# Patient Record
Sex: Female | Born: 1945 | Race: White | Hispanic: No | Smoking: Current every day smoker
Health system: Southern US, Community
[De-identification: ages and names within clinical notes are randomized; demographics above are authoritative.]

## PROBLEM LIST (undated history)

## (undated) DIAGNOSIS — G4700 Insomnia, unspecified: Secondary | ICD-10-CM

## (undated) DIAGNOSIS — K219 Gastro-esophageal reflux disease without esophagitis: Secondary | ICD-10-CM

## (undated) DIAGNOSIS — E079 Disorder of thyroid, unspecified: Secondary | ICD-10-CM

## (undated) DIAGNOSIS — E785 Hyperlipidemia, unspecified: Secondary | ICD-10-CM

## (undated) DIAGNOSIS — M810 Age-related osteoporosis without current pathological fracture: Secondary | ICD-10-CM

## (undated) DIAGNOSIS — R32 Unspecified urinary incontinence: Secondary | ICD-10-CM

## (undated) DIAGNOSIS — I10 Essential (primary) hypertension: Secondary | ICD-10-CM

## (undated) DIAGNOSIS — R55 Syncope and collapse: Secondary | ICD-10-CM

## (undated) DIAGNOSIS — I2510 Atherosclerotic heart disease of native coronary artery without angina pectoris: Secondary | ICD-10-CM

## (undated) HISTORY — DX: Atherosclerotic heart disease of native coronary artery without angina pectoris: I25.10

## (undated) HISTORY — DX: Essential (primary) hypertension: I10

## (undated) HISTORY — DX: Unspecified urinary incontinence: R32

## (undated) HISTORY — PX: OTHER SURGICAL HISTORY: SHX169

## (undated) HISTORY — DX: Insomnia, unspecified: G47.00

## (undated) HISTORY — DX: Syncope and collapse: R55

## (undated) HISTORY — DX: Hyperlipidemia, unspecified: E78.5

## (undated) HISTORY — DX: Age-related osteoporosis without current pathological fracture: M81.0

## (undated) HISTORY — PX: ABDOMINAL HYSTERECTOMY: SHX81

## (undated) HISTORY — DX: Disorder of thyroid, unspecified: E07.9

## (undated) HISTORY — PX: VESICOVAGINAL FISTULA CLOSURE W/ TAH: SUR271

## (undated) HISTORY — DX: Gastro-esophageal reflux disease without esophagitis: K21.9

## (undated) HISTORY — PX: THYROID SURGERY: SHX805

---

## 1999-05-09 ENCOUNTER — Other Ambulatory Visit: Admission: RE | Admit: 1999-05-09 | Discharge: 1999-05-09 | Payer: Self-pay | Admitting: Family Medicine

## 2000-11-19 ENCOUNTER — Other Ambulatory Visit: Admission: RE | Admit: 2000-11-19 | Discharge: 2000-11-19 | Payer: Self-pay | Admitting: Family Medicine

## 2001-03-28 ENCOUNTER — Ambulatory Visit (HOSPITAL_COMMUNITY): Admission: RE | Admit: 2001-03-28 | Discharge: 2001-03-28 | Payer: Self-pay | Admitting: Family Medicine

## 2001-03-28 ENCOUNTER — Encounter: Payer: Self-pay | Admitting: Family Medicine

## 2002-05-29 ENCOUNTER — Other Ambulatory Visit: Admission: RE | Admit: 2002-05-29 | Discharge: 2002-05-29 | Payer: Self-pay | Admitting: Family Medicine

## 2002-10-01 ENCOUNTER — Encounter: Payer: Self-pay | Admitting: Orthopedic Surgery

## 2002-10-01 ENCOUNTER — Encounter: Admission: RE | Admit: 2002-10-01 | Discharge: 2002-10-01 | Payer: Self-pay | Admitting: Orthopedic Surgery

## 2005-05-29 ENCOUNTER — Ambulatory Visit: Payer: Self-pay | Admitting: Cardiology

## 2005-05-31 ENCOUNTER — Ambulatory Visit: Payer: Self-pay | Admitting: Cardiology

## 2005-06-11 ENCOUNTER — Ambulatory Visit: Payer: Self-pay | Admitting: Internal Medicine

## 2005-06-19 ENCOUNTER — Ambulatory Visit: Payer: Self-pay | Admitting: Internal Medicine

## 2005-06-19 ENCOUNTER — Ambulatory Visit (HOSPITAL_COMMUNITY): Admission: RE | Admit: 2005-06-19 | Discharge: 2005-06-19 | Payer: Self-pay | Admitting: Internal Medicine

## 2005-07-11 ENCOUNTER — Ambulatory Visit: Payer: Self-pay | Admitting: Cardiology

## 2006-11-13 ENCOUNTER — Encounter: Admission: RE | Admit: 2006-11-13 | Discharge: 2006-11-13 | Payer: Self-pay | Admitting: Family Medicine

## 2006-11-19 ENCOUNTER — Other Ambulatory Visit: Admission: RE | Admit: 2006-11-19 | Discharge: 2006-11-19 | Payer: Self-pay | Admitting: Family Medicine

## 2010-11-17 NOTE — Cardiovascular Report (Signed)
Kristen, Munoz                  ACCOUNT NO.:  [id number]   MEDICAL RECORD NO.:  [id number]          PATIENT TYPE:  OIB   LOCATION:  2899                         FACILITY:  [facility]   PHYSICIAN:  Arvilla Meres, M.D. LHCDATE OF BIRTH:  Aug 09, 1945   DATE OF PROCEDURE:  06/19/2005  DATE OF DISCHARGE:  06/19/2005                              CARDIAC CATHETERIZATION   PRIMARY CARE PHYSICIAN:  Dr. Rudi Heap.   CARDIOLOGISTOccupational hygienist in [place].   PATIENT IDENTIFICATION:  Kristen Munoz is a 65 year old woman with a history of  hyperlipidemia and ongoing tobacco use as well as strong family history of  coronary artery disease who was seen in the [facility] office for episodes  of chest pain related to emotional stress. She does get some shortness of  breath with exertion but does not get chest pain with exertion. Typically  chest pain episodes can last up to 24 hours at a time. She underwent  Cardiolite which showed a question of an ischemic defect in the inferior  wall and she is thus referred for cardiac catheterization.   PROCEDURES PERFORMED:  1.  Selective coronary angiography.  2.  Left heart catheterization.  3.  Left ventriculogram.   DESCRIPTION OF PROCEDURE:  The risks and benefits of the catheterization  explained to Ms. Greer; consent was signed and placed on the chart. A 6-  [place] arterial sheath was placed in the right femoral artery using a  modified Seldinger technique. Standard catheters including preformed Judkins  JL-4, JR-4 and angled pigtail were used for the procedure. All catheter  exchanges made over wire. There were no apparent complications.   Central aortic pressure was 122/67 with a mean of 91. LV pressure is 121/6  with an LVEDP of 15. There was no aortic stenosis.   Left main was normal.   LAD was a long vessel that wrapped the apex. It gave off a very large  branching diagonal on the proximal portion. In the LAD proper, there was a  30%  stenosis in the proximal portion before the take off the diagonal. In  the mid-section, there was a 50-60% focal stenosis which was not flow-  limiting.   Left circumflex was a small system made up primarily of an OM-1. There was  40% focal stenosis in the OM-1.   Right coronary artery was a very large dominant vessel. It gave off a right  ventricular branch, moderate-to-large PDA and 5 or 6 small to moderate PL's.  There was a 30% lesion in the mid-section of the RCA; otherwise no  significant angiographic CAD.   Left ventriculogram done in the RAO approach showed an EF of 55-60% with no  focal wall motion abnormalities. There was no mitral regurgitation.   ASSESSMENT:  1.  Nonobstructive coronary disease.  2.  Normal left ventricular function.   PLAN/DISCUSSION:  She will need aggressive risk factor management including  smoking cessation. We will start her on simvastatin 40 milligrams a day. She  will follow back up in the [place] office. She will likely be able to be  discharged home today after her bedrest.      Arvilla Meres, M.D. [facility]  Electronically Signed     DB/MEDQ  D:  06/19/2005  T:  06/20/2005  Job:  [id number]   cc:   Ernestina Penna, M.D.  Fax: [phone]   [facility]  [place], [place]

## 2012-09-14 ENCOUNTER — Other Ambulatory Visit: Payer: Self-pay | Admitting: *Deleted

## 2012-09-14 DIAGNOSIS — M810 Age-related osteoporosis without current pathological fracture: Secondary | ICD-10-CM

## 2012-09-29 ENCOUNTER — Other Ambulatory Visit: Payer: Self-pay | Admitting: Nurse Practitioner

## 2012-09-29 DIAGNOSIS — R928 Other abnormal and inconclusive findings on diagnostic imaging of breast: Secondary | ICD-10-CM

## 2012-10-29 ENCOUNTER — Ambulatory Visit (INDEPENDENT_AMBULATORY_CARE_PROVIDER_SITE_OTHER): Payer: Medicare Other

## 2012-10-29 ENCOUNTER — Ambulatory Visit (INDEPENDENT_AMBULATORY_CARE_PROVIDER_SITE_OTHER): Payer: Medicare Other | Admitting: Pharmacist

## 2012-10-29 VITALS — Ht 63.0 in | Wt 152.0 lb

## 2012-10-29 DIAGNOSIS — F32A Depression, unspecified: Secondary | ICD-10-CM | POA: Insufficient documentation

## 2012-10-29 DIAGNOSIS — E039 Hypothyroidism, unspecified: Secondary | ICD-10-CM

## 2012-10-29 DIAGNOSIS — M810 Age-related osteoporosis without current pathological fracture: Secondary | ICD-10-CM

## 2012-10-29 DIAGNOSIS — I10 Essential (primary) hypertension: Secondary | ICD-10-CM | POA: Insufficient documentation

## 2012-10-29 DIAGNOSIS — E785 Hyperlipidemia, unspecified: Secondary | ICD-10-CM | POA: Insufficient documentation

## 2012-10-29 DIAGNOSIS — K219 Gastro-esophageal reflux disease without esophagitis: Secondary | ICD-10-CM | POA: Insufficient documentation

## 2012-10-29 DIAGNOSIS — I2510 Atherosclerotic heart disease of native coronary artery without angina pectoris: Secondary | ICD-10-CM | POA: Insufficient documentation

## 2012-10-29 DIAGNOSIS — N393 Stress incontinence (female) (male): Secondary | ICD-10-CM

## 2012-10-29 DIAGNOSIS — F329 Major depressive disorder, single episode, unspecified: Secondary | ICD-10-CM

## 2012-10-29 NOTE — Progress Notes (Signed)
Patient ID: Kristen Munoz, female   DOB: 25-Mar-1946, 67 y.o.   MRN: [id number]  [facility] Current Height: Height: 5\' 3"  (160 cm)      Max Lifetime Height:  5' 3.5" Current Weight: Weight: 152 lb (68.947 kg)       Body mass index is 26.93 kg/(m^2).    Ethnicity:Caucasian    HPI: Does pt already have a diagnosis of:  Osteopenia?  No Osteoporosis?  Yes  Back Pain?  Yes       Kyphosis?  No Prior fracture?  No Med(s) for Osteoporosis/Osteopenia:  none Med(s) previously tried for Osteoporosis/Osteopenia:  Calcium - severe constipation,                                         Fosamax - constipation                                        Boniva 150mg  q month - worsened GERD                                                              PMH: Age at menopause:  Surgical in 1976 Hysterectomy?  Yes Oophorectomy?  No HRT? Yes - Former.  Type/duration: estrogen Steroid Use?  No Thyroid med?  Yes History of cancer?  No  History of digestive disorders (ie Crohn's)?  Yes - GERD, currently taking PPI Current or previous eating disorders?  No Last Vitamin D Result:  32.4 (06/210) Last GFR Result:  78 (01/15/214)   FH/SH: Family history of osteoporosis?  Yes - mother Parent with history of hip fracture?  No Family history of breast cancer?  No Exercise?  Yes -walking 1 -2 days per week Smoking?  Yes  - has tried gum and patches to quit - successful for a few months but then restarted.  Alcohol?  No    Calcium Assessment Calcium Intake  # of servings/day  Calcium mg  Milk (8 oz) 0  x  300  = 0  Yogurt (4 oz) 0.5 x  200 = 100mg   Cheese (1 oz) 1 x  200 = 200mg   Other Calcium sources   250mg   Ca supplement none = 0   Estimated calcium intake per day 550mg     DEXA Results Date of Test T-Score for AP Spine L1-L4 T-Score for Total Left Hip T-Score for Total Right Hip  10/29/2012 -1.8 -1.9 -2.0  12/15/2008 -2.5 -1.7 -1.8  10/14/2006 -2.6 -1.9 -2.1  02/24/1998 -2.0 -1.5 --       Assessment: Osteoporosis with improved BMD of spine but decrease in both hips.  Non compliant with medication due to side effects.  High fracture risk and low calcium intake  Recommendations: 1.  Start  Prolia 60mg  SQ q 6 months - checking into insurance coverage - pending 2.  recommend calcium 1200mg  daily either through supplementation   or diet.   3.  recommend weight bearing exercise - 30 minutes at least 4 days   per week.   4.  Counseled and educated about fall risk and prevention. 5.  Discussed  smoking cessation - patient is not currently prepared to quit but we did discuss come option for helping quit.  Recheck DEXA:  2 years  Time spent counseling patient:  30 minutes

## 2012-10-29 NOTE — Patient Instructions (Addendum)

## 2012-11-26 ENCOUNTER — Telehealth: Payer: Self-pay

## 2012-11-26 NOTE — Telephone Encounter (Signed)
Insurance verification was sent to [employer] - they said prolia not covered by patient's plan.  I have discussed with Rhonda Case in our insurance dept and she is checking on coverage.  Patient notified to give Kristen Munoz 1 more week to see if Prolia is covered.

## 2012-11-26 NOTE — Telephone Encounter (Signed)
Patient said she was expecting a call back whether she is able to get shots for her bones regarding her Dexascan

## 2012-11-27 ENCOUNTER — Other Ambulatory Visit: Payer: Self-pay | Admitting: Family Medicine

## 2012-11-27 DIAGNOSIS — R921 Mammographic calcification found on diagnostic imaging of breast: Secondary | ICD-10-CM

## 2012-12-22 ENCOUNTER — Ambulatory Visit
Admission: RE | Admit: 2012-12-22 | Discharge: 2012-12-22 | Disposition: A | Discharge status: Home / Self Care | Payer: Medicare Other | Source: Ambulatory Visit | Attending: Family Medicine | Admitting: Family Medicine

## 2012-12-22 DIAGNOSIS — R921 Mammographic calcification found on diagnostic imaging of breast: Secondary | ICD-10-CM

## 2012-12-24 ENCOUNTER — Telehealth: Payer: Self-pay | Admitting: Pharmacist

## 2012-12-24 NOTE — Telephone Encounter (Signed)
Per [facility] (ref [phone]) cost of prolia will be 20% of cost plus office visit copay.  Patient notified and wants to proceed with Prolia.

## 2012-12-25 NOTE — Telephone Encounter (Signed)
Prolia ordered I will let you know when it comes in. 

## 2012-12-29 ENCOUNTER — Other Ambulatory Visit (INDEPENDENT_AMBULATORY_CARE_PROVIDER_SITE_OTHER): Payer: Medicare Other | Admitting: Pharmacist

## 2012-12-29 ENCOUNTER — Encounter: Payer: Self-pay | Admitting: Nurse Practitioner

## 2012-12-29 ENCOUNTER — Ambulatory Visit (INDEPENDENT_AMBULATORY_CARE_PROVIDER_SITE_OTHER): Payer: Medicare Other | Admitting: Nurse Practitioner

## 2012-12-29 VITALS — BP 113/72 | HR 68 | Temp 97.1°F | Ht 63.0 in | Wt 150.0 lb

## 2012-12-29 DIAGNOSIS — M810 Age-related osteoporosis without current pathological fracture: Secondary | ICD-10-CM

## 2012-12-29 DIAGNOSIS — I10 Essential (primary) hypertension: Secondary | ICD-10-CM

## 2012-12-29 DIAGNOSIS — F329 Major depressive disorder, single episode, unspecified: Secondary | ICD-10-CM

## 2012-12-29 DIAGNOSIS — K219 Gastro-esophageal reflux disease without esophagitis: Secondary | ICD-10-CM

## 2012-12-29 DIAGNOSIS — I2510 Atherosclerotic heart disease of native coronary artery without angina pectoris: Secondary | ICD-10-CM

## 2012-12-29 DIAGNOSIS — E039 Hypothyroidism, unspecified: Secondary | ICD-10-CM

## 2012-12-29 DIAGNOSIS — E785 Hyperlipidemia, unspecified: Secondary | ICD-10-CM

## 2012-12-29 MED ORDER — DENOSUMAB 60 MG/ML ~~LOC~~ SOLN
60.0000 mg | Freq: Once | SUBCUTANEOUS | Status: AC
  Administered 2012-12-29: 60 mg via SUBCUTANEOUS

## 2012-12-29 NOTE — Progress Notes (Signed)
Subjective:    Patient ID: Kristen Munoz, female    DOB: July 26, 1945, 67 y.o.   MRN: [id number]  Hyperlipidemia This is a chronic problem. The current episode started more than 1 year ago. The problem is controlled. Recent lipid tests were reviewed and are normal. There are no known factors aggravating her hyperlipidemia. Pertinent negatives include no chest pain, focal weakness, leg pain, myalgias or shortness of breath. Current antihyperlipidemic treatment includes statins. The current treatment provides moderate improvement of lipids. Risk factors for coronary artery disease include hypertension and post-menopausal.  Hypertension This is a chronic problem. The current episode started more than 1 year ago. The problem has been resolved since onset. The problem is controlled. Pertinent negatives include no chest pain, malaise/fatigue, neck pain, orthopnea, palpitations, peripheral edema, shortness of breath or sweats. There are no associated agents to hypertension. Risk factors for coronary artery disease include post-menopausal state. Past treatments include ACE inhibitors and diuretics. The current treatment provides significant improvement. There are no compliance problems.  Hypertensive end-organ damage includes a thyroid problem.  Thyroid Problem Presents for follow-up (hypothyroidism) visit. Patient reports no anxiety, depressed mood, diaphoresis, diarrhea, dry skin, menstrual problem, palpitations or weight gain. The symptoms have been stable. Her past medical history is significant for hyperlipidemia.  Depression Zoloft working well without side effects- Keeps her calm and from worrying so much GERD Zegrid- works well - keeps symptoms under control Osteoporosis Suppose to get a prolia injection today   Review of Systems  Constitutional: Negative for weight gain, malaise/fatigue and diaphoresis.  HENT: Negative for neck pain.   Respiratory: Negative for shortness of breath.    Cardiovascular: Negative for chest pain, palpitations and orthopnea.  Gastrointestinal: Negative for diarrhea.  Genitourinary: Negative for menstrual problem.  Musculoskeletal: Negative for myalgias.  Neurological: Negative for focal weakness.  All other systems reviewed and are negative.       Objective:   Physical Exam  Constitutional: She is oriented to person, place, and time. She appears well-developed and well-nourished.  HENT:  Nose: Nose normal.  Mouth/Throat: Oropharynx is clear and moist.  Eyes: EOM are normal.  Neck: Trachea normal, normal range of motion and full passive range of motion without pain. Neck supple. No JVD present. Carotid bruit is not present. No thyromegaly present.  Cardiovascular: Normal rate, regular rhythm, normal heart sounds and intact distal pulses.  Exam reveals no gallop and no friction rub.   No murmur heard. Pulmonary/Chest: Effort normal and breath sounds normal.  Abdominal: Soft. Bowel sounds are normal. She exhibits no distension and no mass. There is no tenderness.  Musculoskeletal: Normal range of motion.  Lymphadenopathy:    She has no cervical adenopathy.  Neurological: She is alert and oriented to person, place, and time. She has normal reflexes.  Skin: Skin is warm and dry.  Psychiatric: She has a normal mood and affect. Her behavior is normal. Judgment and thought content normal.    BP 113/72  Pulse 68  Temp(Src) 97.1 F (36.2 C) (Oral)  Ht 5\' 3"  (1.6 m)  Wt 150 lb (68.04 kg)  BMI 26.58 kg/m2       Assessment & Plan:   1. HTN (hypertension)   2. Other and unspecified hyperlipidemia   3. Unspecified hypothyroidism   4. Osteoporosis   5. Depression   6. CAD (coronary artery disease)   7. GERD (gastroesophageal reflux disease)    Orders Placed This Encounter  Procedures  . Thyroid Panel With TSH  . NMR  Lipoprofile with Lipids  . COMPLETE METABOLIC PANEL WITH GFR   Outpatient Encounter Prescriptions as of  12/29/2012  Medication Sig Dispense Refill  . aspirin EC 81 MG tablet Take 81 mg by mouth daily.      . cholecalciferol (VITAMIN D) 1000 UNITS tablet Take 1,000 Units by mouth daily.      Marland Kitchen levothyroxine (SYNTHROID, LEVOTHROID) 25 MCG tablet Take 25 mcg by mouth daily before breakfast.      . lisinopril-hydrochlorothiazide (PRINZIDE,ZESTORETIC) 10-12.5 MG per tablet Take 1 tablet by mouth daily.      Marland Kitchen omeprazole-sodium bicarbonate (ZEGERID) 40-1100 MG per capsule Take 1 capsule by mouth daily.      . sertraline (ZOLOFT) 100 MG tablet Take 100 mg by mouth daily.      . simvastatin (ZOCOR) 40 MG tablet Take 40 mg by mouth every evening.      . vitamin B-12 (CYANOCOBALAMIN) 1000 MCG tablet Take 1,000 mcg by mouth daily.      . [DISCONTINUED] esomeprazole (NEXIUM) 40 MG capsule Take 40 mg by mouth daily before breakfast.       No facility-administered encounter medications on file as of 12/29/2012.   Continue all meds Labs pending Diet and exercise discussed Follow up in 3 months  Mary-Margaret Daphine Deutscher, FNP

## 2012-12-29 NOTE — Telephone Encounter (Signed)
Prolia in the lab refrigerator

## 2012-12-29 NOTE — Patient Instructions (Signed)

## 2012-12-30 LAB — COMPLETE METABOLIC PANEL WITH GFR
ALT: 9 U/L (ref 0–35)
Alkaline Phosphatase: 83 U/L (ref 39–117)
CO2: 28 mEq/L (ref 19–32)
Creat: 0.9 mg/dL (ref 0.50–1.10)
GFR, Est African American: 77 mL/min
Sodium: 140 mEq/L (ref 135–145)
Total Bilirubin: 0.4 mg/dL (ref 0.3–1.2)
Total Protein: 7.2 g/dL (ref 6.0–8.3)

## 2012-12-30 LAB — THYROID PANEL WITH TSH
Free Thyroxine Index: 2.9 (ref 1.0–3.9)
T3 Uptake: 29.5 % (ref 22.5–37.0)
TSH: 1.187 u[IU]/mL (ref 0.350–4.500)

## 2012-12-31 LAB — NMR LIPOPROFILE WITH LIPIDS
HDL Particle Number: 32.2 umol/L (ref >=30.5)
HDL Size: 9.1 nm — ABNORMAL LOW (ref >=9.2)
HDL-C: 53 mg/dL (ref >=40)
LDL (calc): 91 mg/dL (ref <100)
LDL Particle Number: 1460 nmol/L — ABNORMAL HIGH (ref <1000)
LDL Size: 20.9 nm (ref >20.5)
LP-IR Score: 50 — ABNORMAL HIGH (ref <=45)
Small LDL Particle Number: 673 nmol/L — ABNORMAL HIGH (ref <=527)
VLDL Size: 45.3 nm (ref <=46.6)

## 2013-01-13 ENCOUNTER — Other Ambulatory Visit: Payer: Self-pay | Admitting: Nurse Practitioner

## 2013-01-21 ENCOUNTER — Ambulatory Visit (INDEPENDENT_AMBULATORY_CARE_PROVIDER_SITE_OTHER): Payer: Medicare Other

## 2013-01-21 ENCOUNTER — Encounter: Payer: Self-pay | Admitting: Family Medicine

## 2013-01-21 ENCOUNTER — Ambulatory Visit (INDEPENDENT_AMBULATORY_CARE_PROVIDER_SITE_OTHER): Payer: Medicare Other | Admitting: Family Medicine

## 2013-01-21 VITALS — BP 132/66 | HR 74 | Temp 98.0°F | Ht 63.0 in | Wt 152.0 lb

## 2013-01-21 DIAGNOSIS — M25511 Pain in right shoulder: Secondary | ICD-10-CM

## 2013-01-21 DIAGNOSIS — M25519 Pain in unspecified shoulder: Secondary | ICD-10-CM

## 2013-01-21 MED ORDER — MELOXICAM 7.5 MG PO TABS: 7.5000 mg | ORAL_TABLET | Freq: Every day | ORAL | Status: DC

## 2013-01-21 NOTE — Assessment & Plan Note (Addendum)
-   Plain film reviewed and no evidence of arthritis or acute process.   Rotator cuff tendonopathy w/ likely bursitis.  -significant relief after injection of 40mg  kenalog and 1% marcaine - home w/ exercises - Meloxicam x 2 wks. Renal fxn nml. Pt aware that may worsen GERD - exercises given and demonstrated

## 2013-01-21 NOTE — Progress Notes (Signed)
  Subjective:    Patient ID: Kristen Munoz, female    DOB: 1945/07/29, 67 y.o.   MRN: [id number]  Shoulder Pain  Associated symptoms include numbness. Pertinent negatives include no fever.    Shoulder pain: started several months ago. Can't sleep at night. Unable to lift a cup to her mouth. Popping and cracking. No swelling. advil 400 w/o relief. salonspas pads w/o benefit. Denies any recdent trauma. Occasional numbness and tingling down R arm. Shoulder pain worse w/ movement. Fairly constant.   Review of Systems  Constitutional: Negative for fever.  Respiratory: Negative for shortness of breath.   Cardiovascular: Negative for chest pain and leg swelling.  Gastrointestinal: Negative for abdominal pain and abdominal distention.  Musculoskeletal: Negative for joint swelling.       Shoulder pain  Neurological: Positive for numbness. Negative for light-headedness and headaches.       Objective:   Physical Exam  Constitutional: She is oriented to person, place, and time. She appears well-developed and well-nourished. No distress.  HENT:  Head: Normocephalic and atraumatic.  Eyes: EOM are normal. Pupils are equal, round, and reactive to light.  Neck: Normal range of motion. Neck supple.  Cardiovascular: Normal rate, normal heart sounds and intact distal pulses.  Exam reveals no gallop and no friction rub.   No murmur heard. Pulmonary/Chest: Effort normal and breath sounds normal. No respiratory distress.  Abdominal: Soft. She exhibits no distension.  Musculoskeletal:  ROM of R arm limited secondary to pain. R arm abduction to 150 degrees L to 180 degrees. Tender to palpation beneath the acromioclavicular joint. Hawkins positive. Empty can, lift off negative.   Neurological: She is alert and oriented to person, place, and time.  Skin: Skin is warm and dry. She is not diaphoretic.  Psychiatric: She has a normal mood and affect. Her behavior is normal.          Assessment & Plan:   67yo F w/ shoulder pain w/ likely osteoarthritic changes and deconditioning leading to rotator cuff injury and tendonopathy vs bursitis.  - R shoulder plain film - after lengthy discussion w pt and various treatment modalities, pt opting for steroid injection.  A steroid injection was performed at the R posterior shoulder joint using 1% plainMarcaine and 40 mg of Kenalog. Verbal consent was obtained prior to the procedure. The area was cleaned w/ betadine and alcohol prep pads. This was well tolerated.  - Plain film reviewed and no evidence of arthritis or acute process.   Rotator cuff tendonopathy w/ likely bursitis.  -significant relief after injection - home w/ exercises - Meloxicam x 2 wks - exercises given and demonstrated - precautions given - all questions answered  Shelly Flatten, MD Family Medicine PGY-3 01/21/2013, 4:37 PM

## 2013-01-21 NOTE — Patient Instructions (Addendum)
You have a likely rotator cuff injury and possible bursitis The injection should really work to reduce your pain Please start taking the meloxicam. This may make your reflux worse.  If you cannot tolerate the meloxicam, take ibuprofen instead Please start the exercises below.  Please come back in 4 weeks if it has not improved.   Impingement Syndrome, Rotator Cuff, Bursitis with Rehab Impingement syndrome is a condition that involves inflammation of the tendons of the rotator cuff and the subacromial bursa, that causes pain in the shoulder. The rotator cuff consists of four tendons and muscles that control much of the shoulder and upper arm function. The subacromial bursa is a fluid filled sac that helps reduce friction between the rotator cuff and one of the bones of the shoulder (acromion). Impingement syndrome is usually an overuse injury that causes swelling of the bursa (bursitis), swelling of the tendon (tendonitis), and/or a tear of the tendon (strain). Strains are classified into three categories. Grade 1 strains cause pain, but the tendon is not lengthened. Grade 2 strains include a lengthened ligament, due to the ligament being stretched or partially ruptured. With grade 2 strains there is still function, although the function may be decreased. Grade 3 strains include a complete tear of the tendon or muscle, and function is usually impaired. SYMPTOMS   Pain around the shoulder, often at the outer portion of the upper arm.  Pain that gets worse with shoulder function, especially when reaching overhead or lifting.  Sometimes, aching when not using the arm.  Pain that wakes you up at night.  Sometimes, tenderness, swelling, warmth, or redness over the affected area.  Loss of strength.  Limited motion of the shoulder, especially reaching behind the back (to the back pocket or to unhook bra) or across your body.  Crackling sound (crepitation) when moving the arm.  Biceps tendon pain  and inflammation (in the front of the shoulder). Worse when bending the elbow or lifting. CAUSES  Impingement syndrome is often an overuse injury, in which chronic (repetitive) motions cause the tendons or bursa to become inflamed. A strain occurs when a force is paced on the tendon or muscle that is greater than it can withstand. Common mechanisms of injury include: Stress from sudden increase in duration, frequency, or intensity of training.  Direct hit (trauma) to the shoulder.  Aging, erosion of the tendon with normal use.  Bony bump on shoulder (acromial spur). RISK INCREASES WITH:  Contact sports (football, wrestling, boxing).  Throwing sports (baseball, tennis, volleyball).  Weightlifting and bodybuilding.  Heavy labor.  Previous injury to the rotator cuff, including impingement.  Poor shoulder strength and flexibility.  Failure to warm up properly before activity.  Inadequate protective equipment.  Old age.  Bony bump on shoulder (acromial spur). PREVENTION   Warm up and stretch properly before activity.  Allow for adequate recovery between workouts.  Maintain physical fitness:  Strength, flexibility, and endurance.  Cardiovascular fitness.  Learn and use proper exercise technique. PROGNOSIS  If treated properly, impingement syndrome usually goes away within 6 weeks. Sometimes surgery is required.  RELATED COMPLICATIONS   Longer healing time if not properly treated, or if not given enough time to heal.  Recurring symptoms, that result in a chronic condition.  Shoulder stiffness, frozen shoulder, or loss of motion.  Rotator cuff tendon tear.  Recurring symptoms, especially if activity is resumed too soon, with overuse, with a direct blow, or when using poor technique. TREATMENT  Treatment first involves the  use of ice and medicine, to reduce pain and inflammation. The use of strengthening and stretching exercises may help reduce pain with activity.  These exercises may be performed at home or with a therapist. If non-surgical treatment is unsuccessful after more than 6 months, surgery may be advised. After surgery and rehabilitation, activity is usually possible in 3 months.  MEDICATION  If pain medicine is needed, nonsteroidal anti-inflammatory medicines (aspirin and ibuprofen), or other minor pain relievers (acetaminophen), are often advised.  Do not take pain medicine for 7 days before surgery.  Prescription pain relievers may be given, if your caregiver thinks they are needed. Use only as directed and only as much as you need.  Corticosteroid injections may be given by your caregiver. These injections should be reserved for the most serious cases, because they may only be given a certain number of times. HEAT AND COLD  Cold treatment (icing) should be applied for 10 to 15 minutes every 2 to 3 hours for inflammation and pain, and immediately after activity that aggravates your symptoms. Use ice packs or an ice massage.  Heat treatment may be used before performing stretching and strengthening activities prescribed by your caregiver, physical therapist, or athletic trainer. Use a heat pack or a warm water soak. SEEK MEDICAL CARE IF:   Symptoms get worse or do not improve in 4 to 6 weeks, despite treatment.  New, unexplained symptoms develop. (Drugs used in treatment may produce side effects.) EXERCISES  RANGE OF MOTION (ROM) AND STRETCHING EXERCISES - Impingement Syndrome (Rotator Cuff  Tendinitis, Bursitis) These exercises may help you when beginning to rehabilitate your injury. Your symptoms may go away with or without further involvement from your physician, physical therapist or athletic trainer. While completing these exercises, remember:   Restoring tissue flexibility helps normal motion to return to the joints. This allows healthier, less painful movement and activity.  An effective stretch should be held for at least 30  seconds.  A stretch should never be painful. You should only feel a gentle lengthening or release in the stretched tissue. STRETCH  Flexion, Standing  Stand with good posture. With an underhand grip on your right / left hand, and an overhand grip on the opposite hand, grasp a broomstick or cane so that your hands are a little more than shoulder width apart.  Keeping your right / left elbow straight and shoulder muscles relaxed, push the stick with your opposite hand, to raise your right / left arm in front of your body and then overhead. Raise your arm until you feel a stretch in your right / left shoulder, but before you have increased shoulder pain.  Try to avoid shrugging your right / left shoulder as your arm rises, by keeping your shoulder blade tucked down and toward your mid-back spine. Hold for __________ seconds.  Slowly return to the starting position. Repeat __________ times. Complete this exercise __________ times per day. STRETCH  Abduction, Supine  Lie on your back. With an underhand grip on your right / left hand and an overhand grip on the opposite hand, grasp a broomstick or cane so that your hands are a little more than shoulder width apart.  Keeping your right / left elbow straight and your shoulder muscles relaxed, push the stick with your opposite hand, to raise your right / left arm out to the side of your body and then overhead. Raise your arm until you feel a stretch in your right / left shoulder, but before you  have increased shoulder pain.  Try to avoid shrugging your right / left shoulder as your arm rises, by keeping your shoulder blade tucked down and toward your mid-back spine. Hold for __________ seconds.  Slowly return to the starting position. Repeat __________ times. Complete this exercise __________ times per day. ROM  Flexion, Active-Assisted  Lie on your back. You may bend your knees for comfort.  Grasp a broomstick or cane so your hands are about  shoulder width apart. Your right / left hand should grip the end of the stick, so that your hand is positioned "thumbs-up," as if you were about to shake hands.  Using your healthy arm to lead, raise your right / left arm overhead, until you feel a gentle stretch in your shoulder. Hold for __________ seconds.  Use the stick to assist in returning your right / left arm to its starting position. Repeat __________ times. Complete this exercise __________ times per day.  ROM - Internal Rotation, Supine   Lie on your back on a firm surface. Place your right / left elbow about 60 degrees away from your side. Elevate your elbow with a folded towel, so that the elbow and shoulder are the same height.  Using a broomstick or cane and your strong arm, pull your right / left hand toward your body until you feel a gentle stretch, but no increase in your shoulder pain. Keep your shoulder and elbow in place throughout the exercise.  Hold for __________ seconds. Slowly return to the starting position. Repeat __________ times. Complete this exercise __________ times per day. STRETCH - Internal Rotation  Place your right / left hand behind your back, palm up.  Throw a towel or belt over your opposite shoulder. Grasp the towel with your right / left hand.  While keeping an upright posture, gently pull up on the towel, until you feel a stretch in the front of your right / left shoulder.  Avoid shrugging your right / left shoulder as your arm rises, by keeping your shoulder blade tucked down and toward your mid-back spine.  Hold for __________ seconds. Release the stretch, by lowering your healthy hand. Repeat __________ times. Complete this exercise __________ times per day. ROM - Internal Rotation   Using an underhand grip, grasp a stick behind your back with both hands.  While standing upright with good posture, slide the stick up your back until you feel a mild stretch in the front of your  shoulder.  Hold for __________ seconds. Slowly return to your starting position. Repeat __________ times. Complete this exercise __________ times per day.  STRETCH  Posterior Shoulder Capsule   Stand or sit with good posture. Grasp your right / left elbow and draw it across your chest, keeping it at the same height as your shoulder.  Pull your elbow, so your upper arm comes in closer to your chest. Pull until you feel a gentle stretch in the back of your shoulder.  Hold for __________ seconds. Repeat __________ times. Complete this exercise __________ times per day. STRENGTHENING EXERCISES - Impingement Syndrome (Rotator Cuff Tendinitis, Bursitis) These exercises may help you when beginning to rehabilitate your injury. They may resolve your symptoms with or without further involvement from your physician, physical therapist or athletic trainer. While completing these exercises, remember:  Muscles can gain both the endurance and the strength needed for everyday activities through controlled exercises.  Complete these exercises as instructed by your physician, physical therapist or athletic trainer. Increase the resistance  and repetitions only as guided.  You may experience muscle soreness or fatigue, but the pain or discomfort you are trying to eliminate should never worsen during these exercises. If this pain does get worse, stop and make sure you are following the directions exactly. If the pain is still present after adjustments, discontinue the exercise until you can discuss the trouble with your clinician.  During your recovery, avoid activity or exercises which involve actions that place your injured hand or elbow above your head or behind your back or head. These positions stress the tissues which you are trying to heal. STRENGTH - Scapular Depression and Adduction   With good posture, sit on a firm chair. Support your arms in front of you, with pillows, arm rests, or on a table top.  Have your elbows in line with the sides of your body.  Gently draw your shoulder blades down and toward your mid-back spine. Gradually increase the tension, without tensing the muscles along the top of your shoulders and the back of your neck.  Hold for __________ seconds. Slowly release the tension and relax your muscles completely before starting the next repetition.  After you have practiced this exercise, remove the arm support and complete the exercise in standing as well as sitting position. Repeat __________ times. Complete this exercise __________ times per day.  STRENGTH - Shoulder Abductors, Isometric  With good posture, stand or sit about 4-6 inches from a wall, with your right / left side facing the wall.  Bend your right / left elbow. Gently press your right / left elbow into the wall. Increase the pressure gradually, until you are pressing as hard as you can, without shrugging your shoulder or increasing any shoulder discomfort.  Hold for __________ seconds.  Release the tension slowly. Relax your shoulder muscles completely before you begin the next repetition. Repeat __________ times. Complete this exercise __________ times per day.  STRENGTH - External Rotators, Isometric  Keep your right / left elbow at your side and bend it 90 degrees.  Step into a door frame so that the outside of your right / left wrist can press against the door frame without your upper arm leaving your side.  Gently press your right / left wrist into the door frame, as if you were trying to swing the back of your hand away from your stomach. Gradually increase the tension, until you are pressing as hard as you can, without shrugging your shoulder or increasing any shoulder discomfort.  Hold for __________ seconds.  Release the tension slowly. Relax your shoulder muscles completely before you begin the next repetition. Repeat __________ times. Complete this exercise __________ times per day.   STRENGTH - Supraspinatus   Stand or sit with good posture. Grasp a __________ weight, or an exercise band or tubing, so that your hand is "thumbs-up," like you are shaking hands.  Slowly lift your right / left arm in a "V" away from your thigh, diagonally into the space between your side and straight ahead. Lift your hand to shoulder height or as far as you can, without increasing any shoulder pain. At first, many people do not lift their hands above shoulder height.  Avoid shrugging your right / left shoulder as your arm rises, by keeping your shoulder blade tucked down and toward your mid-back spine.  Hold for __________ seconds. Control the descent of your hand, as you slowly return to your starting position. Repeat __________ times. Complete this exercise __________ times per day.  STRENGTH - External Rotators  Secure a rubber exercise band or tubing to a fixed object (table, pole) so that it is at the same height as your right / left elbow when you are standing or sitting on a firm surface.  Stand or sit so that the secured exercise band is at your uninjured side.  Bend your right / left elbow 90 degrees. Place a folded towel or small pillow under your right / left arm, so that your elbow is a few inches away from your side.  Keeping the tension on the exercise band, pull it away from your body, as if pivoting on your elbow. Be sure to keep your body steady, so that the movement is coming only from your rotating shoulder.  Hold for __________ seconds. Release the tension in a controlled manner, as you return to the starting position. Repeat __________ times. Complete this exercise __________ times per day.  STRENGTH - Internal Rotators   Secure a rubber exercise band or tubing to a fixed object (table, pole) so that it is at the same height as your right / left elbow when you are standing or sitting on a firm surface.  Stand or sit so that the secured exercise band is at your right /  left side.  Bend your elbow 90 degrees. Place a folded towel or small pillow under your right / left arm so that your elbow is a few inches away from your side.  Keeping the tension on the exercise band, pull it across your body, toward your stomach. Be sure to keep your body steady, so that the movement is coming only from your rotating shoulder.  Hold for __________ seconds. Release the tension in a controlled manner, as you return to the starting position. Repeat __________ times. Complete this exercise __________ times per day.  STRENGTH - Scapular Protractors, Standing   Stand arms length away from a wall. Place your hands on the wall, keeping your elbows straight.  Begin by dropping your shoulder blades down and toward your mid-back spine.  To strengthen your protractors, keep your shoulder blades down, but slide them forward on your rib cage. It will feel as if you are lifting the back of your rib cage away from the wall. This is a subtle motion and can be challenging to complete. Ask your caregiver for further instruction, if you are not sure you are doing the exercise correctly.  Hold for __________ seconds. Slowly return to the starting position, resting the muscles completely before starting the next repetition. Repeat __________ times. Complete this exercise __________ times per day. STRENGTH - Scapular Protractors, Supine  Lie on your back on a firm surface. Extend your right / left arm straight into the air while holding a __________ weight in your hand.  Keeping your head and back in place, lift your shoulder off the floor.  Hold for __________ seconds. Slowly return to the starting position, and allow your muscles to relax completely before starting the next repetition. Repeat __________ times. Complete this exercise __________ times per day. STRENGTH - Scapular Protractors, Quadruped  Get onto your hands and knees, with your shoulders directly over your hands (or as close  as you can be, comfortably).  Keeping your elbows locked, lift the back of your rib cage up into your shoulder blades, so your mid-back rounds out. Keep your neck muscles relaxed.  Hold this position for __________ seconds. Slowly return to the starting position and allow your muscles to relax completely  before starting the next repetition. Repeat __________ times. Complete this exercise __________ times per day.  STRENGTH - Scapular Retractors  Secure a rubber exercise band or tubing to a fixed object (table, pole), so that it is at the height of your shoulders when you are either standing, or sitting on a firm armless chair.  With a palm down grip, grasp an end of the band in each hand. Straighten your elbows and lift your hands straight in front of you, at shoulder height. Step back, away from the secured end of the band, until it becomes tense.  Squeezing your shoulder blades together, draw your elbows back toward your sides, as you bend them. Keep your upper arms lifted away from your body throughout the exercise.  Hold for __________ seconds. Slowly ease the tension on the band, as you reverse the directions and return to the starting position. Repeat __________ times. Complete this exercise __________ times per day. STRENGTH - Shoulder Extensors   Secure a rubber exercise band or tubing to a fixed object (table, pole) so that it is at the height of your shoulders when you are either standing, or sitting on a firm armless chair.  With a thumbs-up grip, grasp an end of the band in each hand. Straighten your elbows and lift your hands straight in front of you, at shoulder height. Step back, away from the secured end of the band, until it becomes tense.  Squeezing your shoulder blades together, pull your hands down to the sides of your thighs. Do not allow your hands to go behind you.  Hold for __________ seconds. Slowly ease the tension on the band, as you reverse the directions and  return to the starting position. Repeat __________ times. Complete this exercise __________ times per day.  STRENGTH - Scapular Retractors and External Rotators   Secure a rubber exercise band or tubing to a fixed object (table, pole) so that it is at the height as your shoulders, when you are either standing, or sitting on a firm armless chair.  With a palm down grip, grasp an end of the band in each hand. Bend your elbows 90 degrees and lift your elbows to shoulder height, at your sides. Step back, away from the secured end of the band, until it becomes tense.  Squeezing your shoulder blades together, rotate your shoulders so that your upper arms and elbows remain stationary, but your fists travel upward to head height.  Hold for __________ seconds. Slowly ease the tension on the band, as you reverse the directions and return to the starting position. Repeat __________ times. Complete this exercise __________ times per day.  STRENGTH - Scapular Retractors and External Rotators, Rowing   Secure a rubber exercise band or tubing to a fixed object (table, pole) so that it is at the height of your shoulders, when you are either standing, or sitting on a firm armless chair.  With a palm down grip, grasp an end of the band in each hand. Straighten your elbows and lift your hands straight in front of you, at shoulder height. Step back, away from the secured end of the band, until it becomes tense.  Step 1: Squeeze your shoulder blades together. Bending your elbows, draw your hands to your chest, as if you are rowing a boat. At the end of this motion, your hands and elbow should be at shoulder height and your elbows should be out to your sides.  Step 2: Rotate your shoulders, to raise your  hands above your head. Your forearms should be vertical and your upper arms should be horizontal.  Hold for __________ seconds. Slowly ease the tension on the band, as you reverse the directions and return to the  starting position. Repeat __________ times. Complete this exercise __________ times per day.  STRENGTH  Scapular Depressors  Find a sturdy chair without wheels, such as a dining room chair.  Keeping your feet on the floor, and your hands on the chair arms, lift your bottom up from the seat, and lock your elbows.  Keeping your elbows straight, allow gravity to pull your body weight down. Your shoulders will rise toward your ears.  Raise your body against gravity by drawing your shoulder blades down your back, shortening the distance between your shoulders and ears. Although your feet should always maintain contact with the floor, your feet should progressively support less body weight, as you get stronger.  Hold for __________ seconds. In a controlled and slow manner, lower your body weight to begin the next repetition. Repeat __________ times. Complete this exercise __________ times per day.  Document Released: 06/18/2005 Document Revised: 09/10/2011 Document Reviewed: 09/30/2008 [facility] Patient Information 2014 [place], [place].

## 2013-04-22 ENCOUNTER — Ambulatory Visit (INDEPENDENT_AMBULATORY_CARE_PROVIDER_SITE_OTHER): Payer: Medicare Other | Admitting: General Practice

## 2013-04-22 ENCOUNTER — Encounter: Payer: Self-pay | Admitting: General Practice

## 2013-04-22 VITALS — BP 145/80 | HR 76 | Temp 97.6°F | Ht 63.0 in | Wt 152.0 lb

## 2013-04-22 DIAGNOSIS — M62838 Other muscle spasm: Secondary | ICD-10-CM

## 2013-04-22 DIAGNOSIS — Z23 Encounter for immunization: Secondary | ICD-10-CM

## 2013-04-22 DIAGNOSIS — M25511 Pain in right shoulder: Secondary | ICD-10-CM

## 2013-04-22 DIAGNOSIS — M25519 Pain in unspecified shoulder: Secondary | ICD-10-CM

## 2013-04-22 MED ORDER — NAPROXEN 500 MG PO TABS: 500.0000 mg | ORAL_TABLET | Freq: Two times a day (BID) | ORAL | Status: DC

## 2013-04-22 MED ORDER — CYCLOBENZAPRINE HCL 10 MG PO TABS: 10.0000 mg | ORAL_TABLET | Freq: Two times a day (BID) | ORAL | Status: DC | PRN

## 2013-04-22 NOTE — Progress Notes (Signed)
  Subjective:    Patient ID: Kristen Munoz, female    DOB: 14-Sep-1945, 67 y.o.   MRN: [id number]  HPI Patient presents today with recurrent right shoulder pain. Reports taking mobic in the past without relief. Reports pain has gradually worsened. Denies warm compresses.     Review of Systems  Constitutional: Negative for fever and chills.  Respiratory: Negative for chest tightness and shortness of breath.   Cardiovascular: Negative for chest pain and palpitations.  Musculoskeletal:       Right shoulder and neck pain  Neurological: Negative for dizziness, weakness and headaches.       Objective:   Physical Exam  Constitutional: She is oriented to person, place, and time. She appears well-developed and well-nourished.  Cardiovascular: Normal rate and normal heart sounds.   Pulmonary/Chest: Effort normal and breath sounds normal. No respiratory distress. She exhibits no tenderness.  Musculoskeletal: She exhibits tenderness.  Right trapezius tightness and tenderness. Limit range of motion without pain (45 degree max, with minimal pain)  Neurological: She is alert and oriented to person, place, and time.  Skin: Skin is warm and dry.  Psychiatric: She has a normal mood and affect.          Assessment & Plan:  1. Muscle spasm of right shoulder  - cyclobenzaprine (FLEXERIL) 10 MG tablet; Take 1 tablet (10 mg total) by mouth 2 (two) times daily as needed for muscle spasms.  Dispense: 20 tablet; Refill: 0  2. Shoulder joint pain, right  - naproxen (NAPROSYN) 500 MG tablet; Take 1 tablet (500 mg total) by mouth 2 (two) times daily with a meal.  Dispense: 20 tablet; Refill: 0 -information sheets provided and discussed about joint pain -sedation precautions discussed -will refer to ortho if no improvement or worsening -Patient verbalized understanding Coralie Keens, FNP-C

## 2013-04-22 NOTE — Patient Instructions (Signed)
Shoulder Pain  The shoulder is the joint that connects your arms to your body. The bones that form the shoulder joint include the upper arm bone (humerus), the shoulder blade (scapula), and the collarbone (clavicle). The top of the humerus is shaped like a ball and fits into a rather flat socket on the scapula (glenoid cavity). A combination of muscles and strong, fibrous tissues that connect muscles to bones (tendons) support your shoulder joint and hold the ball in the socket. Small, fluid-filled sacs (bursae) are located in different areas of the joint. They act as cushions between the bones and the overlying soft tissues and help reduce friction between the gliding tendons and the bone as you move your arm. Your shoulder joint allows a wide range of motion in your arm. This range of motion allows you to do things like scratch your back or throw a ball. However, this range of motion also makes your shoulder more prone to pain from overuse and injury.  Causes of shoulder pain can originate from both injury and overuse and usually can be grouped in the following four categories:   Redness, swelling, and pain (inflammation) of the tendon (tendinitis) or the bursae (bursitis).   Instability, such as a dislocation of the joint.   Inflammation of the joint (arthritis).   Broken bone (fracture).  HOME CARE INSTRUCTIONS    Apply ice to the sore area.   Put ice in a plastic bag.   Place a towel between your skin and the bag.   Leave the ice on for 15-20 minutes, 3-4 times per day for the first 2 days.   Stop using cold packs if they do not help with the pain.   If you have a shoulder sling or immobilizer, wear it as long as your caregiver instructs. Only remove it to shower or bathe. Move your arm as little as possible, but keep your hand moving to prevent swelling.   Squeeze a soft ball or foam pad as much as possible to help prevent swelling.   Only take over-the-counter or prescription medicines for pain,  discomfort, or fever as directed by your caregiver.  SEEK MEDICAL CARE IF:    Your shoulder pain increases, or new pain develops in your arm, hand, or fingers.   Your hand or fingers become cold and numb.   Your pain is not relieved with medicines.  SEEK IMMEDIATE MEDICAL CARE IF:    Your arm, hand, or fingers are numb or tingling.   Your arm, hand, or fingers are significantly swollen or turn white or blue.  MAKE SURE YOU:    Understand these instructions.   Will watch your condition.   Will get help right away if you are not doing well or get worse.  Document Released: 03/28/2005 Document Revised: 03/12/2012 Document Reviewed: 06/02/2011  Exit[employer] Patient Information 2014 [employer].

## 2013-06-05 ENCOUNTER — Other Ambulatory Visit: Payer: Self-pay | Admitting: *Deleted

## 2013-06-05 MED ORDER — LEVOTHYROXINE SODIUM 25 MCG PO TABS: 25.0000 ug | ORAL_TABLET | Freq: Every day | ORAL | Status: DC

## 2013-06-18 ENCOUNTER — Other Ambulatory Visit: Payer: Self-pay | Admitting: General Practice

## 2013-06-18 ENCOUNTER — Other Ambulatory Visit: Payer: Self-pay | Admitting: Nurse Practitioner

## 2013-06-22 ENCOUNTER — Telehealth: Payer: Self-pay | Admitting: Pharmacist

## 2013-06-22 NOTE — Telephone Encounter (Signed)
Patient needs prolia injection - appt made for 07/03/13. Her daughter also asked about refills for sertraline nad synthroid.  Called [facility] and confirmed they are ready.  Patient's daughter notified.

## 2013-06-30 NOTE — Telephone Encounter (Signed)
Prolia ordered 06/30/2013

## 2013-07-03 ENCOUNTER — Ambulatory Visit (INDEPENDENT_AMBULATORY_CARE_PROVIDER_SITE_OTHER): Payer: Medicare HMO | Admitting: Pharmacist

## 2013-07-03 DIAGNOSIS — M810 Age-related osteoporosis without current pathological fracture: Secondary | ICD-10-CM

## 2013-07-03 NOTE — Progress Notes (Signed)
Prolia not given today. Patient's insurance has changed effective 07/02/13 to [employer].  According to representative that I spoke with Prolia is not covered by diagnosis code 733.00 because it is considered "experimental for this diagnosis"  I am not sure that I am getting correct information.  Tonya in our referral office if checking into coverage and pre authorization.   Henrene Pastorammy Maysin Carstens, PharmD, CPP

## 2013-07-06 ENCOUNTER — Ambulatory Visit: Payer: Self-pay

## 2013-07-23 ENCOUNTER — Telehealth: Payer: Self-pay | Admitting: Pharmacist

## 2013-07-23 NOTE — Telephone Encounter (Signed)
PRolia has been approved per Carren Rangonna Lawson 07/31/2013 thru 07/31/2014. Approval # [id number]  appt made for patient 08/04/13 - pt aware

## 2013-08-03 ENCOUNTER — Encounter (INDEPENDENT_AMBULATORY_CARE_PROVIDER_SITE_OTHER): Payer: Self-pay

## 2013-08-03 ENCOUNTER — Ambulatory Visit (INDEPENDENT_AMBULATORY_CARE_PROVIDER_SITE_OTHER): Payer: Medicare HMO | Admitting: Pharmacist

## 2013-08-03 DIAGNOSIS — M810 Age-related osteoporosis without current pathological fracture: Secondary | ICD-10-CM

## 2013-08-03 MED ORDER — DENOSUMAB 60 MG/ML ~~LOC~~ SOLN
60.0000 mg | Freq: Once | SUBCUTANEOUS | Status: AC
  Administered 2013-08-03: 60 mg via SUBCUTANEOUS

## 2013-08-03 NOTE — Progress Notes (Signed)
Patient ID: Kristen KotykDiana S Munoz, female   DOB: 1946/04/28, 68 y.o.   MRN: [id number] Patient ID: Kristen KotykDiana S Hilgers, female   DOB: 1946/04/28, 68 y.o.   MRN: [id number]  [facility]   HPI: Patient with osteoporosis has been getting Prolia 60mg  SQ q 6 months since 11/2012. Was due to get en d of 06/2013 but had delay due to insurance changes and prior authorization.  Back Pain?  Yes       Kyphosis?  No Prior fracture?  No Med(s) for Osteoporosis/Osteopenia:  Prolia 60mg  sq q 6 months Med(s) previously tried for Osteoporosis/Osteopenia:  Calcium - severe constipation,                                         Fosamax - constipation                                        Boniva 150mg  q month - worsened GERD                                                              PMH: Age at menopause:  Surgical in 1976 Hysterectomy?  Yes Oophorectomy?  No HRT? Yes - Former.  Type/duration: estrogen Steroid Use?  No Thyroid med?  Yes History of cancer?  No  History of digestive disorders (ie Crohn's)?  Yes - GERD, currently taking PPI Current or previous eating disorders?  No Last Vitamin D Result:  32.4 (06/210) Last GFR Result:  66 (12/29/2012)   FH/SH: Family history of osteoporosis?  Yes - mother Parent with history of hip fracture?  No Family history of breast cancer?  No Exercise?  Yes -walking 1 -2 days per week Smoking?  Yes  - has tried gum and patches to quit - successful for a few months but then restarted.  Alcohol?  No    Calcium Assessment Calcium Intake  # of servings/day  Calcium mg  Milk (8 oz) 0  x  300  = 0  Yogurt (4 oz) 0.5 x  200 = 100mg   Cheese (1 oz) 1 x  200 = 200mg   Other Calcium sources   250mg   Ca supplement Calcium 500mg  qd = 500mg    Estimated calcium intake per day 1050mg     DEXA Results Date of Test T-Score for AP Spine L1-L4 T-Score for Total Left Hip T-Score for Total Right Hip  10/29/2012 -1.8 -1.9 -2.0  12/15/2008 -2.5 -1.7 -1.8  10/14/2006 -2.6 -1.9 -2.1   02/24/1998 -2.0 -1.5 --      Assessment: Osteoporosis with improved BMD of spine but decrease in both hips.   High fracture risk and low calcium intake  Recommendations: 1.  Start  Prolia 60mg  SQ q 6 months - given today.  Next due 01/31/2014. 2.  recommend calcium 1200mg  daily either through supplementation   or diet.   3.  recommend weight bearing exercise - 30 minutes at least 4 days per week.   4.  Counseled and educated about fall risk and prevention.  Recheck DEXA:  2 years  Time spent counseling patient:  30 minutes  Cherre Robins, PharmD, CPP

## 2013-08-24 ENCOUNTER — Other Ambulatory Visit: Payer: Self-pay | Admitting: General Practice

## 2013-08-24 ENCOUNTER — Telehealth: Payer: Self-pay | Admitting: General Practice

## 2013-08-24 DIAGNOSIS — F411 Generalized anxiety disorder: Secondary | ICD-10-CM

## 2013-08-24 NOTE — Telephone Encounter (Signed)
Mae this is your patient

## 2013-08-25 ENCOUNTER — Other Ambulatory Visit: Payer: Self-pay | Admitting: General Practice

## 2013-08-25 DIAGNOSIS — F411 Generalized anxiety disorder: Secondary | ICD-10-CM

## 2013-08-25 MED ORDER — SERTRALINE HCL 100 MG PO TABS: 100.0000 mg | ORAL_TABLET | Freq: Every day | ORAL | Status: DC

## 2013-08-25 NOTE — Telephone Encounter (Signed)
PAtient aware 

## 2013-08-25 NOTE — Telephone Encounter (Signed)
Please inform zoloft script sent to pharmacy

## 2013-11-17 ENCOUNTER — Other Ambulatory Visit: Payer: Self-pay | Admitting: Nurse Practitioner

## 2013-12-21 ENCOUNTER — Other Ambulatory Visit: Payer: Self-pay | Admitting: Nurse Practitioner

## 2013-12-31 ENCOUNTER — Other Ambulatory Visit: Payer: Self-pay | Admitting: Nurse Practitioner

## 2014-02-01 ENCOUNTER — Telehealth: Payer: Self-pay | Admitting: Pharmacist

## 2014-02-01 MED ORDER — LISINOPRIL-HYDROCHLOROTHIAZIDE 10-12.5 MG PO TABS: 1.0000 | ORAL_TABLET | Freq: Every day | ORAL | Status: DC

## 2014-02-01 NOTE — Telephone Encounter (Signed)
Time for Prolia injection - called patiet and she also mentioned that she needs follow up for BP and medication refill.  She has been out of BP med since June.  Appt made for 02/05/14 for BP recheck if MMM.  Patient to also make appt for physical when she comes in 02/05/14.  Plan to administer Prolia 02/05/14 - I will not be here but Billee CashingWendy Wray will administer.

## 2014-02-05 ENCOUNTER — Ambulatory Visit (INDEPENDENT_AMBULATORY_CARE_PROVIDER_SITE_OTHER): Payer: Medicare HMO | Admitting: Nurse Practitioner

## 2014-02-05 ENCOUNTER — Encounter: Payer: Self-pay | Admitting: Nurse Practitioner

## 2014-02-05 VITALS — BP 148/80 | HR 88 | Temp 97.0°F | Ht 63.0 in | Wt 155.4 lb

## 2014-02-05 DIAGNOSIS — I10 Essential (primary) hypertension: Secondary | ICD-10-CM

## 2014-02-05 DIAGNOSIS — F3289 Other specified depressive episodes: Secondary | ICD-10-CM

## 2014-02-05 DIAGNOSIS — M810 Age-related osteoporosis without current pathological fracture: Secondary | ICD-10-CM

## 2014-02-05 DIAGNOSIS — N393 Stress incontinence (female) (male): Secondary | ICD-10-CM

## 2014-02-05 DIAGNOSIS — E785 Hyperlipidemia, unspecified: Secondary | ICD-10-CM

## 2014-02-05 DIAGNOSIS — K219 Gastro-esophageal reflux disease without esophagitis: Secondary | ICD-10-CM

## 2014-02-05 DIAGNOSIS — E039 Hypothyroidism, unspecified: Secondary | ICD-10-CM

## 2014-02-05 DIAGNOSIS — F32A Depression, unspecified: Secondary | ICD-10-CM

## 2014-02-05 DIAGNOSIS — F329 Major depressive disorder, single episode, unspecified: Secondary | ICD-10-CM

## 2014-02-05 DIAGNOSIS — I2510 Atherosclerotic heart disease of native coronary artery without angina pectoris: Secondary | ICD-10-CM

## 2014-02-05 MED ORDER — SIMVASTATIN 40 MG PO TABS: ORAL_TABLET | ORAL | Status: DC

## 2014-02-05 MED ORDER — DENOSUMAB 60 MG/ML ~~LOC~~ SOLN
60.0000 mg | Freq: Once | SUBCUTANEOUS | Status: AC
  Administered 2014-02-05: 60 mg via SUBCUTANEOUS

## 2014-02-05 MED ORDER — LEVOTHYROXINE SODIUM 25 MCG PO TABS: ORAL_TABLET | ORAL | Status: DC

## 2014-02-05 MED ORDER — ESCITALOPRAM OXALATE 20 MG PO TABS: 20.0000 mg | ORAL_TABLET | Freq: Every day | ORAL | Status: DC

## 2014-02-05 MED ORDER — LISINOPRIL-HYDROCHLOROTHIAZIDE 10-12.5 MG PO TABS: 1.0000 | ORAL_TABLET | Freq: Every day | ORAL | Status: DC

## 2014-02-05 NOTE — Progress Notes (Signed)
Subjective:    Patient ID: Kristen Munoz, female    DOB: 30-Mar-1946, 68 y.o.   MRN: [id number]  Patient here today for follow up of chronic medical problems.  Hyperlipidemia This is a chronic problem. The current episode started more than 1 year ago. The problem is controlled. Recent lipid tests were reviewed and are normal. There are no known factors aggravating her hyperlipidemia. Pertinent negatives include no chest pain, focal weakness, leg pain, myalgias or shortness of breath. Current antihyperlipidemic treatment includes statins. The current treatment provides moderate improvement of lipids. Risk factors for coronary artery disease include hypertension and post-menopausal.  Hypertension This is a chronic problem. The current episode started more than 1 year ago. The problem has been resolved since onset. The problem is controlled. Pertinent negatives include no chest pain, malaise/fatigue, neck pain, orthopnea, palpitations, peripheral edema, shortness of breath or sweats. There are no associated agents to hypertension. Risk factors for coronary artery disease include post-menopausal state. Past treatments include ACE inhibitors and diuretics. The current treatment provides significant improvement. There are no compliance problems.  Hypertensive end-organ damage includes a thyroid problem.  Thyroid Problem Presents for follow-up (hypothyroidism) visit. Patient reports no anxiety, depressed mood, diaphoresis, diarrhea, dry skin, menstrual problem, palpitations or weight gain. The symptoms have been stable. Her past medical history is significant for hyperlipidemia.  Depression Zoloft not working well- without side effects- Her husband had a stroke and she is having to take care of him and she seems more stressed then before GERD Zegrid- works well - keeps symptoms under control Osteoporosis Suppose to get a prolia injection today   Review of Systems  Constitutional: Negative for weight gain,  malaise/fatigue and diaphoresis.  Respiratory: Negative for shortness of breath.   Cardiovascular: Negative for chest pain, palpitations and orthopnea.  Gastrointestinal: Negative for diarrhea.  Genitourinary: Negative for menstrual problem.  Musculoskeletal: Negative for myalgias and neck pain.  Neurological: Negative for focal weakness.  All other systems reviewed and are negative.      Objective:   Physical Exam  Constitutional: She is oriented to person, place, and time. She appears well-developed and well-nourished.  HENT:  Nose: Nose normal.  Mouth/Throat: Oropharynx is clear and moist.  Eyes: EOM are normal.  Neck: Trachea normal, normal range of motion and full passive range of motion without pain. Neck supple. No JVD present. Carotid bruit is not present. No thyromegaly present.  Cardiovascular: Normal rate, regular rhythm, normal heart sounds and intact distal pulses.  Exam reveals no gallop and no friction rub.   No murmur heard. Pulmonary/Chest: Effort normal and breath sounds normal.  Abdominal: Soft. Bowel sounds are normal. She exhibits no distension and no mass. There is no tenderness.  Musculoskeletal: Normal range of motion.  Lymphadenopathy:    She has no cervical adenopathy.  Neurological: She is alert and oriented to person, place, and time. She has normal reflexes.  Skin: Skin is warm and dry.  Psychiatric: She has a normal mood and affect. Her behavior is normal. Judgment and thought content normal.    BP 148/80  Pulse 88  Temp(Src) 97 F (36.1 C) (Oral)  Ht _0  (1.6 m)  Wt 155 lb 6.4 oz (70.489 kg)  BMI 27.53 kg/m2       Assessment & Plan:   1. Osteoporosis   2. Stress incontinence, female   3. Hypothyroidism, unspecified hypothyroidism type   4. Hyperlipidemia   5. Gastroesophageal reflux disease, esophagitis presence not specified   6. Essential  hypertension, benign   7. Depression   8. Coronary artery disease involving native coronary  artery of native heart without angina pectoris    Orders Placed This Encounter  Procedures  . CMP14+EGFR  . NMR, lipoprofile  . Thyroid Panel With TSH   Meds ordered this encounter  Medications  . denosumab (PROLIA) injection 60 mg    Sig:   . simvastatin (ZOCOR) 40 MG tablet    Sig: TAKE ONE TABLET BY MOUTH ONE TIME DAILY    Dispense:  90 tablet    Refill:  1    Order Specific Question:  Supervising Provider    Answer:  Chipper Herb [[id number]]  . lisinopril-hydrochlorothiazide (PRINZIDE,ZESTORETIC) 10-12.5 MG per tablet    Sig: Take 1 tablet by mouth daily.    Dispense:  90 tablet    Refill:  1    Order Specific Question:  Supervising Provider    Answer:  Chipper Herb [[id number]]  . levothyroxine (SYNTHROID, LEVOTHROID) 25 MCG tablet    Sig: TAKE 1 TABLET (25 MCG TOTAL)  BY MOUTH DAILY BEFORE BREAKFAST.    Dispense:  90 tablet    Refill:  1    Order Specific Question:  Supervising Provider    Answer:  Chipper Herb [[id number]]  . escitalopram (LEXAPRO) 20 MG tablet    Sig: Take 1 tablet (20 mg total) by mouth daily.    Dispense:  90 tablet    Refill:  1    Order Specific Question:  Supervising Provider    Answer:  Joycelyn Man   Stopped zoloft and changed to [place] pending Health maintenance reviewed Diet and exercise encouraged Continue all meds Follow up  In 3 months   [place], FNP

## 2014-02-05 NOTE — Patient Instructions (Signed)

## 2014-02-06 LAB — CMP14+EGFR
A/G RATIO: 1.7 (ref 1.1–2.5)
ALBUMIN: 4.6 g/dL (ref 3.6–4.8)
ALT: 8 IU/L (ref 0–32)
AST: 10 IU/L (ref 0–40)
Alkaline Phosphatase: 77 IU/L (ref 39–117)
BILIRUBIN TOTAL: 0.2 mg/dL (ref 0.0–1.2)
BUN/Creatinine Ratio: 20 (ref 11–26)
BUN: 19 mg/dL (ref 8–27)
CO2: 22 mmol/L (ref 18–29)
Calcium: 9.7 mg/dL (ref 8.7–10.3)
Chloride: 100 mmol/L (ref 97–108)
Creatinine, Ser: 0.95 mg/dL (ref 0.57–1.00)
GFR, EST AFRICAN AMERICAN: 71 mL/min/{1.73_m2} (ref >59)
GFR, EST NON AFRICAN AMERICAN: 62 mL/min/{1.73_m2} (ref >59)
Globulin, Total: 2.7 g/dL (ref 1.5–4.5)
Glucose: 87 mg/dL (ref 65–99)
Potassium: 4.3 mmol/L (ref 3.5–5.2)
Sodium: 135 mmol/L (ref 134–144)
TOTAL PROTEIN: 7.3 g/dL (ref 6.0–8.5)

## 2014-02-06 LAB — NMR, LIPOPROFILE
CHOLESTEROL: 243 mg/dL — AB (ref 100–199)
HDL Cholesterol by NMR: 54 mg/dL (ref >39)
HDL PARTICLE NUMBER: 30.2 umol/L — AB (ref >=30.5)
LDL Particle Number: 1858 nmol/L — ABNORMAL HIGH (ref <1000)
LDL SIZE: 21.1 nm (ref >20.5)
LDLC SERPL CALC-MCNC: 155 mg/dL — ABNORMAL HIGH (ref 0–99)
LP-IR SCORE: 51 — AB (ref <=45)
SMALL LDL PARTICLE NUMBER: 887 nmol/L — AB (ref <=527)
TRIGLYCERIDES BY NMR: 171 mg/dL — AB (ref 0–149)

## 2014-02-06 LAB — THYROID PANEL WITH TSH
Free Thyroxine Index: 2.1 (ref 1.2–4.9)
T3 Uptake Ratio: 25 % (ref 24–39)
T4, Total: 8.4 ug/dL (ref 4.5–12.0)
TSH: 0.768 u[IU]/mL (ref 0.450–4.500)

## 2014-06-05 ENCOUNTER — Ambulatory Visit: Payer: Medicare HMO

## 2014-06-05 ENCOUNTER — Ambulatory Visit (INDEPENDENT_AMBULATORY_CARE_PROVIDER_SITE_OTHER): Payer: Medicare HMO | Admitting: *Deleted

## 2014-06-05 DIAGNOSIS — Z23 Encounter for immunization: Secondary | ICD-10-CM

## 2014-06-08 ENCOUNTER — Telehealth: Payer: Self-pay | Admitting: Family Medicine

## 2014-06-11 NOTE — Telephone Encounter (Signed)
Appointment 12/21 with Paulene FloorMary Martin, FNP.

## 2014-06-21 ENCOUNTER — Encounter: Payer: Self-pay | Admitting: Nurse Practitioner

## 2014-06-21 ENCOUNTER — Ambulatory Visit (INDEPENDENT_AMBULATORY_CARE_PROVIDER_SITE_OTHER): Payer: Medicare HMO | Admitting: Nurse Practitioner

## 2014-06-21 VITALS — BP 118/64 | HR 77 | Temp 97.7°F | Ht 63.0 in | Wt 150.0 lb

## 2014-06-21 DIAGNOSIS — K219 Gastro-esophageal reflux disease without esophagitis: Secondary | ICD-10-CM

## 2014-06-21 DIAGNOSIS — M25572 Pain in left ankle and joints of left foot: Secondary | ICD-10-CM

## 2014-06-21 MED ORDER — PANTOPRAZOLE SODIUM 40 MG PO TBEC
40.0000 mg | DELAYED_RELEASE_TABLET | Freq: Every day | ORAL | Status: DC
Start: 2014-06-21 — End: 2014-09-17

## 2014-06-21 MED ORDER — DICLOFENAC SODIUM 75 MG PO TBEC: 75.0000 mg | DELAYED_RELEASE_TABLET | Freq: Two times a day (BID) | ORAL | Status: DC

## 2014-06-21 NOTE — Progress Notes (Signed)
   Subjective:    Patient ID: Kristen Munoz, female    DOB: [birth year], 68 y.o.   MRN: [id number]  HPI Patient in with 2 complaints: - GERD- Patient says that her reflux has been acting up- keeping her up at night. OTC meds not working - Left ankle pain- pain starts in ankle and goes up leg- denies any injury- Has been going on for several months  But os getting worse.    Review of Systems  Constitutional: Negative.   HENT: Negative.   Respiratory: Negative.   Cardiovascular: Negative.   Gastrointestinal: Negative for nausea, vomiting and abdominal pain.  Genitourinary: Negative.   Musculoskeletal: Positive for arthralgias.  Neurological: Negative.   Psychiatric/Behavioral: Negative.   All other systems reviewed and are negative.      Objective:   Physical Exam  Constitutional: She is oriented to person, place, and time. She appears well-developed and well-nourished.  Cardiovascular: Normal rate and normal heart sounds.   Pulmonary/Chest: Effort normal and breath sounds normal.  Abdominal: Soft. Bowel sounds are normal. She exhibits no distension and no mass. There is no tenderness. There is no rebound and no guarding.  Musculoskeletal:  FROM of left ankle with pain on flexion No edema No point tenderness  Neurological: She is alert and oriented to person, place, and time.  Skin: Skin is warm and dry.  Psychiatric: She has a normal mood and affect. Her behavior is normal. Judgment and thought content normal.    BP 118/64 mmHg  Pulse 77  Temp(Src) 97.7 F (36.5 C) (Oral)  Ht $R'5\' 3"'kM$  (1.6 m)  Wt 150 lb (68.04 kg)  BMI 26.58 kg/m2       Assessment & Plan:  1. Pain in joint, ankle and foot, left Moist heat of helps - Arthritis Panel - CMP14+EGFR - diclofenac (VOLTAREN) 75 MG EC tablet; Take 1 tablet (75 mg total) by mouth 2 (two) times daily.  Dispense: 30 tablet; Refill: 0  2. Gastroesophageal reflux disease without esophagitis Avoid spicy and fatty foods -  pantoprazole (PROTONIX) 40 MG tablet; Take 1 tablet (40 mg total) by mouth daily.  Dispense: 30 tablet; Refill: 3  RTO prn  Mary-Margaret Hassell Done, FNP

## 2014-06-21 NOTE — Patient Instructions (Signed)
Gastroesophageal Reflux Disease, Adult Gastroesophageal reflux disease (GERD) happens when acid from your stomach flows up into the esophagus. When acid comes in contact with the esophagus, the acid causes soreness (inflammation) in the esophagus. Over time, GERD may create small holes (ulcers) in the lining of the esophagus. CAUSES   Increased body weight. This puts pressure on the stomach, making acid rise from the stomach into the esophagus.  Smoking. This increases acid production in the stomach.  Drinking alcohol. This causes decreased pressure in the lower esophageal sphincter (valve or ring of muscle between the esophagus and stomach), allowing acid from the stomach into the esophagus.  Late evening meals and a full stomach. This increases pressure and acid production in the stomach.  A malformed lower esophageal sphincter. Sometimes, no cause is found. SYMPTOMS   Burning pain in the lower part of the mid-chest behind the breastbone and in the mid-stomach area. This may occur twice a week or more often.  Trouble swallowing.  Sore throat.  Dry cough.  Asthma-like symptoms including chest tightness, shortness of breath, or wheezing. DIAGNOSIS  Your caregiver may be able to diagnose GERD based on your symptoms. In some cases, X-rays and other tests may be done to check for complications or to check the condition of your stomach and esophagus. TREATMENT  Your caregiver may recommend over-the-counter or prescription medicines to help decrease acid production. Ask your caregiver before starting or adding any new medicines.  HOME CARE INSTRUCTIONS   Change the factors that you can control. Ask your caregiver for guidance concerning weight loss, quitting smoking, and alcohol consumption.  Avoid foods and drinks that make your symptoms worse, such as:  Caffeine or alcoholic drinks.  Chocolate.  Peppermint or mint flavorings.  Garlic and onions.  Spicy foods.  Citrus fruits,  such as oranges, lemons, or limes.  Tomato-based foods such as sauce, chili, salsa, and pizza.  Fried and fatty foods.  Avoid lying down for the 3 hours prior to your bedtime or prior to taking a nap.  Eat small, frequent meals instead of large meals.  Wear loose-fitting clothing. Do not wear anything tight around your waist that causes pressure on your stomach.  Raise the head of your bed 6 to 8 inches with wood blocks to help you sleep. Extra pillows will not help.  Only take over-the-counter or prescription medicines for pain, discomfort, or fever as directed by your caregiver.  Do not take aspirin, ibuprofen, or other nonsteroidal anti-inflammatory drugs (NSAIDs). SEEK IMMEDIATE MEDICAL CARE IF:   You have pain in your arms, neck, jaw, teeth, or back.  Your pain increases or changes in intensity or duration.  You develop nausea, vomiting, or sweating (diaphoresis).  You develop shortness of breath, or you faint.  Your vomit is green, yellow, black, or looks like coffee grounds or blood.  Your stool is red, bloody, or black. These symptoms could be signs of other problems, such as heart disease, gastric bleeding, or esophageal bleeding. MAKE SURE YOU:   Understand these instructions.  Will watch your condition.  Will get help right away if you are not doing well or get worse. Document Released: 03/28/2005 Document Revised: 09/10/2011 Document Reviewed: 01/05/2011 Exit[employer] Patient Information 2015 [employer]. This information is not intended to replace advice given to you by your health care provider. Make sure you discuss any questions you have with your health care provider.  

## 2014-06-23 LAB — ARTHRITIS PANEL
BASOS ABS: 0 10*3/uL (ref 0.0–0.2)
Basos: 0 %
EOS: 1 %
Eosinophils Absolute: 0.1 10*3/uL (ref 0.0–0.4)
HEMATOCRIT: 33.3 % — AB (ref 34.0–46.6)
Hemoglobin: 11.2 g/dL (ref 11.1–15.9)
IMMATURE GRANULOCYTES: 0 %
Immature Grans (Abs): 0 10*3/uL (ref 0.0–0.1)
Lymphocytes Absolute: 2.4 10*3/uL (ref 0.7–3.1)
Lymphs: 28 %
MCH: 31.4 pg (ref 26.6–33.0)
MCHC: 33.6 g/dL (ref 31.5–35.7)
MCV: 93 fL (ref 79–97)
MONOCYTES: 9 %
Monocytes Absolute: 0.7 10*3/uL (ref 0.1–0.9)
NEUTROS ABS: 5.1 10*3/uL (ref 1.4–7.0)
Neutrophils Relative %: 62 %
Platelets: 221 10*3/uL (ref 150–379)
RBC: 3.57 x10E6/uL — AB (ref 3.77–5.28)
RDW: 13.8 % (ref 12.3–15.4)
RHEUMATOID FACTOR: 11.4 [IU]/mL (ref 0.0–13.9)
Uric Acid: 6.7 mg/dL (ref 2.5–7.1)
WBC: 8.3 10*3/uL (ref 3.4–10.8)

## 2014-06-23 LAB — CMP14+EGFR
ALT: 6 IU/L (ref 0–32)
AST: 5 IU/L (ref 0–40)
Albumin/Globulin Ratio: 1.8 (ref 1.1–2.5)
Albumin: 4.2 g/dL (ref 3.6–4.8)
Alkaline Phosphatase: 70 IU/L (ref 39–117)
BUN/Creatinine Ratio: 15 (ref 11–26)
BUN: 16 mg/dL (ref 8–27)
CHLORIDE: 101 mmol/L (ref 97–108)
CO2: 22 mmol/L (ref 18–29)
Calcium: 8.8 mg/dL (ref 8.7–10.3)
Creatinine, Ser: 1.06 mg/dL — ABNORMAL HIGH (ref 0.57–1.00)
GFR calc Af Amer: 62 mL/min/{1.73_m2} (ref >59)
GFR calc non Af Amer: 54 mL/min/{1.73_m2} — ABNORMAL LOW (ref >59)
GLUCOSE: 106 mg/dL — AB (ref 65–99)
Globulin, Total: 2.4 g/dL (ref 1.5–4.5)
POTASSIUM: 4.5 mmol/L (ref 3.5–5.2)
Sodium: 138 mmol/L (ref 134–144)
Total Bilirubin: 0.3 mg/dL (ref 0.0–1.2)
Total Protein: 6.6 g/dL (ref 6.0–8.5)

## 2014-09-13 ENCOUNTER — Other Ambulatory Visit: Payer: Self-pay | Admitting: Nurse Practitioner

## 2014-09-14 NOTE — Telephone Encounter (Signed)
Patient NTBS for follow up and lab work before  next refill

## 2014-09-14 NOTE — Telephone Encounter (Signed)
Last seen 06/21/14 MMM  Last lipid 02/05/14  Requesting 90 day supply

## 2014-09-14 NOTE — Telephone Encounter (Signed)
Pt called & appt made for 4/11 w/ MMM

## 2014-09-17 ENCOUNTER — Ambulatory Visit (INDEPENDENT_AMBULATORY_CARE_PROVIDER_SITE_OTHER): Payer: Medicare HMO | Admitting: Pharmacist

## 2014-09-17 ENCOUNTER — Encounter: Payer: Self-pay | Admitting: Pharmacist

## 2014-09-17 VITALS — BP 118/64 | HR 74 | Ht 63.5 in | Wt 152.0 lb

## 2014-09-17 DIAGNOSIS — Z0000 Encounter for general adult medical examination without abnormal findings: Secondary | ICD-10-CM | POA: Diagnosis not present

## 2014-09-17 DIAGNOSIS — Z1239 Encounter for other screening for malignant neoplasm of breast: Secondary | ICD-10-CM

## 2014-09-17 DIAGNOSIS — K219 Gastro-esophageal reflux disease without esophagitis: Secondary | ICD-10-CM

## 2014-09-17 DIAGNOSIS — Z23 Encounter for immunization: Secondary | ICD-10-CM | POA: Diagnosis not present

## 2014-09-17 DIAGNOSIS — M810 Age-related osteoporosis without current pathological fracture: Secondary | ICD-10-CM

## 2014-09-17 DIAGNOSIS — Z716 Tobacco abuse counseling: Secondary | ICD-10-CM

## 2014-09-17 DIAGNOSIS — G4700 Insomnia, unspecified: Secondary | ICD-10-CM | POA: Insufficient documentation

## 2014-09-17 LAB — HM MAMMOGRAPHY

## 2014-09-17 MED ORDER — DENOSUMAB 60 MG/ML ~~LOC~~ SOLN
60.0000 mg | Freq: Once | SUBCUTANEOUS | Status: AC
  Administered 2014-09-17: 60 mg via SUBCUTANEOUS

## 2014-09-17 MED ORDER — SUVOREXANT 10 MG PO TABS: 1.0000 | ORAL_TABLET | Freq: Every evening | ORAL | Status: DC | PRN

## 2014-09-17 MED ORDER — ESCITALOPRAM OXALATE 20 MG PO TABS: 20.0000 mg | ORAL_TABLET | Freq: Every day | ORAL | Status: DC

## 2014-09-17 MED ORDER — SIMVASTATIN 40 MG PO TABS: 40.0000 mg | ORAL_TABLET | Freq: Every day | ORAL | Status: DC

## 2014-09-17 MED ORDER — PANTOPRAZOLE SODIUM 40 MG PO TBEC: 40.0000 mg | DELAYED_RELEASE_TABLET | Freq: Every day | ORAL | Status: DC

## 2014-09-17 NOTE — Progress Notes (Signed)
Patient ID: Kristen Munoz, female   DOB: 1945/12/06, 69 y.o.   MRN: [id number]    Subjective:   Kristen Munoz is a 69 y.o. female who presents for an Initial Medicare Annual Wellness Visit, to follow up osteoporosis and to have Prolia administered today.   Current Medications (verified) Outpatient Encounter Prescriptions as of 09/17/2014  Medication Sig  . aspirin EC 81 MG tablet Take 81 mg by mouth daily.  Marland Kitchen escitalopram (LEXAPRO) 20 MG tablet TAKE ONE TABLET BY MOUTH ONCE DAILY  . levothyroxine (SYNTHROID, LEVOTHROID) 25 MCG tablet TAKE 1 TABLET (25 MCG TOTAL)  BY MOUTH DAILY BEFORE BREAKFAST.  Marland Kitchen lisinopril-hydrochlorothiazide (PRINZIDE,ZESTORETIC) 10-12.5 MG per tablet TAKE ONE TABLET BY MOUTH ONCE DAILY  . pantoprazole (PROTONIX) 40 MG tablet Take 1 tablet (40 mg total) by mouth daily.  . simvastatin (ZOCOR) 40 MG tablet TAKE ONE TABLET BY MOUTH ONCE DAILY  . [DISCONTINUED] diclofenac (VOLTAREN) 75 MG EC tablet Take 1 tablet (75 mg total) by mouth 2 (two) times daily. (Patient not taking: Reported on 09/17/2014)  . [EXPIRED] denosumab (PROLIA) injection 60 mg     Allergies (verified) Sulfa antibiotics   History: Past Medical History  Diagnosis Date  . Hyperlipidemia   . Hypertension   . Thyroid disease   . GERD (gastroesophageal reflux disease)   . Osteoporosis   . CAD (coronary artery disease)   . Urinary bladder incontinence    Past Surgical History  Procedure Laterality Date  . Abdominal hysterectomy    . Thyroid surgery    . Vesicovaginal fistula closure w/ tah     Family History  Problem Relation Age of Onset  . Cancer Mother     colon  . Hypertension Mother   . Diabetes Mother   . Cancer Father     lung  . Diabetes Father   . Hypertension Father   . Cancer Sister     bone  . Heart failure Sister   . Cancer Brother     kidney  . Drug abuse Paternal Uncle   . Early death Brother     gun shot   Social History   Occupational History  . Not on file.    Social History Main Topics  . Smoking status: Current Every Day Smoker -- 1.00 packs/day for 38 years    Types: Cigarettes    Start date: 07/02/1974  . Smokeless tobacco: Never Used  . Alcohol Use: No  . Drug Use: No  . Sexual Activity: No    Do you feel safe at home?  Yes  Dietary issues and exercise activities: Current Exercise Habits:: The patient does not participate in regular exercise at present  Current Dietary habits:  Patient eats balanced diet with low fat intake  Objective:    Today's Vitals   09/17/14 1647  BP: 118/64  Pulse: 74  Height: 5' 3.5" (1.613 m)  Weight: 152 lb (68.947 kg)  PainSc: 2   PainLoc: Leg   Body mass index is 26.5 kg/(m^2).  Activities of Daily Living In your present state of health, do you have any difficulty performing the following activities: 09/17/2014  Is the patient deaf or have difficulty hearing? N  Hearing N  Vision N  Difficulty concentrating or making decisions N  Walking or climbing stairs? N  Doing errands, shopping? N  Preparing Food and eating ? N  Using the Toilet? N  In the past six months, have you accidently leaked urine? Y  Do you have  problems with loss of bowel control? N  Managing your Medications? N  Managing your Finances? N  Housekeeping or managing your Housekeeping? N    Are there smokers in your home (other than you)? No   Cardiac Risk Factors include: advanced age (>75men, >58 women);dyslipidemia;sedentary lifestyle  Depression Screen PHQ 2/9 Scores 09/17/2014 06/21/2014 02/05/2014 02/05/2014  PHQ - 2 Score 2 0 2 4  PHQ- 9 Score 4 - 6 17    Fall Risk Fall Risk  09/17/2014 06/21/2014 02/05/2014 02/05/2014  Falls in the past year? Yes No No No  Number falls in past yr: 1 - - -  Injury with Fall? No - - -    Cognitive Function: MMSE - Mini Mental State Exam 09/17/2014  Orientation to time 5  Orientation to Place 5  Registration 3  Attention/ Calculation 5  Recall 3  Language- name 2 objects 2   Language- repeat 1  Language- follow 3 step command 3  Language- read & follow direction 1  Write a sentence 1  Copy design 1  Total score 30    Immunizations and Health Maintenance Immunization History  Administered Date(s) Administered  . Influenza,inj,Quad PF,36+ Mos 04/22/2013, 06/05/2014   Health Maintenance Due  Topic Date Due  . ZOSTAVAX  12/28/2005  . PNA vac Low Risk Adult (1 of 2 - PCV13) 12/29/2010    Patient Care Team: [name], FNP as PCP - General (Nurse Practitioner)  Indicate any recent Medical Services you may have received from other than [facility] providers in the past year (date may be approximate).    Assessment:    Annual Wellness Visit  Osteoporosis Insomnia / difficulty sleeping  Screening Tests Health Maintenance  Topic Date Due  . ZOSTAVAX  12/28/2005  . PNA vac Low Risk Adult (1 of 2 - PCV13) 12/29/2010  . DEXA SCAN  10/30/2014  . MAMMOGRAM  12/23/2014  . INFLUENZA VACCINE  01/31/2015  . TETANUS/TDAP  04/11/2019  . COLONOSCOPY  09/04/2022        Plan:   During the course of the visit Shaquan was educated and counseled about the following appropriate screening and preventive services:   Vaccines to include Pneumoccal, Influenza, Hepatitis B, Td, Zostavax - patient is due Zostavax and Prevnar 13.  Patient received Zostavax but was unable to give Prevnar 13 because office is out of stock and could only administer Zostavax OR Prenvar 13 today.  Prolia administered in office today -  SQ.  Colorectal cancer screening - Colonoscopy UTD but needs FOBT checked  Cardiovascular disease screening  - due to have labs 10/11/14 with PCP  Diabetes screening - Due to have labs with PCP 10/11/14  Bone Denisty / Osteoporosis Screening - UTD  Mammogram - Due.  Patient has had past mammogram which required referral to [facility].  She requests to go to [facility] in [place] since it is difficult to leave her husband at home  alone.  Glaucoma screening / Eye Exam - UTD, will request notes from Dr Conley Rolls  Smoking cessation counseling - Discussed benefits of smoking cessation with patient.  She is not currently ready to quit and declines nicotine patches or other pharmacotherapy assistance.   Advanced Directives - patient is currently working with lawyer on AD.  Start belsomnra  1 tablet at bedtime - rx for #30 no refills. (signed by PCP [name], NP)  Aldo sent in Rx's for #30 tablets  For simvastatin, escitalopram and pantoprazole to last patient  until appt with PCP 10/11/2014  Encouraged increased physical activity.  Discussed a few exercises she could do inside at home since she is unable to leave husband for long periods of time.    Patient Instructions (the written plan) were given to the patient.   Henrene Pastorckard, Shiro Ellerman, [facility] Hospital[facility]ARMD   09/17/2014

## 2014-09-17 NOTE — Patient Instructions (Signed)
Fall Prevention and Home Safety Falls cause injuries and can affect all age groups. It is possible to use preventive measures to significantly decrease the likelihood of falls. There are many simple measures which can make your home safer and prevent falls. OUTDOORS  Repair cracks and edges of walkways and driveways.  Remove high doorway thresholds.  Trim shrubbery on the main path into your home.  Have good outside lighting.  Clear walkways of tools, rocks, debris, and clutter.  Check that handrails are not broken and are securely fastened. Both sides of steps should have handrails.  Have leaves, snow, and ice cleared regularly.  Use sand or salt on walkways during winter months.  In the garage, clean up grease or oil spills. BATHROOM  Install night lights.  Install grab bars by the toilet and in the tub and shower.  Use non-skid mats or decals in the tub or shower.  Place a plastic non-slip stool in the shower to sit on, if needed.  Keep floors dry and clean up all water on the floor immediately.  Remove soap buildup in the tub or shower on a regular basis.  Secure bath mats with non-slip, double-sided rug tape.  Remove throw rugs and tripping hazards from the floors. BEDROOMS  Install night lights.  Make sure a bedside light is easy to reach.  Do not use oversized bedding.  Keep a telephone by your bedside.  Have a firm chair with side arms to use for getting dressed.  Remove throw rugs and tripping hazards from the floor. KITCHEN  Keep handles on pots and pans turned toward the center of the stove. Use back burners when possible.  Clean up spills quickly and allow time for drying.  Avoid walking on wet floors.  Avoid hot utensils and knives.  Position shelves so they are not too high or low.  Place commonly used objects within easy reach.  If necessary, use a sturdy step stool with a grab bar when reaching.  Keep electrical cables out of the  way.  Do not use floor polish or wax that makes floors slippery. If you must use wax, use non-skid floor wax.  Remove throw rugs and tripping hazards from the floor. STAIRWAYS  Never leave objects on stairs.  Place handrails on both sides of stairways and use them. Fix any loose handrails. Make sure handrails on both sides of the stairways are as long as the stairs.  Check carpeting to make sure it is firmly attached along stairs. Make repairs to worn or loose carpet promptly.  Avoid placing throw rugs at the top or bottom of stairways, or properly secure the rug with carpet tape to prevent slippage. Get rid of throw rugs, if possible.  Have an electrician put in a light switch at the top and bottom of the stairs. OTHER FALL PREVENTION TIPS  Wear low-heel or rubber-soled shoes that are supportive and fit well. Wear closed toe shoes.  When using a stepladder, make sure it is fully opened and both spreaders are firmly locked. Do not climb a closed stepladder.  Add color or contrast paint or tape to grab bars and handrails in your home. Place contrasting color strips on first and last steps.  Learn and use mobility aids as needed. Install an electrical emergency response system.  Turn on lights to avoid dark areas. Replace light bulbs that burn out immediately. Get light switches that glow.  Arrange furniture to create clear pathways. Keep furniture in the same place.    Firmly attach carpet with non-skid or double-sided tape.  Eliminate uneven floor surfaces.  Select a carpet pattern that does not visually hide the edge of steps.  Be aware of all pets. OTHER HOME SAFETY TIPS  Set the water temperature for 120 F (48.8 C).  Keep emergency numbers on or near the telephone.  Keep smoke detectors on every level of the home and near sleeping areas. Document Released: 06/08/2002 Document Revised: 12/18/2011 Document Reviewed: 09/07/2011 [facility] Patient Information 2015  [place], [place]. This information is not intended to replace advice given to you by your health care provider. Make sure you discuss any questions you have with your health care provider.   Preventive Care for Adults A healthy lifestyle and preventive care can promote health and wellness. Preventive health guidelines for women include the following key practices.  A routine yearly physical is a good way to check with your health care provider about your health and preventive screening. It is a chance to share any concerns and updates on your health and to receive a thorough exam.  Visit your dentist for a routine exam and preventive care every 6 months. Brush your teeth twice a day and floss once a day. Good oral hygiene prevents tooth decay and gum disease.  The frequency of eye exams is based on your age, health, family medical history, use of contact lenses, and other factors. Follow your health care provider's recommendations for frequency of eye exams.  Eat a healthy diet. Foods like vegetables, fruits, whole grains, low-fat dairy products, and lean protein foods contain the nutrients you need without too many calories. Decrease your intake of foods high in solid fats, added sugars, and salt. Eat the right amount of calories for you.Get information about a proper diet from your health care provider, if necessary.  Regular physical exercise is one of the most important things you can do for your health. Most adults should get at least 150 minutes of moderate-intensity exercise (any activity that increases your heart rate and causes you to sweat) each week. In addition, most adults need muscle-strengthening exercises on 2 or more days a week.  Maintain a healthy weight. The body mass index (BMI) is a screening tool to identify possible weight problems. It provides an estimate of body fat based on height and weight. Your health care provider can find your BMI and can help you achieve or maintain a  healthy weight.For adults 20 years and older:  A BMI below 18.5 is considered underweight.  A BMI of 18.5 to 24.9 is normal.  A BMI of 25 to 29.9 is considered overweight.  A BMI of 30 and above is considered obese.  Maintain normal blood lipids and cholesterol levels by exercising and minimizing your intake of saturated fat. Eat a balanced diet with plenty of fruit and vegetables. Blood tests for lipids and cholesterol should begin at age 64 and be repeated every 5 years. If your lipid or cholesterol levels are high, you are over 50, or you are at high risk for heart disease, you may need your cholesterol levels checked more frequently.Ongoing high lipid and cholesterol levels should be treated with medicines if diet and exercise are not working.  If you smoke, find out from your health care provider how to quit. If you do not use tobacco, do not start.  Lung cancer screening is recommended for adults aged 74-80 years who are at high risk for developing lung cancer because of a history of smoking. A yearly  low-dose CT scan of the lungs is recommended for people who have at least a 30-pack-year history of smoking and are a current smoker or have quit within the past 15 years. A pack year of smoking is smoking an average of 1 pack of cigarettes a day for 1 year (for example: 1 pack a day for 30 years or 2 packs a day for 15 years). Yearly screening should continue until the smoker has stopped smoking for at least 15 years. Yearly screening should be stopped for people who develop a health problem that would prevent them from having lung cancer treatment.  If you are pregnant, do not drink alcohol. If you are breastfeeding, be very cautious about drinking alcohol. If you are not pregnant and choose to drink alcohol, do not have more than 1 drink per day. One drink is considered to be 12 ounces (355 mL) of beer, 5 ounces (148 mL) of wine, or 1.5 ounces (44 mL) of liquor.  Avoid use of street drugs.  Do not share needles with anyone. Ask for help if you need support or instructions about stopping the use of drugs.  High blood pressure causes heart disease and increases the risk of stroke. Your blood pressure should be checked at least every 1 to 2 years. Ongoing high blood pressure should be treated with medicines if weight loss and exercise do not work.  If you are 46-91 years old, ask your health care provider if you should take aspirin to prevent strokes.  Diabetes screening involves taking a blood sample to check your fasting blood sugar level. This should be done once every 3 years, after age 26, if you are within normal weight and without risk factors for diabetes. Testing should be considered at a younger age or be carried out more frequently if you are overweight and have at least 1 risk factor for diabetes.  Breast cancer screening is essential preventive care for women. You should practice "breast self-awareness." This means understanding the normal appearance and feel of your breasts and may include breast self-examination. Any changes detected, no matter how small, should be reported to a health care provider. Women in their 66s and 30s should have a clinical breast exam (CBE) by a health care provider as part of a regular health exam every 1 to 3 years. After age 54, women should have a CBE every year. Starting at age 25, women should consider having a mammogram (breast X-ray test) every year. Women who have a family history of breast cancer should talk to their health care provider about genetic screening. Women at a high risk of breast cancer should talk to their health care providers about having an MRI and a mammogram every year.  Breast cancer gene (BRCA)-related cancer risk assessment is recommended for women who have family members with BRCA-related cancers. BRCA-related cancers include breast, ovarian, tubal, and peritoneal cancers. Having family members with these cancers may be  associated with an increased risk for harmful changes (mutations) in the breast cancer genes BRCA1 and BRCA2. Results of the assessment will determine the need for genetic counseling and BRCA1 and BRCA2 testing.  Routine pelvic exams to screen for cancer are no longer recommended for nonpregnant women who are considered low risk for cancer of the pelvic organs (ovaries, uterus, and vagina) and who do not have symptoms. Ask your health care provider if a screening pelvic exam is right for you.  If you have had past treatment for cervical cancer or a condition  that could lead to cancer, you need Pap tests and screening for cancer for at least 20 years after your treatment. If Pap tests have been discontinued, your risk factors (such as having a new sexual partner) need to be reassessed to determine if screening should be resumed. Some women have medical problems that increase the chance of getting cervical cancer. In these cases, your health care provider may recommend more frequent screening and Pap tests.  The HPV test is an additional test that may be used for cervical cancer screening. The HPV test looks for the virus that can cause the cell changes on the cervix. The cells collected during the Pap test can be tested for HPV. The HPV test could be used to screen women aged 49 years and older, and should be used in women of any age who have unclear Pap test results. After the age of 37, women should have HPV testing at the same frequency as a Pap test.  Colorectal cancer can be detected and often prevented. Most routine colorectal cancer screening begins at the age of 41 years and continues through age 79 years. However, your health care provider may recommend screening at an earlier age if you have risk factors for colon cancer. On a yearly basis, your health care provider may provide home test kits to check for hidden blood in the stool. Use of a small camera at the end of a tube, to directly examine the  colon (sigmoidoscopy or colonoscopy), can detect the earliest forms of colorectal cancer. Talk to your health care provider about this at age 42, when routine screening begins. Direct exam of the colon should be repeated every 5-10 years through age 92 years, unless early forms of pre-cancerous polyps or small growths are found.  People who are at an increased risk for hepatitis B should be screened for this virus. You are considered at high risk for hepatitis B if:  You were born in a country where hepatitis B occurs often. Talk with your health care provider about which countries are considered high risk.  Your parents were born in a high-risk country and you have not received a shot to protect against hepatitis B (hepatitis B vaccine).  You have HIV or AIDS.  You use needles to inject street drugs.  You live with, or have sex with, someone who has hepatitis B.  You get hemodialysis treatment.  You take certain medicines for conditions like cancer, organ transplantation, and autoimmune conditions.  Hepatitis C blood testing is recommended for all people born from [birth year] through 1965 and any individual with known risks for hepatitis C.  Practice safe sex. Use condoms and avoid high-risk sexual practices to reduce the spread of sexually transmitted infections (STIs). STIs include gonorrhea, chlamydia, syphilis, trichomonas, herpes, HPV, and human immunodeficiency virus (HIV). Herpes, HIV, and HPV are viral illnesses that have no cure. They can result in disability, cancer, and death.  You should be screened for sexually transmitted illnesses (STIs) including gonorrhea and chlamydia if:  You are sexually active and are younger than 24 years.  You are older than 24 years and your health care provider tells you that you are at risk for this type of infection.  Your sexual activity has changed since you were last screened and you are at an increased risk for chlamydia or gonorrhea. Ask your  health care provider if you are at risk.  If you are at risk of being infected with HIV, it is recommended that  you take a prescription medicine daily to prevent HIV infection. This is called preexposure prophylaxis (PrEP). You are considered at risk if:  You are a heterosexual woman, are sexually active, and are at increased risk for HIV infection.  You take drugs by injection.  You are sexually active with a partner who has HIV.  Talk with your health care provider about whether you are at high risk of being infected with HIV. If you choose to begin PrEP, you should first be tested for HIV. You should then be tested every 3 months for as long as you are taking PrEP.  Osteoporosis is a disease in which the bones lose minerals and strength with aging. This can result in serious bone fractures or breaks. The risk of osteoporosis can be identified using a bone density scan. Women ages 44 years and over and women at risk for fractures or osteoporosis should discuss screening with their health care providers. Ask your health care provider whether you should take a calcium supplement or vitamin D to reduce the rate of osteoporosis.  Menopause can be associated with physical symptoms and risks. Hormone replacement therapy is available to decrease symptoms and risks. You should talk to your health care provider about whether hormone replacement therapy is right for you.  Use sunscreen. Apply sunscreen liberally and repeatedly throughout the day. You should seek shade when your shadow is shorter than you. Protect yourself by wearing long sleeves, pants, a wide-brimmed hat, and sunglasses year round, whenever you are outdoors.  Once a month, do a whole body skin exam, using a mirror to look at the skin on your back. Tell your health care provider of new moles, moles that have irregular borders, moles that are larger than a pencil eraser, or moles that have changed in shape or color.  Stay current with  required vaccines (immunizations).  Influenza vaccine. All adults should be immunized every year.  Tetanus, diphtheria, and acellular pertussis (Td, Tdap) vaccine. Pregnant women should receive 1 dose of Tdap vaccine during each pregnancy. The dose should be obtained regardless of the length of time since the last dose. Immunization is preferred during the 27th-36th week of gestation. An adult who has not previously received Tdap or who does not know her vaccine status should receive 1 dose of Tdap. This initial dose should be followed by tetanus and diphtheria toxoids (Td) booster doses every 10 years. Adults with an unknown or incomplete history of completing a 3-dose immunization series with Td-containing vaccines should begin or complete a primary immunization series including a Tdap dose. Adults should receive a Td booster every 10 years.  Varicella vaccine. An adult without evidence of immunity to varicella should receive 2 doses or a second dose if she has previously received 1 dose. Pregnant females who do not have evidence of immunity should receive the first dose after pregnancy. This first dose should be obtained before leaving the health care facility. The second dose should be obtained 4-8 weeks after the first dose.  Human papillomavirus (HPV) vaccine. Females aged 13-26 years who have not received the vaccine previously should obtain the 3-dose series. The vaccine is not recommended for use in pregnant females. However, pregnancy testing is not needed before receiving a dose. If a female is found to be pregnant after receiving a dose, no treatment is needed. In that case, the remaining doses should be delayed until after the pregnancy. Immunization is recommended for any person with an immunocompromised condition through the age of  26 years if she did not get any or all doses earlier. During the 3-dose series, the second dose should be obtained 4-8 weeks after the first dose. The third dose  should be obtained 24 weeks after the first dose and 16 weeks after the second dose.  Zoster vaccine. One dose is recommended for adults aged 70 years or older unless certain conditions are present.  Measles, mumps, and rubella (MMR) vaccine. Adults born before 75 generally are considered immune to measles and mumps. Adults born in [birth year] or later should have 1 or more doses of MMR vaccine unless there is a contraindication to the vaccine or there is laboratory evidence of immunity to each of the three diseases. A routine second dose of MMR vaccine should be obtained at least 28 days after the first dose for students attending postsecondary schools, health care workers, or international travelers. People who received inactivated measles vaccine or an unknown type of measles vaccine during 1963-1967 should receive 2 doses of MMR vaccine. People who received inactivated mumps vaccine or an unknown type of mumps vaccine before 1979 and are at high risk for mumps infection should consider immunization with 2 doses of MMR vaccine. For females of childbearing age, rubella immunity should be determined. If there is no evidence of immunity, females who are not pregnant should be vaccinated. If there is no evidence of immunity, females who are pregnant should delay immunization until after pregnancy. Unvaccinated health care workers born before [birth year] who lack laboratory evidence of measles, mumps, or rubella immunity or laboratory confirmation of disease should consider measles and mumps immunization with 2 doses of MMR vaccine or rubella immunization with 1 dose of MMR vaccine.  Pneumococcal 13-valent conjugate (PCV13) vaccine. When indicated, a person who is uncertain of her immunization history and has no record of immunization should receive the PCV13 vaccine. An adult aged 79 years or older who has certain medical conditions and has not been previously immunized should receive 1 dose of PCV13 vaccine. This PCV13  should be followed with a dose of pneumococcal polysaccharide (PPSV23) vaccine. The PPSV23 vaccine dose should be obtained at least 8 weeks after the dose of PCV13 vaccine. An adult aged 20 years or older who has certain medical conditions and previously received 1 or more doses of PPSV23 vaccine should receive 1 dose of PCV13. The PCV13 vaccine dose should be obtained 1 or more years after the last PPSV23 vaccine dose.  Pneumococcal polysaccharide (PPSV23) vaccine. When PCV13 is also indicated, PCV13 should be obtained first. All adults aged 40 years and older should be immunized. An adult younger than age 62 years who has certain medical conditions should be immunized. Any person who resides in a nursing home or long-term care facility should be immunized. An adult smoker should be immunized. People with an immunocompromised condition and certain other conditions should receive both PCV13 and PPSV23 vaccines. People with human immunodeficiency virus (HIV) infection should be immunized as soon as possible after diagnosis. Immunization during chemotherapy or radiation therapy should be avoided. Routine use of PPSV23 vaccine is not recommended for American Indians, [place] Natives, or people younger than 65 years unless there are medical conditions that require PPSV23 vaccine. When indicated, people who have unknown immunization and have no record of immunization should receive PPSV23 vaccine. One-time revaccination 5 years after the first dose of PPSV23 is recommended for people aged 19-64 years who have chronic kidney failure, nephrotic syndrome, asplenia, or immunocompromised conditions. People who received 1-2 doses of  of PPSV23 before age 65 years should receive another dose of PPSV23 vaccine at age 65 years or later if at least 5 years have passed since the previous dose. Doses of PPSV23 are not needed for people immunized with PPSV23 at or after age 65 years.  Meningococcal vaccine. Adults with asplenia or  persistent complement component deficiencies should receive 2 doses of quadrivalent meningococcal conjugate (MenACWY-D) vaccine. The doses should be obtained at least 2 months apart. Microbiologists working with certain meningococcal bacteria, military recruits, people at risk during an outbreak, and people who travel to or live in countries with a high rate of meningitis should be immunized. A first-year college student up through age 21 years who is living in a residence hall should receive a dose if she did not receive a dose on or after her 16th birthday. Adults who have certain high-risk conditions should receive one or more doses of vaccine.  Hepatitis A vaccine. Adults who wish to be protected from this disease, have certain high-risk conditions, work with hepatitis A-infected animals, work in hepatitis A research labs, or travel to or work in countries with a high rate of hepatitis A should be immunized. Adults who were previously unvaccinated and who anticipate close contact with an international adoptee during the first 60 days after arrival in the [place] from a country with a high rate of hepatitis A should be immunized.  Hepatitis B vaccine. Adults who wish to be protected from this disease, have certain high-risk conditions, may be exposed to blood or other infectious body fluids, are household contacts or sex partners of hepatitis B positive people, are clients or workers in certain care facilities, or travel to or work in countries with a high rate of hepatitis B should be immunized.  Haemophilus influenzae type b (Hib) vaccine. A previously unvaccinated person with asplenia or sickle cell disease or having a scheduled splenectomy should receive 1 dose of Hib vaccine. Regardless of previous immunization, a recipient of a hematopoietic stem cell transplant should receive a 3-dose series 6-12 months after her successful transplant. Hib vaccine is not recommended for adults with HIV  infection. Preventive Services / Frequency Ages 19 to 39 years  Blood pressure check.** / Every 1 to 2 years.  Lipid and cholesterol check.** / Every 5 years beginning at age 20.  Clinical breast exam.** / Every 3 years for women in their 20s and 30s.  BRCA-related cancer risk assessment.** / For women who have family members with a BRCA-related cancer (breast, ovarian, tubal, or peritoneal cancers).  Pap test.** / Every 2 years from ages 21 through 29. Every 3 years starting at age 30 through age 65 or 70 with a history of 3 consecutive normal Pap tests.  HPV screening.** / Every 3 years from ages 30 through ages 65 to 70 with a history of 3 consecutive normal Pap tests.  Hepatitis C blood test.** / For any individual with known risks for hepatitis C.  Skin self-exam. / Monthly.  Influenza vaccine. / Every year.  Tetanus, diphtheria, and acellular pertussis (Tdap, Td) vaccine.** / Consult your health care provider. Pregnant women should receive 1 dose of Tdap vaccine during each pregnancy. 1 dose of Td every 10 years.  Varicella vaccine.** / Consult your health care provider. Pregnant females who do not have evidence of immunity should receive the first dose after pregnancy.  HPV vaccine. / 3 doses over 6 months, if 26 and younger. The vaccine is not recommended for use in pregnant   females. However, pregnancy testing is not needed before receiving a dose.  Measles, mumps, rubella (MMR) vaccine.** / You need at least 1 dose of MMR if you were born in 1957 or later. You may also need a 2nd dose. For females of childbearing age, rubella immunity should be determined. If there is no evidence of immunity, females who are not pregnant should be vaccinated. If there is no evidence of immunity, females who are pregnant should delay immunization until after pregnancy.  Pneumococcal 13-valent conjugate (PCV13) vaccine.** / Consult your health care provider.  Pneumococcal polysaccharide  (PPSV23) vaccine.** / 1 to 2 doses if you smoke cigarettes or if you have certain conditions.  Meningococcal vaccine.** / 1 dose if you are age 19 to 21 years and a first-year college student living in a residence hall, or have one of several medical conditions, you need to get vaccinated against meningococcal disease. You may also need additional booster doses.  Hepatitis A vaccine.** / Consult your health care provider.  Hepatitis B vaccine.** / Consult your health care provider.  Haemophilus influenzae type b (Hib) vaccine.** / Consult your health care provider. Ages 40 to 64 years  Blood pressure check.** / Every 1 to 2 years.  Lipid and cholesterol check.** / Every 5 years beginning at age 20 years.  Lung cancer screening. / Every year if you are aged 55-80 years and have a 30-pack-year history of smoking and currently smoke or have quit within the past 15 years. Yearly screening is stopped once you have quit smoking for at least 15 years or develop a health problem that would prevent you from having lung cancer treatment.  Clinical breast exam.** / Every year after age 40 years.  BRCA-related cancer risk assessment.** / For women who have family members with a BRCA-related cancer (breast, ovarian, tubal, or peritoneal cancers).  Mammogram.** / Every year beginning at age 40 years and continuing for as long as you are in good health. Consult with your health care provider.  Pap test.** / Every 3 years starting at age 30 years through age 65 or 70 years with a history of 3 consecutive normal Pap tests.  HPV screening.** / Every 3 years from ages 30 years through ages 65 to 70 years with a history of 3 consecutive normal Pap tests.  Fecal occult blood test (FOBT) of stool. / Every year beginning at age 50 years and continuing until age 75 years. You may not need to do this test if you get a colonoscopy every 10 years.  Flexible sigmoidoscopy or colonoscopy.** / Every 5 years for a  flexible sigmoidoscopy or every 10 years for a colonoscopy beginning at age 50 years and continuing until age 75 years.  Hepatitis C blood test.** / For all people born from 1945 through 1965 and any individual with known risks for hepatitis C.  Skin self-exam. / Monthly.  Influenza vaccine. / Every year.  Tetanus, diphtheria, and acellular pertussis (Tdap/Td) vaccine.** / Consult your health care provider. Pregnant women should receive 1 dose of Tdap vaccine during each pregnancy. 1 dose of Td every 10 years.  Varicella vaccine.** / Consult your health care provider. Pregnant females who do not have evidence of immunity should receive the first dose after pregnancy.  Zoster vaccine.** / 1 dose for adults aged 60 years or older.  Measles, mumps, rubella (MMR) vaccine.** / You need at least 1 dose of MMR if you were born in 1957 or later. You may also need a 2nd   dose. For females of childbearing age, rubella immunity should be determined. If there is no evidence of immunity, females who are not pregnant should be vaccinated. If there is no evidence of immunity, females who are pregnant should delay immunization until after pregnancy.  Pneumococcal 13-valent conjugate (PCV13) vaccine.** / Consult your health care provider.  Pneumococcal polysaccharide (PPSV23) vaccine.** / 1 to 2 doses if you smoke cigarettes or if you have certain conditions.  Meningococcal vaccine.** / Consult your health care provider.  Hepatitis A vaccine.** / Consult your health care provider.  Hepatitis B vaccine.** / Consult your health care provider.  Haemophilus influenzae type b (Hib) vaccine.** / Consult your health care provider. Ages 65 years and over  Blood pressure check.** / Every 1 to 2 years.  Lipid and cholesterol check.** / Every 5 years beginning at age 20 years.  Lung cancer screening. / Every year if you are aged 55-80 years and have a 30-pack-year history of smoking and currently smoke or have  quit within the past 15 years. Yearly screening is stopped once you have quit smoking for at least 15 years or develop a health problem that would prevent you from having lung cancer treatment.  Clinical breast exam.** / Every year after age 40 years.  BRCA-related cancer risk assessment.** / For women who have family members with a BRCA-related cancer (breast, ovarian, tubal, or peritoneal cancers).  Mammogram.** / Every year beginning at age 40 years and continuing for as long as you are in good health. Consult with your health care provider.  Pap test.** / Every 3 years starting at age 30 years through age 65 or 70 years with 3 consecutive normal Pap tests. Testing can be stopped between 65 and 70 years with 3 consecutive normal Pap tests and no abnormal Pap or HPV tests in the past 10 years.  HPV screening.** / Every 3 years from ages 30 years through ages 65 or 70 years with a history of 3 consecutive normal Pap tests. Testing can be stopped between 65 and 70 years with 3 consecutive normal Pap tests and no abnormal Pap or HPV tests in the past 10 years.  Fecal occult blood test (FOBT) of stool. / Every year beginning at age 50 years and continuing until age 75 years. You may not need to do this test if you get a colonoscopy every 10 years.  Flexible sigmoidoscopy or colonoscopy.** / Every 5 years for a flexible sigmoidoscopy or every 10 years for a colonoscopy beginning at age 50 years and continuing until age 75 years.  Hepatitis C blood test.** / For all people born from 1945 through 1965 and any individual with known risks for hepatitis C.  Osteoporosis screening.** / A one-time screening for women ages 65 years and over and women at risk for fractures or osteoporosis.  Skin self-exam. / Monthly.  Influenza vaccine. / Every year.  Tetanus, diphtheria, and acellular pertussis (Tdap/Td) vaccine.** / 1 dose of Td every 10 years.  Varicella vaccine.** / Consult your health care  provider.  Zoster vaccine.** / 1 dose for adults aged 60 years or older.  Pneumococcal 13-valent conjugate (PCV13) vaccine.** / Consult your health care provider.  Pneumococcal polysaccharide (PPSV23) vaccine.** / 1 dose for all adults aged 65 years and older.  Meningococcal vaccine.** / Consult your health care provider.  Hepatitis A vaccine.** / Consult your health care provider.  Hepatitis B vaccine.** / Consult your health care provider.  Haemophilus influenzae type b (Hib) vaccine.** / Consult   your health care provider. ** Family history and personal history of risk and conditions may change your health care provider's recommendations. Document Released: 08/14/2001 Document Revised: 11/02/2013 Document Reviewed: 11/13/2010 Exit[employer] Patient Information 2015 [employer]. This information is not intended to replace advice given to you by your health care provider. Make sure you discuss any questions you have with your health care provider.   

## 2014-10-11 ENCOUNTER — Encounter: Payer: Self-pay | Admitting: Nurse Practitioner

## 2014-10-11 ENCOUNTER — Ambulatory Visit (INDEPENDENT_AMBULATORY_CARE_PROVIDER_SITE_OTHER): Payer: Medicare HMO | Admitting: Nurse Practitioner

## 2014-10-11 VITALS — BP 138/72 | HR 81 | Temp 97.6°F | Ht 63.0 in | Wt 152.0 lb

## 2014-10-11 DIAGNOSIS — I10 Essential (primary) hypertension: Secondary | ICD-10-CM

## 2014-10-11 DIAGNOSIS — G4700 Insomnia, unspecified: Secondary | ICD-10-CM | POA: Diagnosis not present

## 2014-10-11 DIAGNOSIS — I2510 Atherosclerotic heart disease of native coronary artery without angina pectoris: Secondary | ICD-10-CM

## 2014-10-11 DIAGNOSIS — N393 Stress incontinence (female) (male): Secondary | ICD-10-CM | POA: Diagnosis not present

## 2014-10-11 DIAGNOSIS — F329 Major depressive disorder, single episode, unspecified: Secondary | ICD-10-CM

## 2014-10-11 DIAGNOSIS — E785 Hyperlipidemia, unspecified: Secondary | ICD-10-CM | POA: Diagnosis not present

## 2014-10-11 DIAGNOSIS — F32A Depression, unspecified: Secondary | ICD-10-CM

## 2014-10-11 DIAGNOSIS — E039 Hypothyroidism, unspecified: Secondary | ICD-10-CM | POA: Diagnosis not present

## 2014-10-11 DIAGNOSIS — Z23 Encounter for immunization: Secondary | ICD-10-CM

## 2014-10-11 DIAGNOSIS — F411 Generalized anxiety disorder: Secondary | ICD-10-CM | POA: Diagnosis not present

## 2014-10-11 DIAGNOSIS — M810 Age-related osteoporosis without current pathological fracture: Secondary | ICD-10-CM | POA: Diagnosis not present

## 2014-10-11 DIAGNOSIS — K219 Gastro-esophageal reflux disease without esophagitis: Secondary | ICD-10-CM | POA: Diagnosis not present

## 2014-10-11 MED ORDER — SIMVASTATIN 40 MG PO TABS: 40.0000 mg | ORAL_TABLET | Freq: Every day | ORAL | Status: DC

## 2014-10-11 MED ORDER — LISINOPRIL-HYDROCHLOROTHIAZIDE 10-12.5 MG PO TABS: 1.0000 | ORAL_TABLET | Freq: Every day | ORAL | Status: DC

## 2014-10-11 MED ORDER — PANTOPRAZOLE SODIUM 40 MG PO TBEC: 40.0000 mg | DELAYED_RELEASE_TABLET | Freq: Every day | ORAL | Status: DC

## 2014-10-11 MED ORDER — LEVOTHYROXINE SODIUM 25 MCG PO TABS: ORAL_TABLET | ORAL | Status: DC

## 2014-10-11 MED ORDER — CLONAZEPAM 0.5 MG PO TABS: 0.5000 mg | ORAL_TABLET | Freq: Two times a day (BID) | ORAL | Status: DC | PRN

## 2014-10-11 MED ORDER — ESCITALOPRAM OXALATE 20 MG PO TABS: 20.0000 mg | ORAL_TABLET | Freq: Every day | ORAL | Status: DC

## 2014-10-11 MED ORDER — ZOLPIDEM TARTRATE 5 MG PO TABS: 5.0000 mg | ORAL_TABLET | Freq: Every evening | ORAL | Status: DC | PRN

## 2014-10-11 NOTE — Progress Notes (Signed)
Subjective:    Patient ID: Kristen Munoz, female    DOB: 1946/04/13, 69 y.o.   MRN: [id number]  Patient here today for follow up of chronic medical problems. No acute complaint.   Hyperlipidemia This is a chronic problem. The current episode started more than 1 year ago. The problem is controlled. Pertinent negatives include no chest pain, myalgias or shortness of breath. Current antihyperlipidemic treatment includes statins. The current treatment provides no improvement of lipids. There are no compliance problems.  Risk factors for coronary artery disease include dyslipidemia, hypertension and post-menopausal.  Hypertension This is a chronic problem. The current episode started more than 1 year ago. The problem is controlled. Pertinent negatives include no chest pain, neck pain, palpitations or shortness of breath. There are no associated agents to hypertension. Risk factors for coronary artery disease include dyslipidemia. Past treatments include diuretics and ACE inhibitors. There are no compliance problems.  Hypertensive end-organ damage includes a thyroid problem.  Thyroid Problem Patient reports no diaphoresis, diarrhea, menstrual problem or palpitations. Her past medical history is significant for hyperlipidemia.  Depression Zoloft not working well- without side effects- Her husband had a stroke and she is having to take care of him and she seems more stressed then before. She reports the lexapro $RemoveBef'20mg'ZbGXMBjqeP$  is not working.  GERD Zegrid- works well - keeps symptoms under control Osteoporosis Suppose to get a prolia injection today   Review of Systems  Constitutional: Negative for diaphoresis.  Respiratory: Negative for shortness of breath.   Cardiovascular: Negative for chest pain and palpitations.  Gastrointestinal: Negative for diarrhea.  Genitourinary: Negative for menstrual problem.  Musculoskeletal: Negative for myalgias and neck pain.  All other systems reviewed and are negative.      Objective:   Physical Exam  Constitutional: She is oriented to person, place, and time. She appears well-developed and well-nourished.  HENT:  Nose: Nose normal.  Mouth/Throat: Oropharynx is clear and moist.  Eyes: EOM are normal.  Neck: Trachea normal, normal range of motion and full passive range of motion without pain. Neck supple. No JVD present. Carotid bruit is not present. No thyromegaly present.  Cardiovascular: Normal rate, regular rhythm, normal heart sounds and intact distal pulses.  Exam reveals no gallop and no friction rub.   No murmur heard. Pulmonary/Chest: Effort normal and breath sounds normal.  Abdominal: Soft. Bowel sounds are normal. She exhibits no distension and no mass. There is no tenderness.  Musculoskeletal: Normal range of motion.  Lymphadenopathy:    She has no cervical adenopathy.  Neurological: She is alert and oriented to person, place, and time. She has normal reflexes.  Skin: Skin is warm and dry.  Psychiatric: She has a normal mood and affect. Her behavior is normal. Judgment and thought content normal.    BP 138/72 mmHg  Pulse 81  Temp(Src) 97.6 F (36.4 C) (Oral)  Ht $R'5\' 3"'kw$  (1.6 m)  Wt 152 lb (68.947 kg)  BMI 26.93 kg/m2        Assessment & Plan:   1. Essential hypertension, benign Do not add salt to diet - CMP14+EGFR - lisinopril-hydrochlorothiazide (PRINZIDE,ZESTORETIC) 10-12.5 MG per tablet; Take 1 tablet by mouth daily.  Dispense: 90 tablet; Refill: 1  2. Coronary artery disease involving native coronary artery of native heart without angina pectoris  3. Gastroesophageal reflux disease, esophagitis presence not specified Avoid spicy foods - pantoprazole (PROTONIX) 40 MG tablet; Take 1 tablet (40 mg total) by mouth daily.  Dispense: 90 tablet; Refill: 1  4.  Hypothyroidism, unspecified hypothyroidism type - levothyroxine (SYNTHROID, LEVOTHROID) 25 MCG tablet; TAKE 1 TABLET (25 MCG TOTAL)  BY MOUTH DAILY BEFORE BREAKFAST.   Dispense: 90 tablet; Refill: 1  5. Osteoporosis Weight bearing exercises  6. Hyperlipidemia Low fat diet - NMR, lipoprofile - simvastatin (ZOCOR) 40 MG tablet; Take 1 tablet (40 mg total) by mouth daily.  Dispense: 90 tablet; Refill: 1  7. Insomnia Stop belsomra- causes grogginess all day Start ambien as rx - zolpidem (AMBIEN) 5 MG tablet; Take 1 tablet (5 mg total) by mouth at bedtime as needed for sleep.  Dispense: 15 tablet; Refill: 1  8. Depression - escitalopram (LEXAPRO) 20 MG tablet; Take 1 tablet (20 mg total) by mouth daily.  Dispense: 30 tablet; Refill: 0  9. Stress incontinence, female  66. GAD (generalized anxiety disorder) Stress management - clonazePAM (KLONOPIN) 0.5 MG tablet; Take 1 tablet (0.5 mg total) by mouth 2 (two) times daily as needed for anxiety.  Dispense: 60 tablet; Refill: 1    Labs pending Health maintenance reviewed Diet and exercise encouraged Continue all meds Follow up  In 3 mmonths   Mary-Margaret Hassell Done, FNP

## 2014-10-11 NOTE — Patient Instructions (Signed)

## 2014-10-11 NOTE — Addendum Note (Signed)
Addended by: Cleda DaubUCKER, Praise Dolecki G on: 10/11/2014 04:17 PM   Modules accepted: Orders

## 2014-10-12 LAB — CMP14+EGFR
ALT: 6 IU/L (ref 0–32)
AST: 9 IU/L (ref 0–40)
Albumin/Globulin Ratio: 1.6 (ref 1.1–2.5)
Albumin: 4.6 g/dL (ref 3.6–4.8)
Alkaline Phosphatase: 88 IU/L (ref 39–117)
BUN/Creatinine Ratio: 17 (ref 11–26)
BUN: 18 mg/dL (ref 8–27)
Bilirubin Total: 0.2 mg/dL (ref 0.0–1.2)
CHLORIDE: 96 mmol/L — AB (ref 97–108)
CO2: 23 mmol/L (ref 18–29)
Calcium: 10 mg/dL (ref 8.7–10.3)
Creatinine, Ser: 1.03 mg/dL — ABNORMAL HIGH (ref 0.57–1.00)
GFR, EST AFRICAN AMERICAN: 65 mL/min/{1.73_m2} (ref >59)
GFR, EST NON AFRICAN AMERICAN: 56 mL/min/{1.73_m2} — AB (ref >59)
Globulin, Total: 2.8 g/dL (ref 1.5–4.5)
Glucose: 85 mg/dL (ref 65–99)
Potassium: 4.4 mmol/L (ref 3.5–5.2)
Sodium: 135 mmol/L (ref 134–144)
Total Protein: 7.4 g/dL (ref 6.0–8.5)

## 2014-10-12 LAB — NMR, LIPOPROFILE
Cholesterol: 177 mg/dL (ref 100–199)
HDL Cholesterol by NMR: 54 mg/dL (ref >39)
HDL Particle Number: 34.3 umol/L (ref >=30.5)
LDL Particle Number: 856 nmol/L (ref <1000)
LDL SIZE: 20.9 nm (ref >20.5)
LDL-C: 78 mg/dL (ref 0–99)
LP-IR SCORE: 52 — AB (ref <=45)
SMALL LDL PARTICLE NUMBER: 444 nmol/L (ref <=527)
Triglycerides by NMR: 226 mg/dL — ABNORMAL HIGH (ref 0–149)

## 2014-10-14 ENCOUNTER — Telehealth: Payer: Self-pay

## 2014-10-14 NOTE — Telephone Encounter (Signed)
Insurance approved Zolpidem Tartrate 07/02/14 to 07/02/15

## 2015-01-28 ENCOUNTER — Ambulatory Visit (INDEPENDENT_AMBULATORY_CARE_PROVIDER_SITE_OTHER): Payer: Medicare HMO | Admitting: Family

## 2015-01-28 ENCOUNTER — Ambulatory Visit (INDEPENDENT_AMBULATORY_CARE_PROVIDER_SITE_OTHER): Payer: Medicare HMO

## 2015-01-28 ENCOUNTER — Encounter: Payer: Self-pay | Admitting: Family

## 2015-01-28 VITALS — BP 112/64 | HR 81 | Temp 98.3°F | Ht 63.0 in | Wt 152.2 lb

## 2015-01-28 DIAGNOSIS — M25572 Pain in left ankle and joints of left foot: Secondary | ICD-10-CM | POA: Diagnosis not present

## 2015-01-28 DIAGNOSIS — S93401A Sprain of unspecified ligament of right ankle, initial encounter: Secondary | ICD-10-CM

## 2015-01-28 MED ORDER — TRAMADOL HCL 50 MG PO TABS: 50.0000 mg | ORAL_TABLET | Freq: Three times a day (TID) | ORAL | Status: DC | PRN

## 2015-01-28 NOTE — Patient Instructions (Signed)

## 2015-01-28 NOTE — Progress Notes (Signed)
   Subjective:    Patient ID: Kristen Munoz, female    DOB: 1945/07/08, 69 y.o.   MRN: [id number]  Foot Injury  The incident occurred 12 to 24 hours ago. The incident occurred at home. The injury mechanism was a fall. The pain is present in the right ankle. The quality of the pain is described as aching. The pain is at a severity of 10/10. The pain is mild. The pain has been constant since onset. Associated symptoms include numbness and tingling. She reports no foreign bodies present. The symptoms are aggravated by weight bearing. She has tried acetaminophen for the symptoms. The treatment provided mild relief.      Review of Systems  Constitutional: Negative.   HENT: Negative.   Eyes: Negative.   Respiratory: Negative.  Negative for shortness of breath.   Cardiovascular: Negative.  Negative for palpitations.  Gastrointestinal: Negative.   Endocrine: Negative.   Genitourinary: Negative.   Musculoskeletal: Negative.   Neurological: Positive for tingling and numbness. Negative for headaches.  Hematological: Negative.   Psychiatric/Behavioral: Negative.   All other systems reviewed and are negative.      Objective:   Physical Exam  Constitutional: She is oriented to person, place, and time. She appears well-developed and well-nourished. No distress.  HENT:  Head: Normocephalic and atraumatic.  Right Ear: External ear normal.  Mouth/Throat: Oropharynx is clear and moist.  Eyes: Pupils are equal, round, and reactive to light.  Neck: Normal range of motion. Neck supple. No thyromegaly present.  Cardiovascular: Normal rate, regular rhythm, normal heart sounds and intact distal pulses.   No murmur heard. Pulmonary/Chest: Effort normal and breath sounds normal. No respiratory distress. She has no wheezes.  Abdominal: Soft. Bowel sounds are normal. She exhibits no distension. There is no tenderness.  Musculoskeletal: Normal range of motion. She exhibits edema (2+ swellingin right foot)  and tenderness.  ecchymosis   Neurological: She is alert and oriented to person, place, and time. She has normal reflexes. No cranial nerve deficit.  Skin: Skin is warm and dry.  Psychiatric: She has a normal mood and affect. Her behavior is normal. Judgment and thought content normal.  Vitals reviewed.  X-ray right foot- Swelling on right foot no fracture noted-Preliminary reading by Jannifer Rodney, FNP [facility]    BP 112/64 mmHg  Pulse 81  Temp(Src) 98.3 F (36.8 C) (Oral)  Ht  (1.6 m)  Wt 152 lb 3.2 oz (69.037 kg)  BMI 26.97 kg/m2     Assessment & Plan:  1. Pain in joint, ankle and foot, left - DG Ankle Complete Left; Future  2. Right ankle sprain, initial encounter -Rest -Ice -Keep elevated -Compression -Wear brace if helps -Motrin or Tylenol prn for pain and Ultram only for moderate pain -RTO prn - traMADol (ULTRAM) 50 MG tablet; Take 1 tablet (50 mg total) by mouth every 8 (eight) hours as needed.  Dispense: 60 tablet; Refill: 0  Jannifer Rodney, FNP

## 2015-01-31 ENCOUNTER — Encounter: Payer: Self-pay | Admitting: *Deleted

## 2015-02-11 ENCOUNTER — Ambulatory Visit (INDEPENDENT_AMBULATORY_CARE_PROVIDER_SITE_OTHER): Payer: Medicare HMO

## 2015-02-11 DIAGNOSIS — M810 Age-related osteoporosis without current pathological fracture: Secondary | ICD-10-CM | POA: Diagnosis not present

## 2015-02-26 ENCOUNTER — Other Ambulatory Visit: Payer: Self-pay | Admitting: Nurse Practitioner

## 2015-02-28 NOTE — Telephone Encounter (Signed)
Please call in klonopin with 0 refills 

## 2015-02-28 NOTE — Telephone Encounter (Signed)
Called to [facility] 

## 2015-02-28 NOTE — Telephone Encounter (Signed)
Last filled 01/05/15, last seen 10/11/14. Pt uses [facility]

## 2015-03-10 ENCOUNTER — Telehealth: Payer: Self-pay | Admitting: Pharmacist

## 2015-03-10 NOTE — Telephone Encounter (Signed)
lft information for next appt for Prolia injection with patient's daughter - appt 03/23/15 at 2:45pm

## 2015-03-23 ENCOUNTER — Encounter (INDEPENDENT_AMBULATORY_CARE_PROVIDER_SITE_OTHER): Payer: Self-pay

## 2015-03-23 ENCOUNTER — Ambulatory Visit (INDEPENDENT_AMBULATORY_CARE_PROVIDER_SITE_OTHER): Payer: Medicare HMO | Admitting: Pharmacist

## 2015-03-23 DIAGNOSIS — M810 Age-related osteoporosis without current pathological fracture: Secondary | ICD-10-CM

## 2015-03-23 MED ORDER — DENOSUMAB 60 MG/ML ~~LOC~~ SOLN
60.0000 mg | Freq: Once | SUBCUTANEOUS | Status: AC
  Administered 2015-03-23: 60 mg via SUBCUTANEOUS

## 2015-03-23 NOTE — Progress Notes (Signed)
Patient ID: Kristen Munoz, female   DOB: 1946/04/16, 69 y.o.   MRN: [id number]  [facility]   HPI: Patient with osteoporosis has been getting Prolia  SQ q 6 months since 11/2012. She is due to get today.  Back Pain?  Yes       Kyphosis?  No Prior fracture?  No Med(s) for Osteoporosis/Osteopenia:  Prolia  sq q 6 months Med(s) previously tried for Osteoporosis/Osteopenia:  Calcium - severe constipation,                                         Fosamax - constipation                                        Boniva  q month - worsened GERD                                                              PMH: Age at menopause:  Surgical in 1976 Hysterectomy?  Yes Oophorectomy?  No HRT? Yes - Former.  Type/duration: estrogen Steroid Use?  No Thyroid med?  Yes History of cancer?  No  History of digestive disorders (ie Crohn's)?  Yes - GERD, currently taking PPI Current or previous eating disorders?  No Last Vitamin D Result:  32.4 (11/2008) Last GFR Result:  56 (10/11/2014)   FH/SH: Family history of osteoporosis?  Yes - mother Parent with history of hip fracture?  No Family history of breast cancer?  No Exercise?  No Smoking?  Yes  - has tried gum and patches to quit - successful for a few months but then restarted.  Alcohol?  No    Calcium Assessment Calcium Intake  # of servings/day  Calcium mg  Milk (8 oz) 0  x  300  = 0  Yogurt (4 oz) 2 x  200 =   Cheese (1 oz) 1 x  200 =   Other Calcium sources     Ca supplement  =     Estimated calcium intake per day     DEXA Results Date of Test T-Score for AP Spine L1-L4 T-Score for Total Left Hip T-Score for Total Right Hip  02/11/2015 -1.7 -1.6 -1.6  10/29/2012 -1.8 -1.9 -2.0  12/15/2008 -2.5 -1.7 -1.8  10/14/2006 -2.6 -1.9 -2.1  02/24/1998 -2.0 -1.5 --      Assessment: Osteoporosis with improved BMD of spine and both hips since starting Prolia High fracture risk and low calcium  intake  Recommendations: 1.  Continue Prolia  SQ q 6 months - given today.  Next due 08/22/2014 2.  recommend calcium  daily either through supplementation or diet.   3.  recommend weight bearing exercise - 30 minutes at least 4 days per week.   4.  Counseled and educated about fall risk and prevention. 5.  Appt made for influenza vaccine 03/31/2015  Recheck DEXA:  2 years  Time spent counseling patient:  20 minutes  Henrene Pastor, PharmD, CPP

## 2015-03-23 NOTE — Patient Instructions (Signed)

## 2015-03-29 ENCOUNTER — Other Ambulatory Visit: Payer: Self-pay | Admitting: Nurse Practitioner

## 2015-03-31 ENCOUNTER — Ambulatory Visit (INDEPENDENT_AMBULATORY_CARE_PROVIDER_SITE_OTHER): Payer: Medicare HMO

## 2015-03-31 DIAGNOSIS — Z23 Encounter for immunization: Secondary | ICD-10-CM

## 2015-05-02 ENCOUNTER — Other Ambulatory Visit: Payer: Self-pay | Admitting: Family

## 2015-06-08 ENCOUNTER — Other Ambulatory Visit: Payer: Self-pay | Admitting: Nurse Practitioner

## 2015-06-08 ENCOUNTER — Other Ambulatory Visit: Payer: Self-pay | Admitting: Family

## 2015-06-09 NOTE — Telephone Encounter (Signed)
Last seen 01/28/15  Kristen Munoz 

## 2015-06-09 NOTE — Telephone Encounter (Signed)
Last seen 01/28/15  Neysa BonitoChristy  Last thyroid  02/15/14

## 2015-06-13 NOTE — Telephone Encounter (Signed)
Klonopin called to [facility] pt notified

## 2015-06-13 NOTE — Telephone Encounter (Signed)
Meds refilled. Patient NTBS for follow up and lab work

## 2015-08-08 ENCOUNTER — Other Ambulatory Visit: Payer: Self-pay | Admitting: *Deleted

## 2015-08-08 DIAGNOSIS — E785 Hyperlipidemia, unspecified: Secondary | ICD-10-CM

## 2015-08-08 NOTE — Telephone Encounter (Signed)
Detailed message left for patient that she needs to be seen.  

## 2015-08-08 NOTE — Telephone Encounter (Signed)
zocor denied- Patient NTBS for follow up and lab work  

## 2015-08-08 NOTE — Telephone Encounter (Signed)
Last labs 10/2014 

## 2015-08-25 ENCOUNTER — Encounter: Payer: Self-pay | Admitting: Nurse Practitioner

## 2015-08-25 ENCOUNTER — Ambulatory Visit (INDEPENDENT_AMBULATORY_CARE_PROVIDER_SITE_OTHER): Payer: PPO | Admitting: Nurse Practitioner

## 2015-08-25 VITALS — BP 123/81 | HR 86 | Temp 97.0°F | Ht 63.0 in | Wt 152.0 lb

## 2015-08-25 DIAGNOSIS — F329 Major depressive disorder, single episode, unspecified: Secondary | ICD-10-CM | POA: Diagnosis not present

## 2015-08-25 DIAGNOSIS — F411 Generalized anxiety disorder: Secondary | ICD-10-CM | POA: Diagnosis not present

## 2015-08-25 DIAGNOSIS — K219 Gastro-esophageal reflux disease without esophagitis: Secondary | ICD-10-CM

## 2015-08-25 DIAGNOSIS — I10 Essential (primary) hypertension: Secondary | ICD-10-CM

## 2015-08-25 DIAGNOSIS — E785 Hyperlipidemia, unspecified: Secondary | ICD-10-CM

## 2015-08-25 DIAGNOSIS — G4700 Insomnia, unspecified: Secondary | ICD-10-CM

## 2015-08-25 DIAGNOSIS — M810 Age-related osteoporosis without current pathological fracture: Secondary | ICD-10-CM | POA: Diagnosis not present

## 2015-08-25 DIAGNOSIS — Z1159 Encounter for screening for other viral diseases: Secondary | ICD-10-CM | POA: Diagnosis not present

## 2015-08-25 DIAGNOSIS — Z1212 Encounter for screening for malignant neoplasm of rectum: Secondary | ICD-10-CM

## 2015-08-25 DIAGNOSIS — E039 Hypothyroidism, unspecified: Secondary | ICD-10-CM

## 2015-08-25 DIAGNOSIS — I2510 Atherosclerotic heart disease of native coronary artery without angina pectoris: Secondary | ICD-10-CM | POA: Diagnosis not present

## 2015-08-25 DIAGNOSIS — N393 Stress incontinence (female) (male): Secondary | ICD-10-CM

## 2015-08-25 DIAGNOSIS — F32A Depression, unspecified: Secondary | ICD-10-CM

## 2015-08-25 MED ORDER — PANTOPRAZOLE SODIUM 40 MG PO TBEC: 40.0000 mg | DELAYED_RELEASE_TABLET | Freq: Every day | ORAL | Status: DC

## 2015-08-25 MED ORDER — SIMVASTATIN 40 MG PO TABS: 40.0000 mg | ORAL_TABLET | Freq: Every day | ORAL | Status: DC

## 2015-08-25 MED ORDER — LEVOTHYROXINE SODIUM 25 MCG PO TABS: 25.0000 ug | ORAL_TABLET | Freq: Every day | ORAL | Status: DC

## 2015-08-25 MED ORDER — CLONAZEPAM 0.5 MG PO TABS: 0.5000 mg | ORAL_TABLET | Freq: Two times a day (BID) | ORAL | Status: DC | PRN

## 2015-08-25 MED ORDER — LISINOPRIL-HYDROCHLOROTHIAZIDE 10-12.5 MG PO TABS: 1.0000 | ORAL_TABLET | Freq: Every day | ORAL | Status: DC

## 2015-08-25 NOTE — Progress Notes (Signed)
Subjective:    Patient ID: Kristen Munoz, female    DOB: 01/11/1946, 70 y.o.   MRN: [id number]  Patient here today for follow up of chronic medical problems.  Outpatient Encounter Prescriptions as of 08/25/2015  Medication Sig  . aspirin EC 81 MG tablet Take 81 mg by mouth daily.  . clonazePAM (KLONOPIN) 0.5 MG tablet TAKE ONE TABLET BY MOUTH TWICE DAILY AS NEEDED FOR ANXIETY  . escitalopram (LEXAPRO) 20 MG tablet Take 1 tablet (20 mg total) by mouth daily.  Marland Kitchen escitalopram (LEXAPRO) 20 MG tablet TAKE ONE TABLET BY MOUTH ONCE DAILY  . levothyroxine (SYNTHROID, LEVOTHROID) 25 MCG tablet TAKE ONE TABLET BY MOUTH ONCE DAILY BEFORE BREAKFAST  . lisinopril-hydrochlorothiazide (PRINZIDE,ZESTORETIC) 10-12.5 MG per tablet Take 1 tablet by mouth daily.  . pantoprazole (PROTONIX) 40 MG tablet Take 1 tablet (40 mg total) by mouth daily.  . simvastatin (ZOCOR) 40 MG tablet Take 1 tablet (40 mg total) by mouth daily.   No facility-administered encounter medications on file as of 08/25/2015.    Hyperlipidemia This is a chronic problem. The current episode started more than 1 year ago. The problem is controlled. Pertinent negatives include no chest pain, myalgias or shortness of breath. Current antihyperlipidemic treatment includes statins. The current treatment provides no improvement of lipids. There are no compliance problems.  Risk factors for coronary artery disease include dyslipidemia, hypertension and post-menopausal.  Hypertension This is a chronic problem. The current episode started more than 1 year ago. The problem is controlled. Pertinent negatives include no chest pain, neck pain, palpitations or shortness of breath. There are no associated agents to hypertension. Risk factors for coronary artery disease include dyslipidemia. Past treatments include diuretics and ACE inhibitors. There are no compliance problems.  Hypertensive end-organ damage includes a thyroid problem.  Thyroid Problem Patient  reports no diaphoresis, diarrhea, menstrual problem or palpitations. Her past medical history is significant for hyperlipidemia.  Depression Zoloft not working well- without side effects- Her husband had a stroke and she is having to take care of him and she seems more stressed then before. She reports the lexapro 18m is not working.  GERD Zegrid- works well - keeps symptoms under control Osteoporosis Suppose to get a prolia injection today   Review of Systems  Constitutional: Negative for diaphoresis.  Respiratory: Negative for shortness of breath.   Cardiovascular: Negative for chest pain and palpitations.  Gastrointestinal: Negative for diarrhea.  Genitourinary: Negative for menstrual problem.  Musculoskeletal: Negative for myalgias and neck pain.  All other systems reviewed and are negative.      Objective:   Physical Exam  Constitutional: She is oriented to person, place, and time. She appears well-developed and well-nourished.  HENT:  Nose: Nose normal.  Mouth/Throat: Oropharynx is clear and moist.  Eyes: EOM are normal.  Neck: Trachea normal, normal range of motion and full passive range of motion without pain. Neck supple. No JVD present. Carotid bruit is not present. No thyromegaly present.  Cardiovascular: Normal rate, regular rhythm, normal heart sounds and intact distal pulses.  Exam reveals no gallop and no friction rub.   No murmur heard. Pulmonary/Chest: Effort normal and breath sounds normal.  Abdominal: Soft. Bowel sounds are normal. She exhibits no distension and no mass. There is no tenderness.  Musculoskeletal: Normal range of motion.  Lymphadenopathy:    She has no cervical adenopathy.  Neurological: She is alert and oriented to person, place, and time. She has normal reflexes.  Skin: Skin is warm and  dry.  Psychiatric: She has a normal mood and affect. Her behavior is normal. Judgment and thought content normal.    BP 123/81 mmHg  Pulse 86  Temp(Src) 97  F (36.1 C) (Oral)  Ht _0  (1.6 m)  Wt 152 lb (68.947 kg)  BMI 26.93 kg/m2      Assessment & Plan:  1. Coronary artery disease involving native coronary artery of native heart without angina pectoris  2. Essential hypertension, benign Do not add salt to diet - lisinopril-hydrochlorothiazide (PRINZIDE,ZESTORETIC) 10-12.5 MG tablet; Take 1 tablet by mouth daily.  Dispense: 90 tablet; Refill: 1 - CMP14+EGFR  3. Gastroesophageal reflux disease, esophagitis presence not specified Avoid spicy foods Do not eat 2 hours prior to bedtime - pantoprazole (PROTONIX) 40 MG tablet; Take 1 tablet (40 mg total) by mouth daily.  Dispense: 90 tablet; Refill: 1  4. Hypothyroidism, unspecified hypothyroidism type - levothyroxine (SYNTHROID, LEVOTHROID) 25 MCG tablet; Take 1 tablet (25 mcg total) by mouth daily before breakfast.  Dispense: 90 tablet; Refill: 1  5. Osteoporosis Weight bearing exerises - Lipid panel  6. Hyperlipidemia Low fat diet - simvastatin (ZOCOR) 40 MG tablet; Take 1 tablet (40 mg total) by mouth daily.  Dispense: 90 tablet; Refill: 1  7. Depression Stress management  8. Insomnia Bedtime ritual  9. GAD (generalized anxiety disorder) Stress management - clonazePAM (KLONOPIN) 0.5 MG tablet; Take 1 tablet (0.5 mg total) by mouth 2 (two) times daily as needed. for anxiety  Dispense: 60 tablet; Refill: 1  10. Stress incontinence, female  62. Screening for malignant neoplasm of the rectum - Fecal occult blood, imunochemical; Future  12. Need for hepatitis C screening test - Hepatitis C antibody    Labs pending Health maintenance reviewed Diet and exercise encouraged Continue all meds Follow up  In 3 month   [place], FNP

## 2015-08-25 NOTE — Patient Instructions (Signed)
Fall Prevention in the Home  Falls can cause injuries and can affect people from all age groups. There are many simple things that you can do to make your home safe and to help prevent falls. WHAT CAN I DO ON THE OUTSIDE OF MY HOME?  Regularly repair the edges of walkways and driveways and fix any cracks.  Remove high doorway thresholds.  Trim any shrubbery on the main path into your home.  Use bright outdoor lighting.  Clear walkways of debris and clutter, including tools and rocks.  Regularly check that handrails are securely fastened and in good repair. Both sides of any steps should have handrails.  Install guardrails along the edges of any raised decks or porches.  Have leaves, snow, and ice cleared regularly.  Use sand or salt on walkways during winter months.  In the garage, clean up any spills right away, including grease or oil spills. WHAT CAN I DO IN THE BATHROOM?  Use night lights.  Install grab bars by the toilet and in the tub and shower. Do not use towel bars as grab bars.  Use non-skid mats or decals on the floor of the tub or shower.  If you need to sit down while you are in the shower, use a plastic, non-slip stool..  Keep the floor dry. Immediately clean up any water that spills on the floor.  Remove soap buildup in the tub or shower on a regular basis.  Attach bath mats securely with double-sided non-slip rug tape.  Remove throw rugs and other tripping hazards from the floor. WHAT CAN I DO IN THE BEDROOM?  Use night lights.  Make sure that a bedside light is easy to reach.  Do not use oversized bedding that drapes onto the floor.  Have a firm chair that has side arms to use for getting dressed.  Remove throw rugs and other tripping hazards from the floor. WHAT CAN I DO IN THE KITCHEN?   Clean up any spills right away.  Avoid walking on wet floors.  Place frequently used items in easy-to-reach places.  If you need to reach for something  above you, use a sturdy step stool that has a grab bar.  Keep electrical cables out of the way.  Do not use floor polish or wax that makes floors slippery. If you have to use wax, make sure that it is non-skid floor wax.  Remove throw rugs and other tripping hazards from the floor. WHAT CAN I DO IN THE STAIRWAYS?  Do not leave any items on the stairs.  Make sure that there are handrails on both sides of the stairs. Fix handrails that are broken or loose. Make sure that handrails are as long as the stairways.  Check any carpeting to make sure that it is firmly attached to the stairs. Fix any carpet that is loose or worn.  Avoid having throw rugs at the top or bottom of stairways, or secure the rugs with carpet tape to prevent them from moving.  Make sure that you have a light switch at the top of the stairs and the bottom of the stairs. If you do not have them, have them installed. WHAT ARE SOME OTHER FALL PREVENTION TIPS?  Wear closed-toe shoes that fit well and support your feet. Wear shoes that have rubber soles or low heels.  When you use a stepladder, make sure that it is completely opened and that the sides are firmly locked. Have someone hold the ladder while you   are using it. Do not climb a closed stepladder.  Add color or contrast paint or tape to grab bars and handrails in your home. Place contrasting color strips on the first and last steps.  Use mobility aids as needed, such as canes, walkers, scooters, and crutches.  Turn on lights if it is dark. Replace any light bulbs that burn out.  Set up furniture so that there are clear paths. Keep the furniture in the same spot.  Fix any uneven floor surfaces.  Choose a carpet design that does not hide the edge of steps of a stairway.  Be aware of any and all pets.  Review your medicines with your healthcare provider. Some medicines can cause dizziness or changes in blood pressure, which increase your risk of falling. Talk  with your health care provider about other ways that you can decrease your risk of falls. This may include working with a physical therapist or trainer to improve your strength, balance, and endurance.   This information is not intended to replace advice given to you by your health care provider. Make sure you discuss any questions you have with your health care provider.   Document Released: 06/08/2002 Document Revised: 11/02/2014 Document Reviewed: 07/23/2014 Elsevier Interactive Patient Education 2016 [employer]  

## 2015-08-26 LAB — CMP14+EGFR
A/G RATIO: 1.8 (ref 1.1–2.5)
ALT: 8 IU/L (ref 0–32)
AST: 11 IU/L (ref 0–40)
Albumin: 4.6 g/dL (ref 3.6–4.8)
Alkaline Phosphatase: 74 IU/L (ref 39–117)
BILIRUBIN TOTAL: 0.3 mg/dL (ref 0.0–1.2)
BUN/Creatinine Ratio: 13 (ref 11–26)
BUN: 15 mg/dL (ref 8–27)
CHLORIDE: 96 mmol/L (ref 96–106)
CO2: 24 mmol/L (ref 18–29)
Calcium: 9.4 mg/dL (ref 8.7–10.3)
Creatinine, Ser: 1.16 mg/dL — ABNORMAL HIGH (ref 0.57–1.00)
GFR calc Af Amer: 56 mL/min/{1.73_m2} — ABNORMAL LOW (ref >59)
GFR calc non Af Amer: 48 mL/min/{1.73_m2} — ABNORMAL LOW (ref >59)
GLOBULIN, TOTAL: 2.6 g/dL (ref 1.5–4.5)
Glucose: 85 mg/dL (ref 65–99)
POTASSIUM: 4.1 mmol/L (ref 3.5–5.2)
SODIUM: 137 mmol/L (ref 134–144)
TOTAL PROTEIN: 7.2 g/dL (ref 6.0–8.5)

## 2015-08-26 LAB — LIPID PANEL
CHOLESTEROL TOTAL: 221 mg/dL — AB (ref 100–199)
Chol/HDL Ratio: 4.8 ratio units — ABNORMAL HIGH (ref 0.0–4.4)
HDL: 46 mg/dL (ref >39)
LDL Calculated: 126 mg/dL — ABNORMAL HIGH (ref 0–99)
TRIGLYCERIDES: 243 mg/dL — AB (ref 0–149)
VLDL Cholesterol Cal: 49 mg/dL — ABNORMAL HIGH (ref 5–40)

## 2015-08-26 LAB — HEPATITIS C ANTIBODY: Hep C Virus Ab: <0.1 s/co ratio (ref 0.0–0.9)

## 2015-09-27 ENCOUNTER — Telehealth: Payer: Self-pay

## 2015-09-27 NOTE — Telephone Encounter (Signed)
I did prior auth for Prolia on Cover my meds and it approved through 07/01/16

## 2015-10-07 ENCOUNTER — Encounter: Payer: Self-pay | Admitting: Pharmacist

## 2015-10-07 ENCOUNTER — Ambulatory Visit (INDEPENDENT_AMBULATORY_CARE_PROVIDER_SITE_OTHER): Payer: PPO | Admitting: Pharmacist

## 2015-10-07 ENCOUNTER — Encounter (INDEPENDENT_AMBULATORY_CARE_PROVIDER_SITE_OTHER): Payer: Self-pay

## 2015-10-07 VITALS — BP 120/68 | HR 80 | Ht 63.0 in | Wt 155.0 lb

## 2015-10-07 DIAGNOSIS — Z0000 Encounter for general adult medical examination without abnormal findings: Secondary | ICD-10-CM

## 2015-10-07 DIAGNOSIS — M810 Age-related osteoporosis without current pathological fracture: Secondary | ICD-10-CM | POA: Diagnosis not present

## 2015-10-07 DIAGNOSIS — Z1239 Encounter for other screening for malignant neoplasm of breast: Secondary | ICD-10-CM

## 2015-10-07 DIAGNOSIS — Z1212 Encounter for screening for malignant neoplasm of rectum: Secondary | ICD-10-CM

## 2015-10-07 MED ORDER — DENOSUMAB 60 MG/ML ~~LOC~~ SOLN
60.0000 mg | Freq: Once | SUBCUTANEOUS | Status: AC
  Administered 2015-10-07: 60 mg via SUBCUTANEOUS

## 2015-10-07 NOTE — Addendum Note (Signed)
Addended by: Margorie JohnJOHNSON, Tredarius Cobern M on: 10/07/2015 05:42 PM   Modules accepted: Orders

## 2015-10-07 NOTE — Progress Notes (Signed)
Patient ID: Kristen Munoz, female   DOB: 1945/07/26, 70 y.o.   MRN: [id number]   Subjective:   Kristen Munoz is a 70 y.o. female who presents for a subsequent Medicare Annual Wellness Visit, follow up osteoporosis and for Prolia injection.  Kristen Munoz lives [place] her [place]  She is her husbands around the clock care giver.   Review of Systems  Review of Systems  Constitutional: Negative.   HENT: Negative.   Eyes: Negative.   Cardiovascular: Negative.   Gastrointestinal: Positive for heartburn.  Genitourinary: Negative.   Musculoskeletal: Positive for back pain (improving - started after moved furniture) and joint pain (hip pain ).  Skin: Negative.      Current Medications (verified) Outpatient Encounter Prescriptions as of 10/07/2015  Medication Sig  . aspirin EC 81 MG tablet Take 81 mg by mouth daily.  . clonazePAM (KLONOPIN) 0.5 MG tablet Take 1 tablet (0.5 mg total) by mouth 2 (two) times daily as needed. for anxiety  . denosumab (PROLIA) 60 MG/ML SOLN injection Inject 60 mg into the skin every 6 (six) months. Administer in upper arm, thigh, or abdomen  . escitalopram (LEXAPRO) 20 MG tablet TAKE ONE TABLET BY MOUTH ONCE DAILY  . levothyroxine (SYNTHROID, LEVOTHROID) 25 MCG tablet Take 1 tablet (25 mcg total) by mouth daily before breakfast.  . lisinopril-hydrochlorothiazide (PRINZIDE,ZESTORETIC) 10-12.5 MG tablet Take 1 tablet by mouth daily.  . pantoprazole (PROTONIX) 40 MG tablet Take 1 tablet (40 mg total) by mouth daily.  . simvastatin (ZOCOR) 40 MG tablet Take 1 tablet (40 mg total) by mouth daily.  . [EXPIRED] denosumab (PROLIA) injection 60 mg    No facility-administered encounter medications on file as of 10/07/2015.    Allergies (verified) Sulfa antibiotics   History: Past Medical History  Diagnosis Date  . Hyperlipidemia   . Hypertension   . Thyroid disease   . GERD (gastroesophageal reflux disease)   . Osteoporosis   . CAD (coronary artery  disease)   . Urinary bladder incontinence    Past Surgical History  Procedure Laterality Date  . Abdominal hysterectomy    . Thyroid surgery    . Vesicovaginal fistula closure w/ tah     Family History  Problem Relation Age of Onset  . Cancer Mother     colon  . Hypertension Mother   . Diabetes Mother   . Cancer Father     lung  . Diabetes Father   . Hypertension Father   . Cancer Sister     bone  . Heart failure Sister   . Cancer Brother     kidney  . Drug abuse Paternal Uncle   . Early death Brother     gun shot   Social History   Occupational History  . Not on file.   Social History Main Topics  . Smoking status: Current Every Day Smoker -- 1.00 packs/day for 38 years    Types: Cigarettes    Start date: 07/02/1974  . Smokeless tobacco: Never Used  . Alcohol Use: No  . Drug Use: No  . Sexual Activity: No    Do you feel safe at home?  Yes Are there smokers in your home (other than you)? No  Dietary issues and exercise activities: Current Exercise Habits: The patient does not participate in regular exercise at present  Current Dietary habits:  Limits fried and high fat foods.    Objective:    Today's Vitals   10/07/15 1629  BP: 120/68  Pulse: 80  Height:  (1.6 m)  Weight: 155 lb (70.308 kg)  PainSc: 2   PainLoc: Back   Body mass index is 27.46 kg/(m^2).  Activities of Daily Living In your present state of health, do you have any difficulty performing the following activities: 10/07/2015 10/11/2014  Hearing? N N  Vision? N N  Difficulty concentrating or making decisions? N N  Walking or climbing stairs? N N  Dressing or bathing? N N  Doing errands, shopping? N N  Preparing Food and eating ? N -  Using the Toilet? N -  In the past six months, have you accidently leaked urine? N -  Do you have problems with loss of bowel control? N -  Managing your Medications? N -  Managing your Finances? N -  Housekeeping or managing your Housekeeping? N  -     Cardiac Risk Factors include: advanced age (>66men, >79 women);dyslipidemia;smoking/ tobacco exposure;sedentary lifestyle  Depression Screen PHQ 2/9 Scores 10/07/2015 08/25/2015 10/11/2014 09/17/2014  PHQ - 2 Score 2 0 6 2  PHQ- 9 Score 3 - 22 4     Fall Risk Fall Risk  10/07/2015 08/25/2015 10/11/2014 09/17/2014 06/21/2014  Falls in the past year? No No No Yes No  Number falls in past yr: - - - 1 -  Injury with Fall? - - - No -  Follow up - - - Falls prevention discussed -    Cognitive Function: MMSE - Mini Mental State Exam 10/07/2015 09/17/2014  Orientation to time 5 5  Orientation to Place 5 5  Registration 3 3  Attention/ Calculation 3 5  Recall 3 3  Language- name 2 objects 2 2  Language- repeat 1 1  Language- follow 3 step command 3 3  Language- read & follow direction 1 1  Write a sentence 1 1  Copy design 1 1  Total score 28 30    Immunizations and Health Maintenance Immunization History  Administered Date(s) Administered  . Influenza,inj,Quad PF,36+ Mos 04/22/2013, 06/05/2014, 03/31/2015  . Pneumococcal Conjugate-13 10/11/2014  . Pneumococcal Polysaccharide-23 05/03/2011  . Zoster 09/17/2014   Health Maintenance Due  Topic Date Due  . COLON CANCER SCREENING ANNUAL FOBT  12/29/1995    Patient Care Team: [name], FNP as PCP - General (Nurse Practitioner) Claretta Fraise Mardene Sayer, MD as Referring Physician (Optometry)  Indicate any recent Medical Services you may have received from other than [facility] providers in the past year (date may be approximate).    Assessment:    Annual Wellness Visit  Osteoporosis   Screening Tests Health Maintenance  Topic Date Due  . COLON CANCER SCREENING ANNUAL FOBT  12/29/1995  . PNA vac Low Risk Adult (2 of 2 - PPSV23) 10/11/2015  . INFLUENZA VACCINE  01/31/2016  . MAMMOGRAM  09/16/2016  . DEXA SCAN  02/10/2017  . TETANUS/TDAP  04/11/2019  . COLONOSCOPY  09/04/2022  . ZOSTAVAX  Completed  . Hepatitis C Screening   Completed      Plan:   During the course of the visit Kristen Munoz was educated and counseled about the following appropriate screening and preventive services:   Vaccines to include Pneumoccal, Influenza, Td, Zostavax - vaccines are UTD  Colorectal cancer screening - patient has done FOBT but left test at home - will return later today.  Colonoscopy is UTD  Diabetes screening UTD  Bone Denisty / Osteoporosis Screening - uTD  Prolia  administered SQ today in office.   Mammogram -  patient never got last year - referral sent again today  Glaucoma screening / Eye Exam - last checked 2 years ago  Nutrition counseling - increase vegetables and fruits to at least 5 per day.  Increase calcium in diet and supplement as needed to get  daily.  Smoking cessation counseling - discussed options for smoking cessation.  Patient declined any pharmacotherapy today.  Advanced Directives - info reviewed and packet given  Physical Activity - start daily exercise such as walking.  Rx written by PCP for back support to help protect when lifting husband.  May use APAP  1-2 tablet up to tid as needed for pain.  Patient Instructions (the written plan) were given to the patient.   Henrene Pastor, [facility]   10/07/2015

## 2015-10-07 NOTE — Patient Instructions (Signed)
Kristen Munoz , Thank you for taking time to come for your Medicare Wellness Visit. I appreciate your ongoing commitment to your health goals. Please review the following plan we discussed and let me know if I can assist you in the future.   These are the goals we discussed: We are sending in referral for mammogram to the [facility].  Try to increase physical activity - goal is 150 minutes per week.    This is a list of the screening recommended for you and due dates:  Health Maintenance  Topic Date Due  . Stool Blood Test  12/29/1995  . Pneumonia vaccines (2 of 2 - PPSV23) Completed  . Flu Shot  01/31/2016  . Mammogram  09/16/2016  . DEXA scan (bone density measurement)  02/10/2017  . Tetanus Vaccine  04/11/2019  . Colon Cancer Screening  09/04/2022  . Shingles Vaccine  Completed  .  Hepatitis C: One time screening is recommended by Center for Disease Control  (CDC) for  adults born from [birth year] through 1965.   Completed   Health Maintenance, Female Adopting a healthy lifestyle and getting preventive care can go a long way to promote health and wellness. Talk with your health care provider about what schedule of regular examinations is right for you. This is a good chance for you to check in with your provider about disease prevention and staying healthy. In between checkups, there are plenty of things you can do on your own. Experts have done a lot of research about which lifestyle changes and preventive measures are most likely to keep you healthy. Ask your health care provider for more information. WEIGHT AND DIET  Eat a healthy diet  Be sure to include plenty of vegetables, fruits, low-fat dairy products, and lean protein.  Do not eat a lot of foods high in solid fats, added sugars, or salt.  Get regular exercise. This is one of the most important things you can do for your health.  Most adults should exercise for at least 150 minutes each week. The exercise should increase your  heart rate and make you sweat (moderate-intensity exercise).  Most adults should also do strengthening exercises at least twice a week. This is in addition to the moderate-intensity exercise.  Maintain a healthy weight  Body mass index (BMI) is a measurement that can be used to identify possible weight problems. It estimates body fat based on height and weight. Your health care provider can help determine your BMI and help you achieve or maintain a healthy weight.  For females 2 years of age and older:   A BMI below 18.5 is considered underweight.  A BMI of 18.5 to 24.9 is normal.  A BMI of 25 to 29.9 is considered overweight.  A BMI of 30 and above is considered obese.  Watch levels of cholesterol and blood lipids  You should start having your blood tested for lipids and cholesterol at 70 years of age, then have this test every 5 years.  You may need to have your cholesterol levels checked more often if:  Your lipid or cholesterol levels are high.  You are older than 70 years of age.  You are at high risk for heart disease.  CANCER SCREENING   Lung Cancer  Lung cancer screening is recommended for adults 1-25 years old who are at high risk for lung cancer because of a history of smoking.  A yearly low-dose CT scan of the lungs is recommended for people who:  Currently smoke.  Have quit within the past 15 years.  Have at least a 30-pack-year history of smoking. A pack year is smoking an average of one pack of cigarettes a day for 1 year.  Yearly screening should continue until it has been 15 years since you quit.  Yearly screening should stop if you develop a health problem that would prevent you from having lung cancer treatment.  Breast Cancer  Practice breast self-awareness. This means understanding how your breasts normally appear and feel.  It also means doing regular breast self-exams. Let your health care provider know about any changes, no matter how  small.  If you are in your 20s or 30s, you should have a clinical breast exam (CBE) by a health care provider every 1-3 years as part of a regular health exam.  If you are 14 or older, have a CBE every year. Also consider having a breast X-ray (mammogram) every year.  If you have a family history of breast cancer, talk to your health care provider about genetic screening.  If you are at high risk for breast cancer, talk to your health care provider about having an MRI and a mammogram every year.  Breast cancer gene (BRCA) assessment is recommended for women who have family members with BRCA-related cancers. BRCA-related cancers include:  Breast.  Ovarian.  Tubal.  Peritoneal cancers.  Results of the assessment will determine the need for genetic counseling and BRCA1 and BRCA2 testing. Cervical Cancer Your health care provider may recommend that you be screened regularly for cancer of the pelvic organs (ovaries, uterus, and vagina). This screening involves a pelvic examination, including checking for microscopic changes to the surface of your cervix (Pap test). You may be encouraged to have this screening done every 3 years, beginning at age 46.  For women ages 42-65, health care providers may recommend pelvic exams and Pap testing every 3 years, or they may recommend the Pap and pelvic exam, combined with testing for human papilloma virus (HPV), every 5 years. Some types of HPV increase your risk of cervical cancer. Testing for HPV may also be done on women of any age with unclear Pap test results.  Other health care providers may not recommend any screening for nonpregnant women who are considered low risk for pelvic cancer and who do not have symptoms. Ask your health care provider if a screening pelvic exam is right for you.  If you have had past treatment for cervical cancer or a condition that could lead to cancer, you need Pap tests and screening for cancer for at least 20 years  after your treatment. If Pap tests have been discontinued, your risk factors (such as having a new sexual partner) need to be reassessed to determine if screening should resume. Some women have medical problems that increase the chance of getting cervical cancer. In these cases, your health care provider may recommend more frequent screening and Pap tests. Colorectal Cancer  This type of cancer can be detected and often prevented.  Routine colorectal cancer screening usually begins at 70 years of age and continues through 70 years of age.  Your health care provider may recommend screening at an earlier age if you have risk factors for colon cancer.  Your health care provider may also recommend using home test kits to check for hidden blood in the stool.  A small camera at the end of a tube can be used to examine your colon directly (sigmoidoscopy or colonoscopy). This is  done to check for the earliest forms of colorectal cancer.  Routine screening usually begins at age 29.  Direct examination of the colon should be repeated every 5-10 years through 70 years of age. However, you may need to be screened more often if early forms of precancerous polyps or small growths are found. Skin Cancer  Check your skin from head to toe regularly.  Tell your health care provider about any new moles or changes in moles, especially if there is a change in a mole's shape or color.  Also tell your health care provider if you have a mole that is larger than the size of a pencil eraser.  Always use sunscreen. Apply sunscreen liberally and repeatedly throughout the day.  Protect yourself by wearing long sleeves, pants, a wide-brimmed hat, and sunglasses whenever you are outside. HEART DISEASE, DIABETES, AND HIGH BLOOD PRESSURE   High blood pressure causes heart disease and increases the risk of stroke. High blood pressure is more likely to develop in:  People who have blood pressure in the high end of the  normal range (130-139/85-89 mm Hg).  People who are overweight or obese.  People who are African American.  If you are 51-96 years of age, have your blood pressure checked every 3-5 years. If you are 59 years of age or older, have your blood pressure checked every year. You should have your blood pressure measured twice--once when you are at a hospital or clinic, and once when you are not at a hospital or clinic. Record the average of the two measurements. To check your blood pressure when you are not at a hospital or clinic, you can use:  An automated blood pressure machine at a pharmacy.  A home blood pressure monitor.  If you are between 73 years and 27 years old, ask your health care provider if you should take aspirin to prevent strokes.  Have regular diabetes screenings. This involves taking a blood sample to check your fasting blood sugar level.  If you are at a normal weight and have a low risk for diabetes, have this test once every three years after 70 years of age.  If you are overweight and have a high risk for diabetes, consider being tested at a younger age or more often. PREVENTING INFECTION  Hepatitis B  If you have a higher risk for hepatitis B, you should be screened for this virus. You are considered at high risk for hepatitis B if:  You were born in a country where hepatitis B is common. Ask your health care provider which countries are considered high risk.  Your parents were born in a high-risk country, and you have not been immunized against hepatitis B (hepatitis B vaccine).  You have HIV or AIDS.  You use needles to inject street drugs.  You live with someone who has hepatitis B.  You have had sex with someone who has hepatitis B.  You get hemodialysis treatment.  You take certain medicines for conditions, including cancer, organ transplantation, and autoimmune conditions. Hepatitis C  Blood testing is recommended for:  Everyone born from [birth year]  through 1965.  Anyone with known risk factors for hepatitis C. Sexually transmitted infections (STIs)  You should be screened for sexually transmitted infections (STIs) including gonorrhea and chlamydia if:  You are sexually active and are younger than 70 years of age.  You are older than 70 years of age and your health care provider tells you that you are at risk  for this type of infection.  Your sexual activity has changed since you were last screened and you are at an increased risk for chlamydia or gonorrhea. Ask your health care provider if you are at risk.  If you do not have HIV, but are at risk, it may be recommended that you take a prescription medicine daily to prevent HIV infection. This is called pre-exposure prophylaxis (PrEP). You are considered at risk if:  You are sexually active and do not regularly use condoms or know the HIV status of your partner(s).  You take drugs by injection.  You are sexually active with a partner who has HIV. Talk with your health care provider about whether you are at high risk of being infected with HIV. If you choose to begin PrEP, you should first be tested for HIV. You should then be tested every 3 months for as long as you are taking PrEP.  PREGNANCY   If you are premenopausal and you may become pregnant, ask your health care provider about preconception counseling.  If you may become pregnant, take 400 to 800 micrograms (mcg) of folic acid every day.  If you want to prevent pregnancy, talk to your health care provider about birth control (contraception). OSTEOPOROSIS AND MENOPAUSE   Osteoporosis is a disease in which the bones lose minerals and strength with aging. This can result in serious bone fractures. Your risk for osteoporosis can be identified using a bone density scan.  If you are 9 years of age or older, or if you are at risk for osteoporosis and fractures, ask your health care provider if you should be screened.  Ask your  health care provider whether you should take a calcium or vitamin D supplement to lower your risk for osteoporosis.  Menopause may have certain physical symptoms and risks.  Hormone replacement therapy may reduce some of these symptoms and risks. Talk to your health care provider about whether hormone replacement therapy is right for you.  HOME CARE INSTRUCTIONS   Schedule regular health, dental, and eye exams.  Stay current with your immunizations.   Do not use any tobacco products including cigarettes, chewing tobacco, or electronic cigarettes.  If you are pregnant, do not drink alcohol.  If you are breastfeeding, limit how much and how often you drink alcohol.  Limit alcohol intake to no more than 1 drink per day for nonpregnant women. One drink equals 12 ounces of beer, 5 ounces of wine, or 1 ounces of hard liquor.  Do not use street drugs.  Do not share needles.  Ask your health care provider for help if you need support or information about quitting drugs.  Tell your health care provider if you often feel depressed.  Tell your health care provider if you have ever been abused or do not feel safe at home.   This information is not intended to replace advice given to you by your health care provider. Make sure you discuss any questions you have with your health care provider.   Document Released: 01/01/2011 Document Revised: 07/09/2014 Document Reviewed: 05/20/2013 Elsevier Interactive Patient Education [employer].

## 2015-10-12 LAB — FECAL OCCULT BLOOD, IMMUNOCHEMICAL: Fecal Occult Bld: NEGATIVE

## 2015-12-14 DIAGNOSIS — M5137 Other intervertebral disc degeneration, lumbosacral region: Secondary | ICD-10-CM | POA: Diagnosis not present

## 2015-12-14 DIAGNOSIS — M75101 Unspecified rotator cuff tear or rupture of right shoulder, not specified as traumatic: Secondary | ICD-10-CM | POA: Diagnosis not present

## 2015-12-22 ENCOUNTER — Other Ambulatory Visit: Payer: Self-pay | Admitting: Nurse Practitioner

## 2015-12-23 NOTE — Telephone Encounter (Signed)
Last seen 08/24/15  MMM If approved route to nurse to call into [facility]

## 2015-12-29 ENCOUNTER — Other Ambulatory Visit: Payer: Self-pay | Admitting: Family

## 2016-02-13 ENCOUNTER — Other Ambulatory Visit: Payer: Self-pay | Admitting: Nurse Practitioner

## 2016-02-13 DIAGNOSIS — I10 Essential (primary) hypertension: Secondary | ICD-10-CM

## 2016-03-02 ENCOUNTER — Other Ambulatory Visit: Payer: Self-pay | Admitting: Nurse Practitioner

## 2016-03-02 ENCOUNTER — Other Ambulatory Visit: Payer: Self-pay | Admitting: Family

## 2016-03-02 DIAGNOSIS — K219 Gastro-esophageal reflux disease without esophagitis: Secondary | ICD-10-CM

## 2016-03-02 DIAGNOSIS — E785 Hyperlipidemia, unspecified: Secondary | ICD-10-CM

## 2016-03-02 NOTE — Telephone Encounter (Signed)
Last refill without being seen Please call in klonopin with 1 refills

## 2016-03-06 NOTE — Telephone Encounter (Signed)
Last refill without being seen 

## 2016-03-23 ENCOUNTER — Other Ambulatory Visit: Payer: Self-pay | Admitting: Nurse Practitioner

## 2016-03-23 DIAGNOSIS — E039 Hypothyroidism, unspecified: Secondary | ICD-10-CM

## 2016-03-23 NOTE — Telephone Encounter (Signed)
Last refill without being seen 

## 2016-04-13 ENCOUNTER — Other Ambulatory Visit: Payer: Self-pay | Admitting: Nurse Practitioner

## 2016-04-18 ENCOUNTER — Ambulatory Visit (INDEPENDENT_AMBULATORY_CARE_PROVIDER_SITE_OTHER): Payer: PPO | Admitting: Pharmacist

## 2016-04-18 ENCOUNTER — Encounter: Payer: Self-pay | Admitting: Pharmacist

## 2016-04-18 VITALS — BP 128/70 | HR 74 | Ht 63.0 in | Wt 154.0 lb

## 2016-04-18 DIAGNOSIS — M810 Age-related osteoporosis without current pathological fracture: Secondary | ICD-10-CM | POA: Diagnosis not present

## 2016-04-18 DIAGNOSIS — E034 Atrophy of thyroid (acquired): Secondary | ICD-10-CM

## 2016-04-18 DIAGNOSIS — E782 Mixed hyperlipidemia: Secondary | ICD-10-CM | POA: Diagnosis not present

## 2016-04-18 DIAGNOSIS — Z23 Encounter for immunization: Secondary | ICD-10-CM

## 2016-04-18 DIAGNOSIS — I10 Essential (primary) hypertension: Secondary | ICD-10-CM | POA: Diagnosis not present

## 2016-04-18 MED ORDER — CALCIUM CITRATE-VITAMIN D 250-100 MG-UNIT PO TABS: 1.0000 | ORAL_TABLET | Freq: Every day | ORAL | Status: DC

## 2016-04-18 NOTE — Progress Notes (Signed)
Patient ID: Kristen Munoz, female   DOB: Apr 25, 1946, 70 y.o.   MRN: [id number]  [facility]   HPI: Patient with osteoporosis has been getting Prolia 74m SQ q 6 months since 11/2012. She is due to get today.  Back Pain?  Yes       Kyphosis?  No Prior fracture?  No Med(s) for Osteoporosis/Osteopenia:  Prolia 642msq q 6 months Med(s) previously tried for Osteoporosis/Osteopenia:  Calcium - severe constipation,                                         Fosamax - constipation                                        Boniva 15062m month - worsened GERD                                                              PMH: Age at menopause:  Surgical in 1976 Hysterectomy?  Yes Oophorectomy?  No HRT? Yes - Former.  Type/duration: estrogen Steroid Use?  No Thyroid med?  Yes History of cancer?  No  History of digestive disorders (ie Crohn's)?  Yes - GERD, currently taking PPI Current or previous eating disorders?  No Last Vitamin D Result:  Every 3 years ago Last GFR Result:  48 (08/25/2015)   FH/SH: Family history of osteoporosis?  Yes - mother Parent with history of hip fracture?  No Family history of breast cancer?  No Exercise?  No Smoking?  Yes  - has tried gum and patches to quit - successful for a few months but then restarted.  Alcohol?  No    Calcium Assessment Calcium Intake  # of servings/day  Calcium mg  Milk (8 oz) 0  x  300  = 0  Yogurt (4 oz) 2 x  200 = 400m31mheese (1 oz) 1 x  200 = 200mg64mher Calcium sources   250mg 65msupplement 0 = 0   Estimated calcium intake per day 850mg  24mXA Results Date of Test T-Score for AP Spine L1-L4 T-Score for Total Left Hip T-Score for Total Right Hip  02/11/2015 -1.7 -1.6 -1.6  10/29/2012 -1.8 -1.9 -2.0  12/15/2008 -2.5 -1.7 -1.8  10/14/2006 -2.6 -1.9 -2.1  02/24/1998 -2.0 -1.5 --      Assessment: Osteoporosis with improved BMD of spine and both hips since starting Prolia High fracture risk and low calcium  intake - unable to take calcium supplements Hyperlipidemia HTN - controlled  Recommendations: 1.  Continue Prolia 60mg SQ72m months - given today.  Next due 10/17/2016  2.  recommend calcium 1200mg dai42mither through supplementation or diet.   3.  recommend weight bearing exercise - 30 minutes at least 4 days per week.   4.  Counseled and educated about fall risk and prevention. 5.  Influenza vaccine given today 6.  Patient is due mammogram - the one order 10/2015 looks like it was completed but was not - [place] [facility] for time and  date   Orders Placed This Encounter  Procedures  . CMP14+EGFR  . Lipid panel  . Thyroid Panel With TSH  . VITAMIN D 25 Hydroxy (Vit-D Deficiency, Fractures)    Recheck DEXA:  after 02/16/2017  Time spent counseling patient:  30 minutes  Cherre Robins, PharmD, CPP      Patient ID: Kristen Munoz, female   DOB: Feb 02, 1946, 70 y.o.   MRN: [id number]

## 2016-04-18 NOTE — Patient Instructions (Signed)
Calcium & Vitamin D: The Facts  Why is calcium and vitamin D consumption important? Calcium: . Most Americans do not consume adequate amounts of calcium! Calcium is required for proper muscle function, nerve communication, bone support, and many other functions in the body.  . The body uses bones as a source of calcium. Bones 'remodel' themselves continuously - the body constantly breaks bone down to release calcium and rebuilds bones by replacing calcium in the bone later.  . As we get older, the rate of bone breakdown occurs faster than bone rebuilding which could lead to osteopenia, osteoporosis, and possible fractures.   Vitamin D: . People naturally make vitamin D in the body when sunlight hits the skin and triggers a process that leads to vitamin D production. This natural vitamin D production requires about 10-15 minutes of sun exposure on the hands, arms, and face at least 2-3 times per week. However, due to decreased sun exposure and the use of sunscreen, most people will need to get additional vitamin D from foods or supplements. Your doctor can measure your body's vitamin D level through a simple blood test to determine your daily vitamin D needs.  . Vitamin D is used to help the body absorb calcium, maintain bone health, help the immune system, and reduce inflammation. It also plays a role in muscle performance, balance and risk of falling.  . Vitamin D deficiency can lead to osteomalacia or softening of the bones, bone pain, and muscle weakness.   The recommended daily allowance of Calcium and Vitamin D varies for different age groups. Age group Calcium (mg) Vitamin D (IU)  Females and Males: Age 19-50 1000 mg 600 IU  Females: Age 51- 70 1200 mg 600 IU  Males: Age 51-70 1000 mg 600 IU  Females and Males: Age 71+ 1200 mg 800 IU  Pregnant/lactating Females age 19-50 1000 mg 600 IU   How much Calcium do you get in your diet? Calcium Intake # of servings per day  Total calcium (mg)   Skim milk, 2% milk (1 cup) _________ x 300 mg   Yogurt (1 small container) _________ x 200 mg   Cheese (1oz) _________ x 200 mg   Cottage Cheese (1 cup)             ________ x 150 mg   Almond milk (1 cup) _________ x 450 mg   Fortified Orange Juice (1 cup) _________ x 300 mg   Broccoli or spinach ( 1 cup) _________ x 100 mg   Salmon (3 oz) _________ x 150 mg    Almonds (1/4 cup) _______ x 90 mg      How do we get Calcium and Vitamin D in our diet? Calcium: . Obtaining calcium from the diet is the most preferred way to reach the recommended daily goal. If this goal is not reached through diet, calcium supplements are available.  . Calcium is found in many foods including: dairy products, dark leafy vegetables (like broccoli, kale, and spinach), fish, and fortified products like juices and cereals.  . The food label will have a %DV (percent daily value) listed showing the amount of calcium per serving. To determine the total mg per serving, simply replace the % with zero (0).  For example, Almond Breeze almond milk contains 45% DV of calcium or 450mg per 1 cup.  . You can increase the amount of calcium in your diet by using more calcium products in your daily meals. Use yogurt and fruit to   make smoothies or use yogurt to top baked potatoes or make whipped potatoes. Sprinkle low fat cheese onto salads or into egg white omelets. You can even add non-fat dry milk powder (300mg calcium per 1/3 cup) to hot cereals, meat loaf, soups, or potatoes.  . Calcium supplements come in many forms including tablets, chewables, and gummies. Be sure to read the label to determine the correct number of tablets per serving and whether or not to take the supplement with food.  . Calcium carbonate products (Oscal, Caltrate, and Viactiv) are generally better absorbed when taken with food while calcium citrate products like Citracal can be taken with or without food.  . The body can only absorb about 600 mg of calcium  at one time. It is recommended to take calcium supplements in small amounts several times per day.  However, taking it all at once is better than not taking it at all. . Increasing your intake of calcium is essential for bone health, but may also lead to some side effects like constipation, increased gas, bloating or abdominal cramping. To help reduce these side effects, start with 1 tablet per day and slowly increase your intake of the supplement to the recommended doses. It is also recommended that you drink plenty of water each day. Vitamin D: . Very few foods naturally contain vitamin D. However, it is found in saltwater fish (like tuna, salmon and mackerel), beef liver, egg yolks, cheese and vitamin D fortified foods (like yogurt, cereals, orange juice and milk) . The amount of vitamin D in each food or product is listed as %DV on the product label. To determine the total amount of vitamin D per serving, drop the % sign and multiply the number by 4. For example, 1 cup of Almond Breeze almond milk contains 25% DV vitamin D or 100 IU per serving (25 x 4 =100). . Vitamin D is also found in multivitamins and supplements and may be listed as ergocalciferol (vitamin D2) or cholecalciferol (vitamin D3). Each of these forms of vitamin D are equivalent and the daily recommended intake will vary based on your age and the vitamin D levels in your body. Follow your doctor's recommendation for vitamin D intake.       Fall Prevention in the Home  Falls can cause injuries and can affect people from all age groups. There are many simple things that you can do to make your home safe and to help prevent falls. WHAT CAN I DO ON THE OUTSIDE OF MY HOME?  Regularly repair the edges of walkways and driveways and fix any cracks.  Remove high doorway thresholds.  Trim any shrubbery on the main path into your home.  Use bright outdoor lighting.  Clear walkways of debris and clutter, including tools and  rocks.  Regularly check that handrails are securely fastened and in good repair. Both sides of any steps should have handrails.  Install guardrails along the edges of any raised decks or porches.  Have leaves, snow, and ice cleared regularly.  Use sand or salt on walkways during winter months.  In the garage, clean up any spills right away, including grease or oil spills. WHAT CAN I DO IN THE BATHROOM?  Use night lights.  Install grab bars by the toilet and in the tub and shower. Do not use towel bars as grab bars.  Use non-skid mats or decals on the floor of the tub or shower.  If you need to sit down while you are   in the shower, use a plastic, non-slip stool..  Keep the floor dry. Immediately clean up any water that spills on the floor.  Remove soap buildup in the tub or shower on a regular basis.  Attach bath mats securely with double-sided non-slip rug tape.  Remove throw rugs and other tripping hazards from the floor. WHAT CAN I DO IN THE BEDROOM?  Use night lights.  Make sure that a bedside light is easy to reach.  Do not use oversized bedding that drapes onto the floor.  Have a firm chair that has side arms to use for getting dressed.  Remove throw rugs and other tripping hazards from the floor. WHAT CAN I DO IN THE KITCHEN?   Clean up any spills right away.  Avoid walking on wet floors.  Place frequently used items in easy-to-reach places.  If you need to reach for something above you, use a sturdy step stool that has a grab bar.  Keep electrical cables out of the way.  Do not use floor polish or wax that makes floors slippery. If you have to use wax, make sure that it is non-skid floor wax.  Remove throw rugs and other tripping hazards from the floor. WHAT CAN I DO IN THE STAIRWAYS?  Do not leave any items on the stairs.  Make sure that there are handrails on both sides of the stairs. Fix handrails that are broken or loose. Make sure that handrails  are as long as the stairways.  Check any carpeting to make sure that it is firmly attached to the stairs. Fix any carpet that is loose or worn.  Avoid having throw rugs at the top or bottom of stairways, or secure the rugs with carpet tape to prevent them from moving.  Make sure that you have a light switch at the top of the stairs and the bottom of the stairs. If you do not have them, have them installed. WHAT ARE SOME OTHER FALL PREVENTION TIPS?  Wear closed-toe shoes that fit well and support your feet. Wear shoes that have rubber soles or low heels.  When you use a stepladder, make sure that it is completely opened and that the sides are firmly locked. Have someone hold the ladder while you are using it. Do not climb a closed stepladder.  Add color or contrast paint or tape to grab bars and handrails in your home. Place contrasting color strips on the first and last steps.  Use mobility aids as needed, such as canes, walkers, scooters, and crutches.  Turn on lights if it is dark. Replace any light bulbs that burn out.  Set up furniture so that there are clear paths. Keep the furniture in the same spot.  Fix any uneven floor surfaces.  Choose a carpet design that does not hide the edge of steps of a stairway.  Be aware of any and all pets.  Review your medicines with your healthcare provider. Some medicines can cause dizziness or changes in blood pressure, which increase your risk of falling. Talk with your health care provider about other ways that you can decrease your risk of falls. This may include working with a physical therapist or trainer to improve your strength, balance, and endurance.   This information is not intended to replace advice given to you by your health care provider. Make sure you discuss any questions you have with your health care provider.   Document Released: 06/08/2002 Document Revised: 11/02/2014 Document Reviewed: 07/23/2014 Elsevier Interactive    Patient Education 2016 Elsevier Inc.  

## 2016-04-19 LAB — LIPID PANEL
Chol/HDL Ratio: 2.9 ratio units (ref 0.0–4.4)
Cholesterol, Total: 155 mg/dL (ref 100–199)
HDL: 54 mg/dL (ref >39)
LDL Calculated: 78 mg/dL (ref 0–99)
Triglycerides: 115 mg/dL (ref 0–149)
VLDL CHOLESTEROL CAL: 23 mg/dL (ref 5–40)

## 2016-04-19 LAB — CMP14+EGFR
A/G RATIO: 1.7 (ref 1.2–2.2)
ALBUMIN: 4.5 g/dL (ref 3.5–4.8)
ALT: 11 IU/L (ref 0–32)
AST: 10 IU/L (ref 0–40)
Alkaline Phosphatase: 85 IU/L (ref 39–117)
BILIRUBIN TOTAL: 0.3 mg/dL (ref 0.0–1.2)
BUN / CREAT RATIO: 17 (ref 12–28)
BUN: 20 mg/dL (ref 8–27)
CHLORIDE: 94 mmol/L — AB (ref 96–106)
CO2: 25 mmol/L (ref 18–29)
Calcium: 9.5 mg/dL (ref 8.7–10.3)
Creatinine, Ser: 1.19 mg/dL — ABNORMAL HIGH (ref 0.57–1.00)
GFR calc non Af Amer: 46 mL/min/{1.73_m2} — ABNORMAL LOW (ref >59)
GFR, EST AFRICAN AMERICAN: 53 mL/min/{1.73_m2} — AB (ref >59)
GLOBULIN, TOTAL: 2.7 g/dL (ref 1.5–4.5)
Glucose: 102 mg/dL — ABNORMAL HIGH (ref 65–99)
POTASSIUM: 4.1 mmol/L (ref 3.5–5.2)
Sodium: 137 mmol/L (ref 134–144)
TOTAL PROTEIN: 7.2 g/dL (ref 6.0–8.5)

## 2016-04-19 LAB — VITAMIN D 25 HYDROXY (VIT D DEFICIENCY, FRACTURES): VIT D 25 HYDROXY: 15.5 ng/mL — AB (ref 30.0–100.0)

## 2016-04-19 LAB — THYROID PANEL WITH TSH
Free Thyroxine Index: 2.8 (ref 1.2–4.9)
T3 Uptake Ratio: 29 % (ref 24–39)
T4 TOTAL: 9.8 ug/dL (ref 4.5–12.0)
TSH: 0.554 u[IU]/mL (ref 0.450–4.500)

## 2016-04-19 MED ORDER — DENOSUMAB 60 MG/ML ~~LOC~~ SOLN
60.0000 mg | Freq: Once | SUBCUTANEOUS | Status: AC
  Administered 2016-04-18: 60 mg via SUBCUTANEOUS

## 2016-04-19 NOTE — Addendum Note (Signed)
Addended by: Henrene PastorECKARD, Jayceon Troy on: 04/19/2016 09:25 AM   Modules accepted: Orders

## 2016-05-07 DIAGNOSIS — Z1231 Encounter for screening mammogram for malignant neoplasm of breast: Secondary | ICD-10-CM | POA: Diagnosis not present

## 2016-05-11 ENCOUNTER — Other Ambulatory Visit: Payer: Self-pay | Admitting: Nurse Practitioner

## 2016-05-14 NOTE — Telephone Encounter (Signed)
Last refill without being seen 

## 2016-06-07 ENCOUNTER — Other Ambulatory Visit: Payer: Self-pay | Admitting: Nurse Practitioner

## 2016-06-07 DIAGNOSIS — K219 Gastro-esophageal reflux disease without esophagitis: Secondary | ICD-10-CM

## 2016-06-07 DIAGNOSIS — E785 Hyperlipidemia, unspecified: Secondary | ICD-10-CM

## 2016-06-15 ENCOUNTER — Other Ambulatory Visit: Payer: Self-pay | Admitting: Nurse Practitioner

## 2016-06-15 DIAGNOSIS — I10 Essential (primary) hypertension: Secondary | ICD-10-CM

## 2016-06-18 NOTE — Telephone Encounter (Signed)
Pt aware NTBS before next refill 

## 2016-06-18 NOTE — Telephone Encounter (Signed)
Last refill without being seen 

## 2016-06-28 ENCOUNTER — Other Ambulatory Visit: Payer: Self-pay | Admitting: Nurse Practitioner

## 2016-06-28 DIAGNOSIS — E039 Hypothyroidism, unspecified: Secondary | ICD-10-CM

## 2016-07-20 ENCOUNTER — Other Ambulatory Visit: Payer: Self-pay | Admitting: Nurse Practitioner

## 2016-07-23 NOTE — Telephone Encounter (Signed)
Please call in clonazepam with 1 refills 

## 2016-07-23 NOTE — Telephone Encounter (Signed)
Needs to be seen- See previous  Note about refill

## 2016-08-28 ENCOUNTER — Telehealth: Payer: Self-pay | Admitting: *Deleted

## 2016-08-29 NOTE — Telephone Encounter (Signed)
Have patient come in for a visit to discuss other options and risks and benefits of each

## 2016-08-29 NOTE — Telephone Encounter (Signed)
Left message for patient to call and schedule an appointment with her provider to discuss a new medication with benefits and riskd.

## 2016-09-13 ENCOUNTER — Other Ambulatory Visit: Payer: Self-pay | Admitting: Nurse Practitioner

## 2016-09-13 DIAGNOSIS — E785 Hyperlipidemia, unspecified: Secondary | ICD-10-CM

## 2016-09-13 DIAGNOSIS — K219 Gastro-esophageal reflux disease without esophagitis: Secondary | ICD-10-CM

## 2016-09-22 ENCOUNTER — Other Ambulatory Visit: Payer: Self-pay | Admitting: Nurse Practitioner

## 2016-09-22 DIAGNOSIS — I10 Essential (primary) hypertension: Secondary | ICD-10-CM

## 2016-09-24 ENCOUNTER — Other Ambulatory Visit: Payer: Self-pay | Admitting: *Deleted

## 2016-09-24 DIAGNOSIS — I10 Essential (primary) hypertension: Secondary | ICD-10-CM

## 2016-09-24 MED ORDER — LISINOPRIL-HYDROCHLOROTHIAZIDE 10-12.5 MG PO TABS: 1.0000 | ORAL_TABLET | Freq: Every day | ORAL | 0 refills | Status: DC

## 2016-10-10 ENCOUNTER — Encounter: Payer: Self-pay | Admitting: Pharmacist

## 2016-10-10 ENCOUNTER — Ambulatory Visit (INDEPENDENT_AMBULATORY_CARE_PROVIDER_SITE_OTHER): Payer: PPO | Admitting: Pharmacist

## 2016-10-10 VITALS — BP 128/72 | HR 72 | Ht 63.25 in | Wt 155.5 lb

## 2016-10-10 DIAGNOSIS — F17210 Nicotine dependence, cigarettes, uncomplicated: Secondary | ICD-10-CM

## 2016-10-10 DIAGNOSIS — E7800 Pure hypercholesterolemia, unspecified: Secondary | ICD-10-CM | POA: Diagnosis not present

## 2016-10-10 DIAGNOSIS — E039 Hypothyroidism, unspecified: Secondary | ICD-10-CM | POA: Diagnosis not present

## 2016-10-10 DIAGNOSIS — R7989 Other specified abnormal findings of blood chemistry: Secondary | ICD-10-CM

## 2016-10-10 DIAGNOSIS — Z0000 Encounter for general adult medical examination without abnormal findings: Secondary | ICD-10-CM | POA: Diagnosis not present

## 2016-10-10 DIAGNOSIS — Z1239 Encounter for other screening for malignant neoplasm of breast: Secondary | ICD-10-CM

## 2016-10-10 DIAGNOSIS — F17200 Nicotine dependence, unspecified, uncomplicated: Secondary | ICD-10-CM

## 2016-10-10 NOTE — Progress Notes (Signed)
Patient ID: Kristen Munoz, female   DOB: 04-Nov-1945, 71 y.o.   MRN: [id number]     Subjective:   Kristen Munoz is a 71 y.o. female who presents for a subsequent Medicare Annual Wellness Visit.  Kristen Munoz is married and lives with her husband and daughter in [place], [place].  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   She has a total of 3 adult daughters who like in the [place] area.   Kristen Munoz does not get out much except in the evening / night when her daughter gets home from work and can care for her husband.  Kristen Munoz enjoys watching TV with her husband and connects with distant family through social media.   She has 2 dogs at home.    Review of Systems  Review of Systems  Cardiovascular: Positive for chest pain (intermittant.  reports it usually occurs when she is frustrated with husband / stressed).  Genitourinary: Positive for frequency (especially at night).  Psychiatric/Behavioral: Positive for depression. The patient is nervous/anxious.     .  Current Medications (verified) Reports she is taking all medications in the evening  Outpatient Encounter Prescriptions as of 10/10/2016  Medication Sig  . cholecalciferol (VITAMIN D) 1000 units tablet Take 1,000 Units by mouth daily.  . clonazePAM (KLONOPIN) 0.5 MG tablet TAKE ONE TABLET BY MOUTH TWICE DAILY AS NEEDED FOR ANXIETY (mostly taking just once daily at bedtime to help sleep)  . denosumab (PROLIA) 60 MG/ML SOLN injection Inject 60 mg into the skin every 6 (six) months. Administer in upper arm, thigh, or abdomen  . escitalopram (LEXAPRO) 20 MG tablet TAKE ONE TABLET BY MOUTH ONCE DAILY  . levothyroxine (SYNTHROID, LEVOTHROID) 25 MCG tablet TAKE ONE TABLET BY MOUTH ONCE DAILY BEFORE BREAKFAST  . lisinopril-hydrochlorothiazide (PRINZIDE,ZESTORETIC) 10-12.5 MG tablet Take 1 tablet by mouth daily.   . pantoprazole (PROTONIX) 40 MG  tablet TAKE ONE TABLET BY MOUTH ONCE DAILY  . simvastatin (ZOCOR) 40 MG tablet TAKE ONE TABLET BY MOUTH ONCE DAILY  . aspirin EC 81 MG tablet Take 81 mg by mouth daily.   No facility-administered encounter medications on file as of 10/10/2016.     Allergies (verified) Sulfa antibiotics   History: Past Medical History:  Diagnosis Date  . CAD (coronary artery disease)   . GERD (gastroesophageal reflux disease)   . Hyperlipidemia   . Hypertension   . Insomnia   . Osteoporosis   . Thyroid disease   . Urinary bladder incontinence    Past Surgical History:  Procedure Laterality Date  . ABDOMINAL HYSTERECTOMY     partial  . bladder      baldder tack  . THYROID SURGERY    . VESICOVAGINAL FISTULA CLOSURE W/ TAH     Family History  Problem Relation Age of Onset  . Cancer Mother     colon  . Hypertension Mother   . Diabetes Mother   . Heart disease Mother   . Cancer Father     lung  . Diabetes Father   . Hypertension Father   . Heart disease Father   . Cancer Sister     bone  . Heart failure Sister   . Cancer Brother     kidney  . Early death Brother     gun shot  . Birth defects Brother   . Stevens-Johnson syndrome Sister   . Anxiety  disorder Sister   . Depression Sister   . Drug abuse Paternal Uncle    Social History   Occupational History  . Not on file.   Social History Main Topics  . Smoking status: Current Every Day Smoker    Packs/day: 1.00    Years: 38.00    Types: Cigarettes    Start date: 07/02/1974  . Smokeless tobacco: Never Used  . Alcohol use No  . Drug use: No  . Sexual activity: No     Dietary issues and exercise activities: Current Exercise Habits: The patient has a physically strenous job, but has no regular exercise apart from work.  Current Dietary habits:  Patient only eating once a day most days.  Some snack of cheese and crackers.  Evening meals are usually take out - [place] and [place].    Objective:    Today's Vitals    10/10/16 1607  BP: 128/72  Pulse: 72  Weight: 155 lb 8 oz (70.5 kg)  Height: 5' 3.25" (1.607 m)  PainSc: 0-No pain   Body mass index is 27.33 kg/m.  Activities of Daily Living In your present state of health, do you have any difficulty performing the following activities: 10/10/2016  Hearing? N  Vision? N  Difficulty concentrating or making decisions? N  Walking or climbing stairs? Y  Dressing or bathing? N  Doing errands, shopping? N  Preparing Food and eating ? N  Using the Toilet? N  In the past six months, have you accidently leaked urine? Y  Do you have problems with loss of bowel control? N  Managing your Medications? N  Managing your Finances? N  Housekeeping or managing your Housekeeping? N  Some recent data might be hidden     Cardiac Risk Factors include: advanced age (>25mn, >>54women);dyslipidemia;family history of premature cardiovascular disease;hypertension;sedentary lifestyle;smoking/ tobacco exposure  Depression Screen PHQ 2/9 Scores 10/10/2016 04/18/2016 10/07/2015 08/25/2015  PHQ - 2 Score '6 3 2 ' 0  PHQ- 9 Score '12 9 3 ' -     Fall Risk Fall Risk  10/10/2016 04/18/2016 10/07/2015 08/25/2015 10/11/2014  Falls in the past year? No No No No No  Number falls in past yr: - - - - -  Injury with Fall? - - - - -  Follow up - - - - -    Cognitive Function: MMSE - Mini Mental State Exam 10/10/2016 10/10/2016 10/07/2015 09/17/2014  Orientation to time '5 5 5 5  ' Orientation to Place '5 5 5 5  ' Registration 3 - 3 3  Attention/ Calculation '4 4 3 5  ' Recall 2 - 3 3  Language- name 2 objects '2 2 2 2  ' Language- repeat '1 1 1 1  ' Language- follow 3 step command '3 3 3 3  ' Language- read & follow direction '1 1 1 1  ' Write a sentence '1 1 1 1  ' Copy design 0 0 1 1  Total score 27 - 28 30    Immunizations and Health Maintenance Immunization History  Administered Date(s) Administered  . Influenza, High Dose Seasonal PF 04/18/2016  . Influenza,inj,Quad PF,36+ Mos 04/22/2013,  06/05/2014, 03/31/2015  . Pneumococcal Conjugate-13 10/11/2014  . Pneumococcal Polysaccharide-23 05/03/2011  . Zoster 09/17/2014   Health Maintenance Due  Topic Date Due  . COLON CANCER SCREENING ANNUAL FOBT  10/06/2016    Patient Care Team: MChevis Pretty FNP as PCP - General (Nurse Practitioner) YAdelina MingsHMargret Chance MD as Referring Physician (Optometry)  Indicate any recent Medical Services you may  have received from other than [facility] providers in the past year (date may be approximate).    Assessment:    Annual Wellness Visit  Intermittent chest pain - sounds like it is related to stress Depression / anxiety - depression score has increased compared to last year.  Related to stress in caring for husband.   Screening Tests Health Maintenance  Topic Date Due  . COLON CANCER SCREENING ANNUAL FOBT  10/06/2016  . INFLUENZA VACCINE  01/30/2017  . DEXA SCAN  02/10/2017  . MAMMOGRAM  05/07/2018  . TETANUS/TDAP  04/11/2019  . Hepatitis C Screening  Completed  . PNA vac Low Risk Adult  Completed        Plan:   During the course of the visit Kristen Munoz was educated and counseled about the following appropriate screening and preventive services:   Vaccines to include Pneumoccal, Influenza, Td and Shingles - UTD  Appt made for patient to see PCP tomorrow for anxiety/CP/Depression  Vaccines are UTD  Colorectal cancer screening - UTD  Cardiovascular disease screening - needed  Diabetes screening - checking [facility]BG today  Bone Denisty / Osteoporosis Screening - UTD, next due 04/2017  Patient is due Prolia but we are waiting in [place] from [place] - last 10/07/2015, order sent  Nutrition counseling - discussed getting more vegetables and fruits.  Increase whole grains and eating 3 small meals daily  Smoking cessation counseling - discussed but patient declined at this time  Lung CT order / lung cancer screening - pt requested to get at [facility] since closer to her  home.  Physical Activity - increase exercise, goal is 150 minutes per week  Change lisinopril HCTZ and levothyroxine to am.    Orders Placed This Encounter  Procedures  . MM Digital Screening    Scheduling Instructions:     [facility], [place]    Order Specific Question:   Reason for Exam (SYMPTOM  OR DIAGNOSIS REQUIRED)    Answer:   screening    Order Specific Question:   Preferred imaging location?    Answer:   External  . CT CHEST LUNG CA SCREEN LOW DOSE W/O CM    Scheduling Instructions:     Patient requests to have at [facility] if possible    Order Specific Question:   Reason for Exam (SYMPTOM  OR DIAGNOSIS REQUIRED)    Answer:   greater than 30 pack year smoking history  . Lipid panel  . Thyroid Panel With TSH  . VITAMIN D 25 Hydroxy (Vit-D Deficiency, Fractures)  . CMP14+EGFR  . CBC with Differential/Platelet    Patient Instructions (the written plan) were given to the patient.   Cherre Robins, PharmD   10/10/2016

## 2016-10-10 NOTE — Patient Instructions (Addendum)
  Ms. Maietta , Thank you for taking time to come for your Medicare Wellness Visit. I appreciate your ongoing commitment to your health goals. Please review the following plan we discussed and let me know if I can assist you in the future.   These are the goals we discussed:  Try to have 3 small meals a day or snack.  Increase fruits and vegetables.   Increase exercise - goal is 150 minutes a week.   Change lisinopril HCTZ and levothyroxine to taking in morning.  Ok to take other medications in the evening.   Let us know when you are ready to quit smoking - we can recommend medications that might help.  I am sending referrals for mammogram to [facility] and CT scan of the lung (lung cancer screening) to [facility]    This is a list of the screening recommended for you and due dates:  Health Maintenance  Topic Date Due  . Flu Shot  01/30/2017  . DEXA scan (bone density measurement)  02/10/2017  . Mammogram  05/07/2018  . Tetanus Vaccine  04/11/2019  .  Hepatitis C: One time screening is recommended by Center for Disease Control  (CDC) for  adults born from [birth year] through 1965.   Completed  . Pneumonia vaccines  Completed

## 2016-10-11 ENCOUNTER — Ambulatory Visit (INDEPENDENT_AMBULATORY_CARE_PROVIDER_SITE_OTHER): Payer: PPO

## 2016-10-11 ENCOUNTER — Ambulatory Visit (INDEPENDENT_AMBULATORY_CARE_PROVIDER_SITE_OTHER): Payer: PPO | Admitting: Nurse Practitioner

## 2016-10-11 ENCOUNTER — Encounter: Payer: Self-pay | Admitting: Nurse Practitioner

## 2016-10-11 VITALS — BP 118/71 | HR 72 | Temp 97.4°F | Ht 63.0 in | Wt 156.0 lb

## 2016-10-11 DIAGNOSIS — R1031 Right lower quadrant pain: Secondary | ICD-10-CM

## 2016-10-11 DIAGNOSIS — F419 Anxiety disorder, unspecified: Secondary | ICD-10-CM

## 2016-10-11 DIAGNOSIS — K219 Gastro-esophageal reflux disease without esophagitis: Secondary | ICD-10-CM | POA: Diagnosis not present

## 2016-10-11 DIAGNOSIS — I10 Essential (primary) hypertension: Secondary | ICD-10-CM | POA: Diagnosis not present

## 2016-10-11 DIAGNOSIS — E782 Mixed hyperlipidemia: Secondary | ICD-10-CM

## 2016-10-11 DIAGNOSIS — R079 Chest pain, unspecified: Secondary | ICD-10-CM

## 2016-10-11 DIAGNOSIS — D72829 Elevated white blood cell count, unspecified: Secondary | ICD-10-CM

## 2016-10-11 LAB — THYROID PANEL WITH TSH
Free Thyroxine Index: 2.2 (ref 1.2–4.9)
T3 UPTAKE RATIO: 25 % (ref 24–39)
T4 TOTAL: 8.9 ug/dL (ref 4.5–12.0)
TSH: 0.895 u[IU]/mL (ref 0.450–4.500)

## 2016-10-11 LAB — CBC WITH DIFFERENTIAL/PLATELET
BASOS ABS: 0 10*3/uL (ref 0.0–0.2)
Basos: 0 %
EOS (ABSOLUTE): 0.1 10*3/uL (ref 0.0–0.4)
Eos: 1 %
Hematocrit: 34.7 % (ref 34.0–46.6)
Hemoglobin: 11.9 g/dL (ref 11.1–15.9)
Immature Grans (Abs): 0 10*3/uL (ref 0.0–0.1)
Immature Granulocytes: 0 %
Lymphocytes Absolute: 3.1 10*3/uL (ref 0.7–3.1)
Lymphs: 27 %
MCH: 30.1 pg (ref 26.6–33.0)
MCHC: 34.3 g/dL (ref 31.5–35.7)
MCV: 88 fL (ref 79–97)
MONOS ABS: 0.7 10*3/uL (ref 0.1–0.9)
Monocytes: 6 %
Neutrophils Absolute: 7.8 10*3/uL — ABNORMAL HIGH (ref 1.4–7.0)
Neutrophils: 66 %
Platelets: 249 10*3/uL (ref 150–379)
RBC: 3.96 x10E6/uL (ref 3.77–5.28)
RDW: 13.8 % (ref 12.3–15.4)
WBC: 11.8 10*3/uL — AB (ref 3.4–10.8)

## 2016-10-11 LAB — CMP14+EGFR
A/G RATIO: 1.6 (ref 1.2–2.2)
ALT: 9 IU/L (ref 0–32)
AST: 11 IU/L (ref 0–40)
Albumin: 4.5 g/dL (ref 3.5–4.8)
Alkaline Phosphatase: 71 IU/L (ref 39–117)
BILIRUBIN TOTAL: 0.2 mg/dL (ref 0.0–1.2)
BUN / CREAT RATIO: 18 (ref 12–28)
BUN: 21 mg/dL (ref 8–27)
CO2: 27 mmol/L (ref 18–29)
Calcium: 9.5 mg/dL (ref 8.7–10.3)
Chloride: 99 mmol/L (ref 96–106)
Creatinine, Ser: 1.2 mg/dL — ABNORMAL HIGH (ref 0.57–1.00)
GFR calc non Af Amer: 46 mL/min/{1.73_m2} — ABNORMAL LOW (ref >59)
GFR, EST AFRICAN AMERICAN: 53 mL/min/{1.73_m2} — AB (ref >59)
GLOBULIN, TOTAL: 2.8 g/dL (ref 1.5–4.5)
GLUCOSE: 82 mg/dL (ref 65–99)
POTASSIUM: 4.1 mmol/L (ref 3.5–5.2)
SODIUM: 141 mmol/L (ref 134–144)
Total Protein: 7.3 g/dL (ref 6.0–8.5)

## 2016-10-11 LAB — VITAMIN D 25 HYDROXY (VIT D DEFICIENCY, FRACTURES): Vit D, 25-Hydroxy: 19.3 ng/mL — ABNORMAL LOW (ref 30.0–100.0)

## 2016-10-11 LAB — LIPID PANEL
CHOL/HDL RATIO: 3.3 ratio (ref 0.0–4.4)
CHOLESTEROL TOTAL: 150 mg/dL (ref 100–199)
HDL: 46 mg/dL (ref >39)
LDL CALC: 51 mg/dL (ref 0–99)
Triglycerides: 266 mg/dL — ABNORMAL HIGH (ref 0–149)
VLDL Cholesterol Cal: 53 mg/dL — ABNORMAL HIGH (ref 5–40)

## 2016-10-11 MED ORDER — PANTOPRAZOLE SODIUM 40 MG PO TBEC
40.0000 mg | DELAYED_RELEASE_TABLET | Freq: Every day | ORAL | 1 refills | Status: DC
Start: 2016-10-11 — End: 2017-05-20

## 2016-10-11 MED ORDER — SIMVASTATIN 40 MG PO TABS: 40.0000 mg | ORAL_TABLET | Freq: Every day | ORAL | 1 refills | Status: DC

## 2016-10-11 MED ORDER — ESCITALOPRAM OXALATE 20 MG PO TABS: 20.0000 mg | ORAL_TABLET | Freq: Every day | ORAL | 1 refills | Status: DC

## 2016-10-11 MED ORDER — LISINOPRIL-HYDROCHLOROTHIAZIDE 10-12.5 MG PO TABS: 1.0000 | ORAL_TABLET | Freq: Every day | ORAL | 0 refills | Status: DC

## 2016-10-11 NOTE — Progress Notes (Signed)
Subjective:    Patient ID: Kristen Munoz, female    DOB: Nov 10, 1945, 71 y.o.   MRN: [id number]  HPI  Patient come sin today with several complaints:-  - anxiety- has always had problems with anxiety and is on klonopin BID- she does not take everyday- only takes when she needs it but thinks anxiety has worsened. 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 GAD 7 : Generalized Anxiety Score 10/11/2016  Nervous, Anxious, on Edge 2  Control/stop worrying 2  Worry too much - different things 1  Trouble relaxing 1  Restless 1  Easily annoyed or irritable 2  Afraid - awful might happen 1  Total GAD 7 Score 10  Anxiety Difficulty Very difficult   - Chest pain- has been intermittent for 2 days- pressure type of pain- denies SOB- pain is intermittent mainly when she is upset. Usually resolves on its own.  - Right lower quadrant pain- Started last week 8/10 but is now 4/10. No diarrhea or constipation- no fever- urine has odor. Denies rash.   Review of Systems  Constitutional: Negative for diaphoresis and fatigue.  HENT: Negative.   Respiratory: Negative for shortness of breath.   Cardiovascular: Positive for chest pain. Negative for palpitations and leg swelling.  Gastrointestinal: Positive for abdominal pain. Negative for constipation, diarrhea and rectal pain.  Genitourinary: Positive for frequency. Negative for dysuria and urgency.  Neurological: Negative.   Psychiatric/Behavioral: Negative.   All other systems reviewed and are negative.      Objective:   Physical Exam  Constitutional: She appears well-developed and well-nourished. No distress.  HENT:  Right Ear: External ear normal.  Left Ear: External ear normal.  Nose: Nose normal.  Mouth/Throat: Oropharynx is clear and moist.  Cardiovascular: Normal rate and regular rhythm.   Pulmonary/Chest: Effort normal and breath sounds normal.  Abdominal: There is tenderness (right lower quadrant).  Psychiatric: She  has a normal mood and affect. Her behavior is normal. Judgment and thought content normal.      BP 118/71   Pulse 72   Temp 97.4 F (36.3 C) (Oral)   Ht  (1.6 m)   Wt 156 lb (70.8 kg)   BMI 27.63 kg/m   EKG- NSR-Mary-Margaret Lessly Stigler, FNP KUB- mld stool burden- otherwise normal-Preliminary reading by Paulene Floor, FNP  [facility] WBC 11.4    Assessment & Plan:  1. Acute right lower quadrant pain Bland diet Will call with CT schedule in AM- NPO after midnight If worsens go to ER - DG Abd 1 View; Future - CT Abdomen Pelvis Wo Contrast; Future  2. Chest pain, unspecified type Referral to cardiology - EKG 12-Lead - Ambulatory referral to Cardiology  3. Anxiety - no change in meds right now Stress management  Refill of chronic meds Meds ordered this encounter  Medications  . lisinopril-hydrochlorothiazide (PRINZIDE,ZESTORETIC) 10-12.5 MG tablet    Sig: Take 1 tablet by mouth daily.    Dispense:  90 tablet    Refill:  0    Order Specific Question:   Supervising Provider    Answer:   VINCENT, CAROL L [[id number]]  . pantoprazole (PROTONIX) 40 MG tablet    Sig: Take 1 tablet (40 mg total) by mouth daily.    Dispense:  90 tablet    Refill:  1    Order Specific Question:   Supervising Provider    Answer:   VINCENT, CAROL L [[id number]]  . simvastatin (ZOCOR) 40 MG tablet  Sig: Take 1 tablet (40 mg total) by mouth daily.    Dispense:  90 tablet    Refill:  1    Order Specific Question:   Supervising Provider    Answer:   VINCENT, CAROL L [[id number]]  . escitalopram (LEXAPRO) 20 MG tablet    Sig: Take 1 tablet (20 mg total) by mouth daily.    Dispense:  90 tablet    Refill:  1    Order Specific Question:   Supervising Provider    Answer:   Johna Sheriff [[id number]]    Mary-Margaret Daphine Deutscher, FNP

## 2016-10-12 ENCOUNTER — Ambulatory Visit (HOSPITAL_COMMUNITY)
Admission: RE | Admit: 2016-10-12 | Discharge: 2016-10-12 | Disposition: A | Discharge status: Home / Self Care | Payer: PPO | Source: Ambulatory Visit | Attending: Nurse Practitioner | Admitting: Nurse Practitioner

## 2016-10-12 DIAGNOSIS — J984 Other disorders of lung: Secondary | ICD-10-CM | POA: Insufficient documentation

## 2016-10-12 DIAGNOSIS — R1031 Right lower quadrant pain: Secondary | ICD-10-CM | POA: Diagnosis not present

## 2016-10-12 DIAGNOSIS — I700 Atherosclerosis of aorta: Secondary | ICD-10-CM | POA: Diagnosis not present

## 2016-10-16 ENCOUNTER — Telehealth: Payer: Self-pay | Admitting: Nurse Practitioner

## 2016-10-16 ENCOUNTER — Other Ambulatory Visit: Payer: Self-pay | Admitting: Pharmacist

## 2016-10-16 MED ORDER — VITAMIN D (ERGOCALCIFEROL) 1.25 MG (50000 UNIT) PO CAPS: 50000.0000 [IU] | ORAL_CAPSULE | ORAL | 0 refills | Status: DC

## 2016-10-16 NOTE — Telephone Encounter (Signed)
Patient notified of labs and recommendations

## 2016-10-16 NOTE — Addendum Note (Signed)
Addended by: Henrene Pastor B on: 10/16/2016 04:45 PM   Modules accepted: Orders

## 2016-11-05 ENCOUNTER — Telehealth: Payer: Self-pay | Admitting: Cardiology

## 2016-11-05 ENCOUNTER — Encounter: Payer: Self-pay | Admitting: *Deleted

## 2016-11-05 ENCOUNTER — Encounter: Payer: Self-pay | Admitting: Cardiology

## 2016-11-05 ENCOUNTER — Ambulatory Visit (INDEPENDENT_AMBULATORY_CARE_PROVIDER_SITE_OTHER): Payer: PPO | Admitting: Cardiology

## 2016-11-05 VITALS — BP 112/67 | HR 76 | Ht 63.5 in | Wt 154.2 lb

## 2016-11-05 DIAGNOSIS — R079 Chest pain, unspecified: Secondary | ICD-10-CM

## 2016-11-05 NOTE — Telephone Encounter (Signed)
Stress test schedule at [facility] Nov 14, 2016

## 2016-11-05 NOTE — Progress Notes (Signed)
Clinical Summary Kristen Munoz is a 71 y.o.female seen as new patient. Referred by NP Daphine DeutscherMartin for chest pain.   1. Chest pain - cath 2006 mild to moderate nonobstructive CAD - CT A/P with aortiliac atherosclerosis.   - started about 4 months. Tightness in chest midchest, 6/10 in severity. Mainly occurs with activity. +SOB, + diaphoretic, nausea. Pain last just a few minutes. Occurs about twice a week. Worst position.  - significant symptoms with mopping/sweeping floors - increase in frequency, stable in severity. - increased fatigue. Occasionally related to eating   CAD risk factors: +tobacco, HTN, HL, 2 sisters with CHF, father with CHF.     2. Anxiety - caregiver for her husband who has multiple medical issues, source of significant anxiety.     Past Medical History:  Diagnosis Date  . CAD (coronary artery disease)   . GERD (gastroesophageal reflux disease)   . Hyperlipidemia   . Hypertension   . Insomnia   . Osteoporosis   . Thyroid disease   . Urinary bladder incontinence      Allergies  Allergen Reactions  . Sulfa Antibiotics Nausea Only     Current Outpatient Prescriptions  Medication Sig Dispense Refill  . aspirin EC 81 MG tablet Take 81 mg by mouth daily.    . clonazePAM (KLONOPIN) 0.5 MG tablet TAKE ONE TABLET BY MOUTH TWICE DAILY AS NEEDED FOR ANXIETY 60 tablet 0  . denosumab (PROLIA) 60 MG/ML SOLN injection Inject 60 mg into the skin every 6 (six) months. Administer in upper arm, thigh, or abdomen    . escitalopram (LEXAPRO) 20 MG tablet Take 1 tablet (20 mg total) by mouth daily. 90 tablet 1  . levothyroxine (SYNTHROID, LEVOTHROID) 25 MCG tablet TAKE ONE TABLET BY MOUTH ONCE DAILY BEFORE BREAKFAST 90 tablet 3  . lisinopril-hydrochlorothiazide (PRINZIDE,ZESTORETIC) 10-12.5 MG tablet Take 1 tablet by mouth daily. 90 tablet 0  . pantoprazole (PROTONIX) 40 MG tablet Take 1 tablet (40 mg total) by mouth daily. 90 tablet 1  . simvastatin (ZOCOR) 40 MG tablet  Take 1 tablet (40 mg total) by mouth daily. 90 tablet 1  . Vitamin D, Ergocalciferol, (DRISDOL) 50000 units CAPS capsule Take 1 capsule (50,000 Units total) by mouth every 7 (seven) days. 12 capsule 0   No current facility-administered medications for this visit.      Past Surgical History:  Procedure Laterality Date  . ABDOMINAL HYSTERECTOMY     partial  . bladder      baldder tack  . THYROID SURGERY    . VESICOVAGINAL FISTULA CLOSURE W/ TAH       Allergies  Allergen Reactions  . Sulfa Antibiotics Nausea Only      Family History  Problem Relation Age of Onset  . Cancer Mother     colon  . Hypertension Mother   . Diabetes Mother   . Heart disease Mother   . Cancer Father     lung  . Diabetes Father   . Hypertension Father   . Heart disease Father   . Cancer Sister     bone  . Heart failure Sister   . Cancer Brother     kidney  . Early death Brother     gun shot  . Birth defects Brother   . Stevens-Johnson syndrome Sister   . Anxiety disorder Sister   . Depression Sister   . Drug abuse Paternal Uncle      Social History Kristen Munoz reports that she has  been smoking Cigarettes.  She started smoking about 42 years ago. She has a 38.00 pack-year smoking history. She has never used smokeless tobacco. Ms. Alridge reports that she does not drink alcohol.   Review of Systems CONSTITUTIONAL: No weight loss, fever, chills, weakness or fatigue.  HEENT: Eyes: No visual loss, blurred vision, double vision or yellow sclerae.No hearing loss, sneezing, congestion, runny nose or sore throat.  SKIN: No rash or itching.  CARDIOVASCULAR: per hpi RESPIRATORY: No shortness of breath, cough or sputum.  GASTROINTESTINAL: No anorexia, nausea, vomiting or diarrhea. No abdominal pain or blood.  GENITOURINARY: No burning on urination, no polyuria NEUROLOGICAL: No headache, dizziness, syncope, paralysis, ataxia, numbness or tingling in the extremities. No change in bowel or bladder  control.  MUSCULOSKELETAL: No muscle, back pain, joint pain or stiffness.  LYMPHATICS: No enlarged nodes. No history of splenectomy.  PSYCHIATRIC: No history of depression or anxiety.  ENDOCRINOLOGIC: No reports of sweating, cold or heat intolerance. No polyuria or polydipsia.  Marland Kitchen   Physical Examination Vitals:   11/05/16 1428 11/05/16 1433  BP: 119/70 112/67  Pulse: 78 76   Vitals:   11/05/16 1428  Weight: 154 lb 3.2 oz (69.9 kg)  Height: 5' 3.5" (1.613 m)    Gen: resting comfortably, no acute distress HEENT: no scleral icterus, pupils equal round and reactive, no palptable cervical adenopathy,  CV: RRR, no m/r/g, no jvd Resp: Clear to auscultation bilaterally GI: abdomen is soft, non-tender, non-distended, normal bowel sounds, no hepatosplenomegaly MSK: extremities are warm, no edema.  Skin: warm, no rash Neuro:  no focal deficits Psych: appropriate affect   Diagnostic Studies  2006 cath  PROCEDURES PERFORMED:  1.  Selective coronary angiography.  2.  Left heart catheterization.  3.  Left ventriculogram.   DESCRIPTION OF PROCEDURE:  The risks and benefits of the catheterization  explained to Ms. Saline; consent was signed and placed on the chart. A 6-  [place] arterial sheath was placed in the right femoral artery using a  modified Seldinger technique. Standard catheters including preformed Judkins  JL-4, JR-4 and angled pigtail were used for the procedure. All catheter  exchanges made over wire. There were no apparent complications.   Central aortic pressure was 122/67 with a mean of 91. LV pressure is 121/6  with an LVEDP of 15. There was no aortic stenosis.   Left main was normal.   LAD was a long vessel that wrapped the apex. It gave off a very large  branching diagonal on the proximal portion. In the LAD proper, there was a  30% stenosis in the proximal portion before the take off the diagonal. In  the mid-section, there was a 50-60% focal stenosis which  was not flow-  limiting.   Left circumflex was a small system made up primarily of an OM-1. There was  40% focal stenosis in the OM-1.   Right coronary artery was a very large dominant vessel. It gave off a right  ventricular branch, moderate-to-large PDA and 5 or 6 small to moderate PL's.  There was a 30% lesion in the mid-section of the RCA; otherwise no  significant angiographic CAD.   Left ventriculogram done in the RAO approach showed an EF of 55-60% with no  focal wall motion abnormalities. There was no mitral regurgitation.   ASSESSMENT:  1.  Nonobstructive coronary disease.  2.  Normal left ventricular function.   PLAN/DISCUSSION:  She will need aggressive risk factor management including  smoking cessation. We will start  her on simvastatin 40 milligrams a day. She  [facility]. She will likely be able to be  discharged home today after her bedrest.   Assessment and Plan   1. Chest pain - unclear history, multiple CAD risk factors.  - we will order exercise nuclear stress test to further evaluate   F/u 1 month     Antoine Poche, M.D.

## 2016-11-05 NOTE — Patient Instructions (Signed)
Your physician recommends that you schedule a follow-up appointment in: 1 MONTH WITH DR BRANCH  Your physician recommends that you continue on your current medications as directed. Please refer to the Current Medication list given to you today.  Your physician has requested that you have en exercise stress myoview. For further information please visit [email or web address]. Please follow instruction sheet, as given.  Thank you for choosing [facility]!!    

## 2016-11-14 ENCOUNTER — Encounter (HOSPITAL_BASED_OUTPATIENT_CLINIC_OR_DEPARTMENT_OTHER)
Admission: RE | Admit: 2016-11-14 | Discharge: 2016-11-14 | Disposition: A | Discharge status: Home / Self Care | Payer: PPO | Source: Ambulatory Visit | Attending: Cardiology | Admitting: Cardiology

## 2016-11-14 ENCOUNTER — Encounter (HOSPITAL_COMMUNITY)
Admission: RE | Admit: 2016-11-14 | Discharge: 2016-11-14 | Disposition: A | Discharge status: Home / Self Care | Payer: PPO | Source: Ambulatory Visit | Attending: Cardiology | Admitting: Cardiology

## 2016-11-14 ENCOUNTER — Ambulatory Visit (HOSPITAL_COMMUNITY)
Admission: RE | Admit: 2016-11-14 | Discharge: 2016-11-14 | Disposition: A | Discharge status: Home / Self Care | Payer: PPO | Source: Ambulatory Visit | Attending: Nurse Practitioner | Admitting: Nurse Practitioner

## 2016-11-14 ENCOUNTER — Encounter (HOSPITAL_COMMUNITY): Payer: Self-pay

## 2016-11-14 DIAGNOSIS — Z122 Encounter for screening for malignant neoplasm of respiratory organs: Secondary | ICD-10-CM | POA: Insufficient documentation

## 2016-11-14 DIAGNOSIS — R079 Chest pain, unspecified: Secondary | ICD-10-CM

## 2016-11-14 DIAGNOSIS — E041 Nontoxic single thyroid nodule: Secondary | ICD-10-CM | POA: Diagnosis not present

## 2016-11-14 DIAGNOSIS — F17210 Nicotine dependence, cigarettes, uncomplicated: Secondary | ICD-10-CM | POA: Diagnosis not present

## 2016-11-14 DIAGNOSIS — I700 Atherosclerosis of aorta: Secondary | ICD-10-CM | POA: Diagnosis not present

## 2016-11-14 DIAGNOSIS — R9439 Abnormal result of other cardiovascular function study: Secondary | ICD-10-CM | POA: Insufficient documentation

## 2016-11-14 LAB — NM MYOCAR MULTI W/SPECT W/WALL MOTION / EF
CHL CUP NUCLEAR SDS: 1
CHL CUP NUCLEAR SRS: 2
CHL CUP NUCLEAR SSS: 3
CHL RATE OF PERCEIVED EXERTION: 15
CSEPED: 3 min
CSEPEW: 5.9 METS
CSEPPHR: 133 {beats}/min
Exercise duration (sec): 58 s
LV dias vol: 51 mL (ref 46–106)
LVSYSVOL: 17 mL
MPHR: 150 {beats}/min
Percent HR: 88 %
RATE: 0.34
Rest HR: 71 {beats}/min
TID: 0.62

## 2016-11-14 MED ORDER — REGADENOSON 0.4 MG/5ML IV SOLN
INTRAVENOUS | Status: AC
  Filled 2016-11-14: qty 5

## 2016-11-14 MED ORDER — TECHNETIUM TC 99M TETROFOSMIN IV KIT
10.0000 | PACK | Freq: Once | INTRAVENOUS | Status: AC | PRN
  Administered 2016-11-14: 11 via INTRAVENOUS

## 2016-11-14 MED ORDER — TECHNETIUM TC 99M TETROFOSMIN IV KIT
30.0000 | PACK | Freq: Once | INTRAVENOUS | Status: AC | PRN
  Administered 2016-11-14: 31.5 via INTRAVENOUS

## 2016-11-14 MED ORDER — SODIUM CHLORIDE 0.9% FLUSH
INTRAVENOUS | Status: AC
  Administered 2016-11-14: 10 mL via INTRAVENOUS
  Filled 2016-11-14: qty 10

## 2016-11-15 ENCOUNTER — Ambulatory Visit (HOSPITAL_COMMUNITY): Admission: RE | Admit: 2016-11-15 | Payer: PPO | Source: Ambulatory Visit

## 2016-11-15 ENCOUNTER — Ambulatory Visit (INDEPENDENT_AMBULATORY_CARE_PROVIDER_SITE_OTHER): Payer: PPO | Admitting: Pharmacist

## 2016-11-15 ENCOUNTER — Telehealth: Payer: Self-pay | Admitting: *Deleted

## 2016-11-15 DIAGNOSIS — D72828 Other elevated white blood cell count: Secondary | ICD-10-CM

## 2016-11-15 DIAGNOSIS — D72829 Elevated white blood cell count, unspecified: Secondary | ICD-10-CM | POA: Diagnosis not present

## 2016-11-15 DIAGNOSIS — M810 Age-related osteoporosis without current pathological fracture: Secondary | ICD-10-CM | POA: Diagnosis not present

## 2016-11-15 MED ORDER — DENOSUMAB 60 MG/ML ~~LOC~~ SOLN
60.0000 mg | Freq: Once | SUBCUTANEOUS | Status: AC
  Administered 2016-11-15: 60 mg via SUBCUTANEOUS

## 2016-11-15 NOTE — Telephone Encounter (Signed)
-----   Message from Antoine PocheJonathan F Branch, MD sent at 11/15/2016 11:24 AM EDT ----- Overall stress test looks good, some very mild changes not overally consistent with blockages. WE will discuss further at f/u, overall test looks good  J BrancH MD

## 2016-11-15 NOTE — Telephone Encounter (Signed)
Pt aware - routed to pcp  

## 2016-11-15 NOTE — Progress Notes (Signed)
Patient ID: Kristen Munoz, female   DOB: [birth year], 71 y.o.   MRN: [id number]  [facility]   HPI: Patient with osteoporosis has been getting Prolia 60mg  SQ q 6 months since 11/2012. She is due to next Prolia today.  Prolia was delayed by 2 weeks while getting prior authorization from insurance. Last Prolia injection was  Med(s) previously tried for Osteoporosis/Osteopenia:  Calcium - severe constipation,                                         Fosamax - constipation                                        Boniva 150mg  q month - worsened GERD                                                              PMH: Age at menopause:  Surgical in 1976 Hysterectomy?  Yes Oophorectomy?  No HRT? Yes - Former.  Type/duration: estrogen Steroid Use?  No Thyroid med?  Yes History of cancer?  No  History of digestive disorders (ie Crohn's)?  Yes - GERD, currently taking PPI Current or previous eating disorders?  No Last Vitamin D Result:  19.3 (10/10/2016) Last GFR Result:  46 (10/10/2016)   FH/SH: Family history of osteoporosis?  Yes - mother Parent with history of hip fracture?  No Family history of breast cancer?  No Exercise?  No Smoking?  Yes  .  Alcohol?  No    Calcium Assessment Calcium Intake  # of servings/day  Calcium mg  Milk (8 oz) 0  x  300  = 0  Yogurt (4 oz) 2 x  200 = 400mg   Cheese (1 oz) 1 x  200 = 200mg   Other Calcium sources   250mg   Ca supplement 0 = 0   Estimated calcium intake per day 850mg     DEXA Results Date of Test T-Score for AP Spine L1-L4 T-Score for Total Left Hip T-Score for Total Right Hip  02/11/2015 -1.7 -1.6 -1.6  10/29/2012 -1.8 -1.9 -2.0  12/15/2008 -2.5 -1.7 -1.8  10/14/2006 -2.6 -1.9 -2.1  02/24/1998 -2.0 -1.5 --      Assessment: Osteoporosis with improved BMD of spine and both hips since starting Prolia High fracture risk and low calcium intake - unable to take calcium supplements Hyperlipidemia HTN -  controlled  Recommendations: 1.  Continue Prolia 60mg  SQ q 6 months - given today.  Next due 05/19/2017  2.  recommend calcium 1200mg  daily either through supplementation or diet.   3.  recommend weight bearing exercise - 30 minutes at least 4 days per week.   4.  Counseled and educated about fall risk and prevention. 5.  Influenza vaccine given today 6.  Patient is due mammogram - the one order 10/2015 looks like it was completed but was not - [facility] in [place] rescheduled and patient aware for time and date   No orders of the defined types were placed in this encounter.   Recheck DEXA:  after 02/16/2017  Time spent counseling patient:  30 minutes  Kristen Munoz, PharmD, CPP

## 2016-11-15 NOTE — Patient Instructions (Signed)
Calcium & Vitamin D: The Facts  Why is calcium and vitamin D consumption important? Calcium: . Most Americans do not consume adequate amounts of calcium! Calcium is required for proper muscle function, nerve communication, bone support, and many other functions in the body.  . The body uses bones as a source of calcium. Bones 'remodel' themselves continuously - the body constantly breaks bone down to release calcium and rebuilds bones by replacing calcium in the bone later.  . As we get older, the rate of bone breakdown occurs faster than bone rebuilding which could lead to osteopenia, osteoporosis, and possible fractures.   Vitamin D: . People naturally make vitamin D in the body when sunlight hits the skin and triggers a process that leads to vitamin D production. This natural vitamin D production requires about 10-15 minutes of sun exposure on the hands, arms, and face at least 2-3 times per week. However, due to decreased sun exposure and the use of sunscreen, most people will need to get additional vitamin D from foods or supplements. Your doctor can measure your body's vitamin D level through a simple blood test to determine your daily vitamin D needs.  . Vitamin D is used to help the body absorb calcium, maintain bone health, help the immune system, and reduce inflammation. It also plays a role in muscle performance, balance and risk of falling.  . Vitamin D deficiency can lead to osteomalacia or softening of the bones, bone pain, and muscle weakness.   The recommended daily allowance of Calcium and Vitamin D varies for different age groups. Age group Calcium (mg) Vitamin D (IU)  Females and Males: Age 19-50 1000 mg 600 IU  Females: Age 51- 70 1200 mg 600 IU  Males: Age 51-70 1000 mg 600 IU  Females and Males: Age 71+ 1200 mg 800 IU  Pregnant/lactating Females age 19-50 1000 mg 600 IU   How much Calcium do you get in your diet? Calcium Intake # of servings per day  Total calcium (mg)   Skim milk, 2% milk (1 cup) _________ x 300 mg   Yogurt (1 small container) _________ x 200 mg   Cheese (1oz) _________ x 200 mg   Cottage Cheese (1 cup)             ________ x 150 mg   Almond milk (1 cup) _________ x 450 mg   Fortified Orange Juice (1 cup) _________ x 300 mg   Broccoli or spinach ( 1 cup) _________ x 100 mg   Salmon (3 oz) _________ x 150 mg    Almonds (1/4 cup) _______ x 90 mg      How do we get Calcium and Vitamin D in our diet? Calcium: . Obtaining calcium from the diet is the most preferred way to reach the recommended daily goal. If this goal is not reached through diet, calcium supplements are available.  . Calcium is found in many foods including: dairy products, dark leafy vegetables (like broccoli, kale, and spinach), fish, and fortified products like juices and cereals.  . The food label will have a %DV (percent daily value) listed showing the amount of calcium per serving. To determine the total mg per serving, simply replace the % with zero (0).  For example, Almond Breeze almond milk contains 45% DV of calcium or 450mg per 1 cup.  . You can increase the amount of calcium in your diet by using more calcium products in your daily meals. Use yogurt and fruit to   make smoothies or use yogurt to top baked potatoes or make whipped potatoes. Sprinkle low fat cheese onto salads or into egg white omelets. You can even add non-fat dry milk powder (300mg calcium per 1/3 cup) to hot cereals, meat loaf, soups, or potatoes.  . Calcium supplements come in many forms including tablets, chewables, and gummies. Be sure to read the label to determine the correct number of tablets per serving and whether or not to take the supplement with food.  . Calcium carbonate products (Oscal, Caltrate, and Viactiv) are generally better absorbed when taken with food while calcium citrate products like Citracal can be taken with or without food.  . The body can only absorb about 600 mg of calcium  at one time. It is recommended to take calcium supplements in small amounts several times per day.  However, taking it all at once is better than not taking it at all. . Increasing your intake of calcium is essential for bone health, but may also lead to some side effects like constipation, increased gas, bloating or abdominal cramping. To help reduce these side effects, start with 1 tablet per day and slowly increase your intake of the supplement to the recommended doses. It is also recommended that you drink plenty of water each day. Vitamin D: . Very few foods naturally contain vitamin D. However, it is found in saltwater fish (like tuna, salmon and mackerel), beef liver, egg yolks, cheese and vitamin D fortified foods (like yogurt, cereals, orange juice and milk) . The amount of vitamin D in each food or product is listed as %DV on the product label. To determine the total amount of vitamin D per serving, drop the % sign and multiply the number by 4. For example, 1 cup of Almond Breeze almond milk contains 25% DV vitamin D or 100 IU per serving (25 x 4 =100). . Vitamin D is also found in multivitamins and supplements and may be listed as ergocalciferol (vitamin D2) or cholecalciferol (vitamin D3). Each of these forms of vitamin D are equivalent and the daily recommended intake will vary based on your age and the vitamin D levels in your body. Follow your doctor's recommendation for vitamin D intake.       Fall Prevention in the Home Falls can cause injuries and can affect people from all age groups. There are many simple things that you can do to make your home safe and to help prevent falls. What can I do on the outside of my home? Regularly repair the edges of walkways and driveways and fix any cracks. Remove high doorway thresholds. Trim any shrubbery on the main path into your home. Use bright outdoor lighting. Clear walkways of debris and clutter, including tools and rocks. Regularly check  that handrails are securely fastened and in good repair. Both sides of any steps should have handrails. Install guardrails along the edges of any raised decks or porches. Have leaves, snow, and ice cleared regularly. Use sand or salt on walkways during winter months. In the garage, clean up any spills right away, including grease or oil spills. What can I do in the bathroom? Use night lights. Install grab bars by the toilet and in the tub and shower. Do not use towel bars as grab bars. Use non-skid mats or decals on the floor of the tub or shower. If you need to sit down while you are in the shower, use a plastic, non-slip stool. Keep the floor dry. Immediately clean up   any water that spills on the floor. Remove soap buildup in the tub or shower on a regular basis. Attach bath mats securely with double-sided non-slip rug tape. Remove throw rugs and other tripping hazards from the floor. What can I do in the bedroom? Use night lights. Make sure that a bedside light is easy to reach. Do not use oversized bedding that drapes onto the floor. Have a firm chair that has side arms to use for getting dressed. Remove throw rugs and other tripping hazards from the floor. What can I do in the kitchen? Clean up any spills right away. Avoid walking on wet floors. Place frequently used items in easy-to-reach places. If you need to reach for something above you, use a sturdy step stool that has a grab bar. Keep electrical cables out of the way. Do not use floor polish or wax that makes floors slippery. If you have to use wax, make sure that it is non-skid floor wax. Remove throw rugs and other tripping hazards from the floor. What can I do in the stairways? Do not leave any items on the stairs. Make sure that there are handrails on both sides of the stairs. Fix handrails that are broken or loose. Make sure that handrails are as long as the stairways. Check any carpeting to make sure that it is firmly  attached to the stairs. Fix any carpet that is loose or worn. Avoid having throw rugs at the top or bottom of stairways, or secure the rugs with carpet tape to prevent them from moving. Make sure that you have a light switch at the top of the stairs and the bottom of the stairs. If you do not have them, have them installed. What are some other fall prevention tips? Wear closed-toe shoes that fit well and support your feet. Wear shoes that have rubber soles or low heels. When you use a stepladder, make sure that it is completely opened and that the sides are firmly locked. Have someone hold the ladder while you are using it. Do not climb a closed stepladder. Add color or contrast paint or tape to grab bars and handrails in your home. Place contrasting color strips on the first and last steps. Use mobility aids as needed, such as canes, walkers, scooters, and crutches. Turn on lights if it is dark. Replace any light bulbs that burn out. Set up furniture so that there are clear paths. Keep the furniture in the same spot. Fix any uneven floor surfaces. Choose a carpet design that does not hide the edge of steps of a stairway. Be aware of any and all pets. Review your medicines with your healthcare provider. Some medicines can cause dizziness or changes in blood pressure, which increase your risk of falling. Talk with your health care provider about other ways that you can decrease your risk of falls. This may include working with a physical therapist or trainer to improve your strength, balance, and endurance. This information is not intended to replace advice given to you by your health care provider. Make sure you discuss any questions you have with your health care provider. Document Released: 06/08/2002 Document Revised: 11/15/2015 Document Reviewed: 07/23/2014 Elsevier Interactive Patient Education  2017 [employer]  

## 2016-11-16 LAB — CBC WITH DIFFERENTIAL/PLATELET
Basophils Absolute: 0 10*3/uL (ref 0.0–0.2)
Basos: 1 %
EOS (ABSOLUTE): 0.1 10*3/uL (ref 0.0–0.4)
Eos: 1 %
HEMATOCRIT: 32.9 % — AB (ref 34.0–46.6)
HEMOGLOBIN: 10.8 g/dL — AB (ref 11.1–15.9)
IMMATURE GRANULOCYTES: 0 %
Immature Grans (Abs): 0 10*3/uL (ref 0.0–0.1)
LYMPHS: 36 %
Lymphocytes Absolute: 3 10*3/uL (ref 0.7–3.1)
MCH: 29.8 pg (ref 26.6–33.0)
MCHC: 32.8 g/dL (ref 31.5–35.7)
MCV: 91 fL (ref 79–97)
MONOCYTES: 8 %
MONOS ABS: 0.7 10*3/uL (ref 0.1–0.9)
NEUTROS PCT: 54 %
Neutrophils Absolute: 4.5 10*3/uL (ref 1.4–7.0)
Platelets: 228 10*3/uL (ref 150–379)
RBC: 3.63 x10E6/uL — AB (ref 3.77–5.28)
RDW: 13.8 % (ref 12.3–15.4)
WBC: 8.2 10*3/uL (ref 3.4–10.8)

## 2016-11-28 ENCOUNTER — Other Ambulatory Visit: Payer: Self-pay | Admitting: Nurse Practitioner

## 2016-11-28 NOTE — Telephone Encounter (Signed)
Please address refill on clonazepam - last seen MMM 10/11/16.

## 2016-11-29 NOTE — Telephone Encounter (Signed)
Please call in clorazepam with 1 refills

## 2016-11-29 NOTE — Telephone Encounter (Signed)
Phoned in.

## 2016-12-06 ENCOUNTER — Encounter: Payer: Self-pay | Admitting: Cardiology

## 2016-12-06 ENCOUNTER — Ambulatory Visit (INDEPENDENT_AMBULATORY_CARE_PROVIDER_SITE_OTHER): Payer: PPO | Admitting: Cardiology

## 2016-12-06 VITALS — BP 119/67 | HR 71 | Ht 63.5 in | Wt 154.4 lb

## 2016-12-06 DIAGNOSIS — R0789 Other chest pain: Secondary | ICD-10-CM | POA: Diagnosis not present

## 2016-12-06 MED ORDER — METOPROLOL TARTRATE 25 MG PO TABS: 12.5000 mg | ORAL_TABLET | Freq: Two times a day (BID) | ORAL | 1 refills | Status: DC

## 2016-12-06 NOTE — Patient Instructions (Signed)
Your physician wants you to follow-up in: 6 MONTHS [facility] You will receive a reminder letter in the mail two months in advance. If you don't receive a letter, please call our office to schedule the follow-up appointment.  Your physician has recommended you make the following change in your medication:   START LOPRESSOR (MEOTPROLOL) 12.5 MG TWICE DAILY   Thank you for choosing [facility]!!

## 2016-12-06 NOTE — Progress Notes (Signed)
Clinical Summary Kristen Munoz is a 71 y.o.female seen for follow up of the following medical problems.   1. Chest pain - cath 2006 mild to moderate nonobstructive CAD - CT A/P with aortiliac atherosclerosis.   10/2016 nuclear stress: probable apical and inferior apical artifact. Cannot exclude small area of mild ischemia.  CT 10/2016 for lung CA screen with coronary calcification - symptoms have improved since last visit  2. Anxiety - caregiver for her husband who has multiple medical issues, source of significant anxiety.   Past Medical History:  Diagnosis Date  . CAD (coronary artery disease)   . GERD (gastroesophageal reflux disease)   . Hyperlipidemia   . Hypertension   . Insomnia   . Osteoporosis   . Thyroid disease   . Urinary bladder incontinence      Allergies  Allergen Reactions  . Sulfa Antibiotics Nausea Only     Current Outpatient Prescriptions  Medication Sig Dispense Refill  . aspirin EC 81 MG tablet Take 81 mg by mouth daily.    . clonazePAM (KLONOPIN) 0.5 MG tablet TAKE ONE TABLET BY MOUTH TWICE DAILY AS NEEDED 60 tablet 1  . denosumab (PROLIA) 60 MG/ML SOLN injection Inject 60 mg into the skin every 6 (six) months. Administer in upper arm, thigh, or abdomen    . escitalopram (LEXAPRO) 20 MG tablet Take 1 tablet (20 mg total) by mouth daily. 90 tablet 1  . levothyroxine (SYNTHROID, LEVOTHROID) 25 MCG tablet TAKE ONE TABLET BY MOUTH ONCE DAILY BEFORE BREAKFAST 90 tablet 3  . lisinopril-hydrochlorothiazide (PRINZIDE,ZESTORETIC) 10-12.5 MG tablet Take 1 tablet by mouth daily. 90 tablet 0  . pantoprazole (PROTONIX) 40 MG tablet Take 1 tablet (40 mg total) by mouth daily. 90 tablet 1  . simvastatin (ZOCOR) 40 MG tablet Take 1 tablet (40 mg total) by mouth daily. 90 tablet 1  . Vitamin D, Ergocalciferol, (DRISDOL) 50000 units CAPS capsule Take 1 capsule (50,000 Units total) by mouth every 7 (seven) days. 12 capsule 0   No current facility-administered  medications for this visit.      Past Surgical History:  Procedure Laterality Date  . ABDOMINAL HYSTERECTOMY     partial  . bladder      baldder tack  . THYROID SURGERY    . VESICOVAGINAL FISTULA CLOSURE W/ TAH       Allergies  Allergen Reactions  . Sulfa Antibiotics Nausea Only      Family History  Problem Relation Age of Onset  . Cancer Mother        colon  . Hypertension Mother   . Diabetes Mother   . Heart disease Mother   . Cancer Father        lung  . Diabetes Father   . Hypertension Father   . Heart disease Father   . Cancer Sister        bone  . Heart failure Sister   . Cancer Brother        kidney  . Early death Brother        gun shot  . Birth defects Brother   . Stevens-Johnson syndrome Sister   . Anxiety disorder Sister   . Depression Sister   . Drug abuse Paternal Uncle      Social History Ms. Knappenberger reports that she has been smoking Cigarettes.  She started smoking about 42 years ago. She has a 38.00 pack-year smoking history. She has never used smokeless tobacco. Kristen Munoz reports that she does  not drink alcohol.   Review of Systems CONSTITUTIONAL: No weight loss, fever, chills, weakness or fatigue.  HEENT: Eyes: No visual loss, blurred vision, double vision or yellow sclerae.No hearing loss, sneezing, congestion, runny nose or sore throat.  SKIN: No rash or itching.  CARDIOVASCULAR: per hpi RESPIRATORY: No shortness of breath, cough or sputum.  GASTROINTESTINAL: No anorexia, nausea, vomiting or diarrhea. No abdominal pain or blood.  GENITOURINARY: No burning on urination, no polyuria NEUROLOGICAL: No headache, dizziness, syncope, paralysis, ataxia, numbness or tingling in the extremities. No change in bowel or bladder control.  MUSCULOSKELETAL: No muscle, back pain, joint pain or stiffness.  LYMPHATICS: No enlarged nodes. No history of splenectomy.  PSYCHIATRIC: +anxiety ENDOCRINOLOGIC: No reports of sweating, cold or heat intolerance. No  polyuria or polydipsia.  Marland Kitchen   Physical Examination Vitals:   12/06/16 1411  BP: 119/67  Pulse: 71   Vitals:   12/06/16 1411  Weight: 154 lb 6.4 oz (70 kg)  Height: 5' 3.5" (1.613 m)    Gen: resting comfortably, no acute distress HEENT: no scleral icterus, pupils equal round and reactive, no palptable cervical adenopathy,  CV: RRR, no mrg, no jvd Resp: Clear to auscultation bilaterally GI: abdomen is soft, non-tender, non-distended, normal bowel sounds, no hepatosplenomegaly MSK: extremities are warm, no edema.  Skin: warm, no rash Neuro:  no focal deficits Psych: appropriate affect   Diagnostic Studies 2006 cath PROCEDURES PERFORMED: 1. Selective coronary angiography. 2. Left heart catheterization. 3. Left ventriculogram.  DESCRIPTION OF PROCEDURE: The risks and benefits of the catheterization explained to Ms. Ganci; consent was signed and placed on the chart. A 6- [place] arterial sheath was placed in the right femoral artery using a modified Seldinger technique. Standard catheters including preformed Judkins JL-4, JR-4 and angled pigtail were used for the procedure. All catheter exchanges made over wire. There were no apparent complications.  Central aortic pressure was 122/67 with a mean of 91. LV pressure is 121/6 with an LVEDP of 15. There was no aortic stenosis.  Left main was normal.  LAD was a long vessel that wrapped the apex. It gave off a very large branching diagonal on the proximal portion. In the LAD proper, there was a 30% stenosis in the proximal portion before the take off the diagonal. In the mid-section, there was a 50-60% focal stenosis which was not flow- limiting.  Left circumflex was a small system made up primarily of an OM-1. There was 40% focal stenosis in the OM-1.  Right coronary artery was a very large dominant vessel. It gave off a right ventricular Simona Rocque, moderate-to-large PDA and 5 or 6 small to  moderate PL's. There was a 30% lesion in the mid-section of the RCA; otherwise no significant angiographic CAD.  Left ventriculogram done in the RAO approach showed an EF of 55-60% with no focal wall motion abnormalities. There was no mitral regurgitation.  ASSESSMENT: 1. Nonobstructive coronary disease. 2. Normal left ventricular function.  PLAN/DISCUSSION: She will need aggressive risk factor management including smoking cessation. We will start her on simvastatin 40 milligrams a day. She will follow back up in the [place] office. She will likely be able to be discharged home today after her bedrest.   10/2016 Nuclear stress test  No diagnostic ST segment changes in the setting of lead motion artifact. No arrhythmias were noted. No chest pain reported patient did describe shortness of breath and lightheadedness. Overall intermediate risk Duke treadmill score of 4 related to exercise time.  Blood pressure  demonstrated a hypertensive response to exercise.  Small, mild intensity, mid to apical anterior defect that is fixed and most consistent with soft tissue attenuation.  Small, mild intensity, inferior apical defect that is partially reversible in the setting of gut uptake of radiotracer near the inferior wall on rest imaging. Cannot exclude a mild ischemic territory,but this could be due to artifact alone.  This is a low risk study based on perfusion images.  Nuclear stress EF: 66%. Assessment and Plan   1. Chest pain - low risk stress test. Probable artifact, cannot exluced mild ischemia - symptoms improved since last visit but not resolved - we will try lopressor 12.5mg  bid as potential antianginal     Antoine Poche, M.D.

## 2017-04-18 ENCOUNTER — Other Ambulatory Visit: Payer: Self-pay | Admitting: Nurse Practitioner

## 2017-04-19 NOTE — Telephone Encounter (Signed)
Has not been seen by PCP since 09/2016.  She needs an appointment w/ her PCP for refills on this controlled substance.  Please inform patient.

## 2017-04-23 ENCOUNTER — Other Ambulatory Visit: Payer: Self-pay | Admitting: Nurse Practitioner

## 2017-04-25 NOTE — Telephone Encounter (Signed)
rx called into pharmacy

## 2017-04-25 NOTE — Telephone Encounter (Signed)
Please call in clonazepam with 1 refills 

## 2017-05-02 ENCOUNTER — Other Ambulatory Visit: Payer: Self-pay | Admitting: *Deleted

## 2017-05-02 DIAGNOSIS — I10 Essential (primary) hypertension: Secondary | ICD-10-CM

## 2017-05-02 MED ORDER — LISINOPRIL-HYDROCHLOROTHIAZIDE 10-12.5 MG PO TABS: 1.0000 | ORAL_TABLET | Freq: Every day | ORAL | 0 refills | Status: DC

## 2017-05-15 ENCOUNTER — Other Ambulatory Visit: Payer: Self-pay | Admitting: Nurse Practitioner

## 2017-05-20 ENCOUNTER — Encounter: Payer: Self-pay | Admitting: Nurse Practitioner

## 2017-05-20 ENCOUNTER — Telehealth: Payer: Self-pay | Admitting: Nurse Practitioner

## 2017-05-20 ENCOUNTER — Ambulatory Visit (INDEPENDENT_AMBULATORY_CARE_PROVIDER_SITE_OTHER): Payer: PPO | Admitting: Nurse Practitioner

## 2017-05-20 VITALS — BP 111/70 | HR 79 | Temp 97.3°F | Ht 63.5 in | Wt 157.0 lb

## 2017-05-20 DIAGNOSIS — E041 Nontoxic single thyroid nodule: Secondary | ICD-10-CM

## 2017-05-20 DIAGNOSIS — I10 Essential (primary) hypertension: Secondary | ICD-10-CM

## 2017-05-20 DIAGNOSIS — E782 Mixed hyperlipidemia: Secondary | ICD-10-CM | POA: Diagnosis not present

## 2017-05-20 DIAGNOSIS — D509 Iron deficiency anemia, unspecified: Secondary | ICD-10-CM

## 2017-05-20 DIAGNOSIS — F411 Generalized anxiety disorder: Secondary | ICD-10-CM

## 2017-05-20 DIAGNOSIS — K219 Gastro-esophageal reflux disease without esophagitis: Secondary | ICD-10-CM

## 2017-05-20 DIAGNOSIS — I2510 Atherosclerotic heart disease of native coronary artery without angina pectoris: Secondary | ICD-10-CM

## 2017-05-20 DIAGNOSIS — F3342 Major depressive disorder, recurrent, in full remission: Secondary | ICD-10-CM

## 2017-05-20 DIAGNOSIS — Z23 Encounter for immunization: Secondary | ICD-10-CM

## 2017-05-20 DIAGNOSIS — M25512 Pain in left shoulder: Secondary | ICD-10-CM

## 2017-05-20 DIAGNOSIS — M810 Age-related osteoporosis without current pathological fracture: Secondary | ICD-10-CM | POA: Diagnosis not present

## 2017-05-20 DIAGNOSIS — F5101 Primary insomnia: Secondary | ICD-10-CM | POA: Diagnosis not present

## 2017-05-20 DIAGNOSIS — N393 Stress incontinence (female) (male): Secondary | ICD-10-CM

## 2017-05-20 DIAGNOSIS — E039 Hypothyroidism, unspecified: Secondary | ICD-10-CM

## 2017-05-20 MED ORDER — METOPROLOL TARTRATE 25 MG PO TABS: 12.5000 mg | ORAL_TABLET | Freq: Two times a day (BID) | ORAL | 1 refills | Status: DC

## 2017-05-20 MED ORDER — LEVOTHYROXINE SODIUM 25 MCG PO TABS: ORAL_TABLET | ORAL | 3 refills | Status: DC

## 2017-05-20 MED ORDER — CLONAZEPAM 0.5 MG PO TABS: 0.5000 mg | ORAL_TABLET | Freq: Two times a day (BID) | ORAL | 1 refills | Status: DC | PRN

## 2017-05-20 MED ORDER — PANTOPRAZOLE SODIUM 40 MG PO TBEC: 40.0000 mg | DELAYED_RELEASE_TABLET | Freq: Every day | ORAL | 1 refills | Status: DC

## 2017-05-20 MED ORDER — FERROUS SULFATE 325 (65 FE) MG PO TABS: 325.0000 mg | ORAL_TABLET | Freq: Every day | ORAL | 1 refills | Status: DC

## 2017-05-20 MED ORDER — LISINOPRIL-HYDROCHLOROTHIAZIDE 10-12.5 MG PO TABS: 1.0000 | ORAL_TABLET | Freq: Every day | ORAL | 0 refills | Status: DC

## 2017-05-20 NOTE — Progress Notes (Signed)
Subjective:    Patient ID: Kristen Munoz, female    DOB: 1946-03-04, 71 y.o.   MRN: [id number]  HPI  Kristen Munoz is here today for follow up of chronic medical problem.  Outpatient Encounter Medications as of 05/20/2017  Medication Sig  . aspirin EC 81 MG tablet Take 81 mg by mouth daily.  . clonazePAM (KLONOPIN) 0.5 MG tablet TAKE 1 TABLET BY MOUTH TWICE DAILY AS NEEDED  . denosumab (PROLIA) 60 MG/ML SOLN injection Inject 60 mg into the skin every 6 (six) months. Administer in upper arm, thigh, or abdomen  . escitalopram (LEXAPRO) 20 MG tablet TAKE 1 TABLET BY MOUTH ONCE DAILY  . ferrous sulfate 325 (65 FE) MG tablet Take 325 mg by mouth daily.  Marland Kitchen levothyroxine (SYNTHROID, LEVOTHROID) 25 MCG tablet TAKE ONE TABLET BY MOUTH ONCE DAILY BEFORE BREAKFAST  . lisinopril-hydrochlorothiazide (PRINZIDE,ZESTORETIC) 10-12.5 MG tablet Take 1 tablet by mouth daily.  . pantoprazole (PROTONIX) 40 MG tablet Take 1 tablet (40 mg total) by mouth daily.  . simvastatin (ZOCOR) 40 MG tablet Take 1 tablet (40 mg total) by mouth daily.  . Vitamin D, Ergocalciferol, (DRISDOL) 50000 units CAPS capsule Take 1 capsule (50,000 Units total) by mouth every 7 (seven) days.  . metoprolol tartrate (LOPRESSOR) 25 MG tablet Take 0.5 tablets (12.5 mg total) by mouth 2 (two) times daily.   No facility-administered encounter medications on file as of 05/20/2017.     1. Coronary artery disease involving native coronary artery of native heart without angina pectoris  Saw cardiologist on 12/06/2016. Doing well- no medication changes  2. Essential hypertension, benign  No c/o chest pain, ,sob or headaches. Does not check blood pressure at home. BP Readings from Last 3 Encounters:  05/20/17 111/70  12/06/16 119/67  11/05/16 112/67     3. Gastroesophageal reflux disease, esophagitis presence not specified  She is on protonix daily- works well  To keep symptoms under control  4. Hypothyroidism, unspecified type   no  problems that she is aware of  5. Age-related osteoporosis without current pathological fracture  Denies back pain. Does very little weight bearing exercises  6. Mixed hyperlipidemia  Not watching diet  7. Recurrent major depressive disorder, in full remission ([place])  Is on lexapro an dis doing well. Depression screen [facility] 2/9 05/20/2017 10/10/2016 04/18/2016  Decreased Interest '1 3 1  ' Down, Depressed, Hopeless '1 3 2  ' PHQ - 2 Score '2 6 3  ' Altered sleeping '1 1 2  ' Tired, decreased energy '1 2 2  ' Change in appetite 0 0 0  Feeling bad or failure about yourself  '1 2 2  ' Trouble concentrating 1 1 0  Moving slowly or fidgety/restless 0 0 0  Suicidal thoughts 0 0 0  PHQ-9 Score '6 12 9  ' Difficult doing work/chores Somewhat difficult - Somewhat difficult     8. 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 Doe snot want to take anytrhing  9. GAD (generalized anxiety disorder)  Very stress ed taking care of her husband. The lexapro helps  10. Stress incontinence, female  Wears depends daily    New complaints: -Left shoulder pian- thinks it is hurting from tugging and puling on her husband. -ct of chest 10/2016 showed thyroid mass- was never evaluated  Social history:  Is the sole caregiver of her husband   Review of Systems  Constitutional: Negative  for activity change and appetite change.  HENT: Negative.   Eyes: Negative for pain.  Respiratory: Negative for shortness of breath.   Cardiovascular: Negative for chest pain, palpitations and leg swelling.  Gastrointestinal: Negative for abdominal pain.  Endocrine: Negative for polydipsia.  Genitourinary: Negative.   Skin: Negative for rash.  Neurological: Negative for dizziness, weakness and headaches.  Hematological: Does not bruise/bleed easily.  Psychiatric/Behavioral: Negative.   All other systems reviewed and are negative.      Objective:   Physical  Exam  Constitutional: She is oriented to person, place, and time. She appears well-developed and well-nourished.  HENT:  Nose: Nose normal.  Mouth/Throat: Oropharynx is clear and moist.  Eyes: EOM are normal.  Neck: Trachea normal, normal range of motion and full passive range of motion without pain. Neck supple. No JVD present. Carotid bruit is not present. No thyromegaly present.  No palpable thyroid nodule  Cardiovascular: Normal rate, regular rhythm, normal heart sounds and intact distal pulses. Exam reveals no gallop and no friction rub.  No murmur heard. Pulmonary/Chest: Effort normal and breath sounds normal.  Abdominal: Soft. Bowel sounds are normal. She exhibits no distension and no mass. There is no tenderness.  Musculoskeletal: Normal range of motion.  Lymphadenopathy:    She has no cervical adenopathy.  Neurological: She is alert and oriented to person, place, and time. She has normal reflexes.  Skin: Skin is warm and dry.  Psychiatric: She has a normal mood and affect. Her behavior is normal. Judgment and thought content normal.    BP 111/70   Pulse 79   Temp (!) 97.3 F (36.3 C) (Oral)   Ht 5' 3.5" (1.613 m)   Wt 157 lb (71.2 kg)   BMI 27.38 kg/m        Assessment & Plan:  1. Coronary artery disease involving native coronary artery of native heart without angina pectoris Keep follow up with cardiology every 6 months  2. Essential hypertension, benign Low sodium diet - lisinopril-hydrochlorothiazide (PRINZIDE,ZESTORETIC) 10-12.5 MG tablet; Take 1 tablet daily by mouth.  Dispense: 90 tablet; Refill: 0 - metoprolol tartrate (LOPRESSOR) 25 MG tablet; Take 0.5 tablets (12.5 mg total) 2 (two) times daily by mouth.  Dispense: 90 tablet; Refill: 1 - CMP14+EGFR  3. Gastroesophageal reflux disease, esophagitis presence not specified Avoid spicy foods Do not eat 2 hours prior to bedtime - pantoprazole (PROTONIX) 40 MG tablet; Take 1 tablet (40 mg total) daily by mouth.   Dispense: 90 tablet; Refill: 1    5. Age-related osteoporosis without current pathological fracture Weight bearing exercises encouraged  6. Mixed hyperlipidemia Low fat diet - Lipid panel  7. Recurrent major depressive disorder, in full remission ([place]) stress management  8. Primary insomnia Bedtime routine  9. GAD (generalized anxiety disorder) - clonazePAM (KLONOPIN) 0.5 MG tablet; Take 1 tablet (0.5 mg total) 2 (two) times daily as needed by mouth.  Dispense: 60 tablet; Refill: 1  10. Stress incontinence, female Do not over drink liquids  11. Hypothyroidism - levothyroxine (SYNTHROID, LEVOTHROID) 25 MCG tablet; TAKE ONE TABLET BY MOUTH ONCE DAILY BEFORE BREAKFAST  Dispense: 90 tablet; Refill: 3 - Thyroid Panel With TSH  12. Iron deficiency anemia, unspecified iron deficiency anemia type Labs pending - ferrous sulfate 325 (65 FE) MG tablet; Take 1 tablet (325 mg total) daily by mouth.  Dispense: 90 tablet; Refill: 1 - CBC with Differential/Platelet  13. Acute pain of left shoulder Continue motrin Icy hot - Ambulatory referral to Orthopedic Surgery  14. Thyroid nodule Will call with ct results - US Soft Tissue Head/Neck; Future    Labs pending Health maintenance reviewed Diet and exercise encouraged Continue all meds Follow up  In 6 months   [place], FNP

## 2017-05-20 NOTE — Telephone Encounter (Signed)
error 

## 2017-05-21 LAB — CMP14+EGFR
ALBUMIN: 4.4 g/dL (ref 3.5–4.8)
ALK PHOS: 70 IU/L (ref 39–117)
ALT: 10 IU/L (ref 0–32)
AST: 12 IU/L (ref 0–40)
Albumin/Globulin Ratio: 1.7 (ref 1.2–2.2)
BILIRUBIN TOTAL: 0.3 mg/dL (ref 0.0–1.2)
BUN/Creatinine Ratio: 15 (ref 12–28)
BUN: 17 mg/dL (ref 8–27)
CHLORIDE: 100 mmol/L (ref 96–106)
CO2: 23 mmol/L (ref 20–29)
CREATININE: 1.11 mg/dL — AB (ref 0.57–1.00)
Calcium: 9.4 mg/dL (ref 8.7–10.3)
GFR calc Af Amer: 58 mL/min/{1.73_m2} — ABNORMAL LOW (ref >59)
GFR calc non Af Amer: 50 mL/min/{1.73_m2} — ABNORMAL LOW (ref >59)
GLUCOSE: 88 mg/dL (ref 65–99)
Globulin, Total: 2.6 g/dL (ref 1.5–4.5)
Potassium: 4.3 mmol/L (ref 3.5–5.2)
Sodium: 140 mmol/L (ref 134–144)
Total Protein: 7 g/dL (ref 6.0–8.5)

## 2017-05-21 LAB — CBC WITH DIFFERENTIAL/PLATELET
BASOS ABS: 0 10*3/uL (ref 0.0–0.2)
Basos: 0 %
EOS (ABSOLUTE): 0.1 10*3/uL (ref 0.0–0.4)
Eos: 1 %
HEMOGLOBIN: 12 g/dL (ref 11.1–15.9)
Hematocrit: 36.6 % (ref 34.0–46.6)
IMMATURE GRANS (ABS): 0 10*3/uL (ref 0.0–0.1)
Immature Granulocytes: 0 %
LYMPHS ABS: 3.6 10*3/uL — AB (ref 0.7–3.1)
LYMPHS: 33 %
MCH: 30.7 pg (ref 26.6–33.0)
MCHC: 32.8 g/dL (ref 31.5–35.7)
MCV: 94 fL (ref 79–97)
MONOCYTES: 6 %
Monocytes Absolute: 0.6 10*3/uL (ref 0.1–0.9)
NEUTROS ABS: 6.6 10*3/uL (ref 1.4–7.0)
Neutrophils: 60 %
PLATELETS: 221 10*3/uL (ref 150–379)
RBC: 3.91 x10E6/uL (ref 3.77–5.28)
RDW: 13.7 % (ref 12.3–15.4)
WBC: 11 10*3/uL — AB (ref 3.4–10.8)

## 2017-05-21 LAB — THYROID PANEL WITH TSH
Free Thyroxine Index: 2.3 (ref 1.2–4.9)
T3 Uptake Ratio: 26 % (ref 24–39)
T4 TOTAL: 8.9 ug/dL (ref 4.5–12.0)
TSH: 0.888 u[IU]/mL (ref 0.450–4.500)

## 2017-05-21 LAB — LIPID PANEL
CHOLESTEROL TOTAL: 146 mg/dL (ref 100–199)
Chol/HDL Ratio: 2.9 ratio (ref 0.0–4.4)
HDL: 50 mg/dL (ref >39)
LDL CALC: 60 mg/dL (ref 0–99)
TRIGLYCERIDES: 179 mg/dL — AB (ref 0–149)
VLDL CHOLESTEROL CAL: 36 mg/dL (ref 5–40)

## 2017-05-30 ENCOUNTER — Ambulatory Visit (HOSPITAL_COMMUNITY)
Admission: RE | Admit: 2017-05-30 | Discharge: 2017-05-30 | Disposition: A | Discharge status: Home / Self Care | Payer: PPO | Source: Ambulatory Visit | Attending: Nurse Practitioner | Admitting: Nurse Practitioner

## 2017-05-30 DIAGNOSIS — E041 Nontoxic single thyroid nodule: Secondary | ICD-10-CM | POA: Diagnosis not present

## 2017-05-30 DIAGNOSIS — E079 Disorder of thyroid, unspecified: Secondary | ICD-10-CM | POA: Diagnosis not present

## 2017-06-04 ENCOUNTER — Telehealth: Payer: Self-pay | Admitting: Nurse Practitioner

## 2017-06-04 NOTE — Telephone Encounter (Signed)
Discussed labs with patient and also US results. Wants to go ahead with CT scan. Sent request to PCP

## 2017-06-05 ENCOUNTER — Other Ambulatory Visit: Payer: Self-pay | Admitting: Nurse Practitioner

## 2017-06-05 DIAGNOSIS — E041 Nontoxic single thyroid nodule: Secondary | ICD-10-CM

## 2017-06-11 ENCOUNTER — Ambulatory Visit: Payer: PPO

## 2017-06-12 ENCOUNTER — Encounter: Payer: Self-pay | Admitting: Pediatrics

## 2017-06-12 ENCOUNTER — Ambulatory Visit (INDEPENDENT_AMBULATORY_CARE_PROVIDER_SITE_OTHER): Payer: PPO

## 2017-06-12 ENCOUNTER — Ambulatory Visit: Payer: PPO | Admitting: Pediatrics

## 2017-06-12 VITALS — BP 115/72 | HR 76 | Temp 97.1°F | Ht 63.5 in | Wt 156.0 lb

## 2017-06-12 DIAGNOSIS — M25512 Pain in left shoulder: Secondary | ICD-10-CM

## 2017-06-12 DIAGNOSIS — G8929 Other chronic pain: Secondary | ICD-10-CM | POA: Diagnosis not present

## 2017-06-12 DIAGNOSIS — M19012 Primary osteoarthritis, left shoulder: Secondary | ICD-10-CM | POA: Diagnosis not present

## 2017-06-12 NOTE — Progress Notes (Signed)
  Subjective:   Patient ID: Kristen Munoz, female    DOB: 1946-03-16, 71 y.o.   MRN: [id number] CC: Shoulder pain   HPI: Kristen Munoz is a 71 y.o. female presenting for shoulder pain Husband with stroke affecting one side of his body, patient is his primary caregiver Here today alone She does a lot of lifting, pushing caring for him Left shoulder has been bothering her more for the past 2-3 weeks Not able to lift up over her head without pain Has had pain in her left shoulder in the past, was treated at that time with cortisone shots with some symptomatic relief.  Last steroid shot couple years ago She would like to try a cortisone shot again  Relevant past medical, surgical, family and social history reviewed. Allergies and medications reviewed and updated. Social History   Tobacco Use  Smoking Status Current Every Day Smoker  . Packs/day: 1.00  . Years: 38.00  . Pack years: 38.00  . Types: Cigarettes  . Start date: 07/02/1974  Smokeless Tobacco Never Used   ROS: Per HPI   Objective:    BP 115/72   Pulse 76   Temp (!) 97.1 F (36.2 C) (Oral)   Ht 5' 3.5" (1.613 m)   Wt 156 lb (70.8 kg)   BMI 27.20 kg/m   Wt Readings from Last 3 Encounters:  06/12/17 156 lb (70.8 kg)  05/20/17 157 lb (71.2 kg)  12/06/16 154 lb 6.4 oz (70 kg)    Gen: NAD, alert, cooperative with exam, NCAT EYES: EOMI, no conjunctival injection, or no icterus CV: NRRR, normal S1/S2 Resp: CTABL, no wheezes, normal WOB Ext: No edema, warm Neuro: Alert and oriented Shoulder: Inspection reveals no abnormalities, atrophy or asymmetry. L shoulder Tender to palpation posterior shoulder and AC joint Rotator cuff intact Nl ROM R shoulder Able to abduct left arm apprx 90 degrees actively Up to 120 degrees passively +arm drop  Assessment & Plan:  [age] year old with shoulder pain, worsened recently with increased use.  Diagnoses and all orders for this visit:  Chronic left shoulder pain Options discussed,  will do steroid injection and refer to physical therapy -     Ambulatory referral to Physical Therapy  Acute pain of left shoulder -     DG Shoulder Left; Future  Intrarticular Shoulder Injection Verbal consent was obtained from the patient. Risks including infection explained and contrasted with benefits and alternatives. Patient prepped with betadine. An intraarticular shoulder injection was performed using the posterior approach. The patient tolerated the procedure well. No complications. Injection: 3 cc of Lidocaine 1% and Kenalog 40 mg. Needle: 25 gauge   Follow up plan: 3 months, sooner if needed Rex Krasarol Tyniya Kuyper, MD [facility]

## 2017-06-13 ENCOUNTER — Ambulatory Visit (INDEPENDENT_AMBULATORY_CARE_PROVIDER_SITE_OTHER): Payer: PPO | Admitting: *Deleted

## 2017-06-13 DIAGNOSIS — M810 Age-related osteoporosis without current pathological fracture: Secondary | ICD-10-CM | POA: Diagnosis not present

## 2017-06-13 MED ORDER — DENOSUMAB 60 MG/ML ~~LOC~~ SOLN
60.0000 mg | Freq: Once | SUBCUTANEOUS | Status: AC
  Administered 2017-06-13: 60 mg via SUBCUTANEOUS

## 2017-06-13 NOTE — Progress Notes (Signed)
Pt given Prolia inj R upper arm Tolerated well

## 2017-06-15 ENCOUNTER — Encounter: Payer: Self-pay | Admitting: Pediatrics

## 2017-06-15 MED ORDER — TRIAMCINOLONE ACETONIDE 40 MG/ML IJ SUSP: 40.0000 mg | Freq: Once | INTRAMUSCULAR | Status: DC

## 2017-06-18 ENCOUNTER — Ambulatory Visit: Payer: PPO | Admitting: Pediatrics

## 2017-06-26 ENCOUNTER — Ambulatory Visit (HOSPITAL_COMMUNITY)
Admission: RE | Admit: 2017-06-26 | Discharge: 2017-06-26 | Disposition: A | Discharge status: Home / Self Care | Payer: PPO | Source: Ambulatory Visit | Attending: Nurse Practitioner | Admitting: Nurse Practitioner

## 2017-06-26 DIAGNOSIS — E049 Nontoxic goiter, unspecified: Secondary | ICD-10-CM | POA: Insufficient documentation

## 2017-06-26 DIAGNOSIS — E041 Nontoxic single thyroid nodule: Secondary | ICD-10-CM | POA: Diagnosis not present

## 2017-06-26 MED ORDER — IOPAMIDOL (ISOVUE-300) INJECTION 61%
75.0000 mL | Freq: Once | INTRAVENOUS | Status: AC | PRN
  Administered 2017-06-26: 75 mL via INTRAVENOUS

## 2017-06-27 ENCOUNTER — Other Ambulatory Visit: Payer: Self-pay | Admitting: Nurse Practitioner

## 2017-06-27 ENCOUNTER — Ambulatory Visit: Payer: PPO | Attending: Pediatrics | Admitting: Physical Therapy

## 2017-06-27 ENCOUNTER — Encounter: Payer: Self-pay | Admitting: Physical Therapy

## 2017-06-27 DIAGNOSIS — M6281 Muscle weakness (generalized): Secondary | ICD-10-CM | POA: Diagnosis not present

## 2017-06-27 DIAGNOSIS — M25512 Pain in left shoulder: Secondary | ICD-10-CM | POA: Diagnosis not present

## 2017-06-27 DIAGNOSIS — E041 Nontoxic single thyroid nodule: Secondary | ICD-10-CM

## 2017-06-27 NOTE — Therapy (Signed)
[facility] [address] [place], [place], [address] Phone: [phone]   Fax:  [phone]  Physical Therapy Evaluation  Patient Details  Name: Kristen Munoz MRN: [id number] Date of Birth: 1946/01/14 Referring Provider: Rex Krasarol Vincent MD.   Encounter Date: 06/27/2017  PT End of Session - 06/27/17 1746    Visit Number  1    Number of Visits  12    Date for PT Re-Evaluation  08/08/17    Authorization Type  68% limitation....FOTO.    PT Start Time  0236    PT Stop Time  0324    PT Time Calculation (min)  48 min    Activity Tolerance  Patient tolerated treatment well    Behavior During Therapy  [facility] for tasks assessed/performed       Past Medical History:  Diagnosis Date  . CAD (coronary artery disease)   . GERD (gastroesophageal reflux disease)   . Hyperlipidemia   . Hypertension   . Insomnia   . Osteoporosis   . Thyroid disease   . Urinary bladder incontinence     Past Surgical History:  Procedure Laterality Date  . ABDOMINAL HYSTERECTOMY     partial  . bladder      baldder tack  . THYROID SURGERY    . VESICOVAGINAL FISTULA CLOSURE W/ TAH      There were no vitals filed for this visit.   Subjective Assessment - 06/27/17 1804    Subjective  The patient presents with increasing left shoulder pain over the last 2 months.  She reports she has to help transfer her husband and this increase her left shoulder pain.  During high episodes of pain she will experience pain all the way to her left hand.  Left shoulder movement increases her pain and heat decreases her pain.  She rates her pain at a 10/10 today.    Diagnostic tests  X-ray revealed mild degenerative changes of her left ACJ.    Patient Stated Goals  Use left arm without pain.    Currently in Pain?  Yes    Pain Score  10-Worst pain ever    Pain Location  Shoulder    Pain Orientation  Left    Pain Descriptors / Indicators  Aching;Throbbing;Shooting;Sharp    Pain  Type  Acute pain    Pain Radiating Towards  Left UE to hand.    Pain Onset  More than a month ago    Pain Frequency  Constant    Aggravating Factors   See above.    Pain Relieving Factors  See above.         [facility] - 06/27/17 0001      Assessment   Medical Diagnosis  Left shoulder pain.    Referring Provider  Rex Krasarol Vincent MD.    Onset Date/Surgical Date  -- 2 months.      Precautions   Precautions  None      Restrictions   Weight Bearing Restrictions  No      Balance Screen   Has the patient fallen in the past 6 months  No    Has the patient had a decrease in activity level because of a fear of falling?   No    Is the patient reluctant to leave their home because of a fear of falling?   No      Home Public house managernvironment   Living Environment  Private residence      Prior Function  Level of Independence  Independent      Observation/Other Assessments   Focus on Therapeutic Outcomes (FOTO)   68% limitation.      Posture/Postural Control   Posture/Postural Control  Postural limitations    Postural Limitations  Rounded Shoulders    Posture Comments  -- Protracted scapulae.      ROM / Strength   AROM / PROM / Strength  AROM;Strength      AROM   Overall AROM Comments  Active left shoulder antigravity flexion to 90 degrees and passive to 105 degrees; behind back to sacrum on left and ER= 40 degrees.      Strength   Overall Strength Comments  Left shoulder ER= 4-/5 and IR= 4/5.      Palpation   Palpation comment  Tender to palpation over muscle belly of left Infraspinatus.      Special Tests    Special Tests  Rotator Cuff Impingement Normal UE DTR's.    Rotator Cuff Impingment tests  Leanord AsalHawkins- Kennedy test;Drop Arm test      Hawkins-Kennedy test   Findings  Positive    Side  Left      Drop Arm test   Findings  Negative    Side  Left      Transfers   Comments  Independent.      Ambulation/Gait   Gait Comments  WNL.             Objective  measurements completed on examination: See above findings.      [facility] Adult PT Treatment/Exercise - 06/27/17 0001      Modalities   Modalities  Electrical Stimulation;Moist Heat      Moist Heat Therapy   Number Minutes Moist Heat  15 Minutes    Moist Heat Location  -- LEFT SHOULDER.      Programme researcher, broadcasting/film/videolectrical Stimulation   Electrical Stimulation Location  -- Left shoulder.    Electrical Stimulation Action  IFC    Electrical Stimulation Parameters  80-150 hz x 15 minutes.    Electrical Stimulation Goals  Pain             PT Education - 06/27/17 1807    Education provided  Yes    Education Details  Wall climbs.    Person(s) Educated  Patient    Methods  Explanation;Demonstration;Handout    Comprehension  Verbalized understanding;Need further instruction          PT Long Term Goals - 06/27/17 1828      PT LONG TERM GOAL #1   Title  Independent with a HEP.    Time  6    Period  Weeks    Status  New      PT LONG TERM GOAL #2   Title  Active left shoulder flexion to 145 degrees so the patient can easily reach overhead.    Time  6    Period  Weeks    Status  New      PT LONG TERM GOAL #3   Title  Active left shoulder ER to 75 degrees+ to allow for easily donning/doffing of apparel.    Time  6    Period  Weeks    Status  New      PT LONG TERM GOAL #4   Title  Increase ROM so patient is able to reach behind back to L3.    Time  6    Period  Weeks    Status  New  PT LONG TERM GOAL #5   Title  Increase left shoulder strength to a solid 4+/5 for IR/ER to increase stability for performance of functional activities.    Time  6    Period  Weeks    Status  New      Additional Long Term Goals   Additional Long Term Goals  Yes      PT LONG TERM GOAL #6   Title  Perform ADL's with pain not > 2-3/10.    Time  6    Period  Weeks    Status  New             Plan - 06-29-17 1813    Clinical Impression Statement  The patient presents to OPPT with c/o  increasing left shoulder pain over the last 2 months likely related to transferring her husband who has had a stroke.  Her left shoulder has very significant losses of range of motion as well as a mild loss of strength especially into ER.  She demonstrates a positive left shoulder Impingement test.  Her pain and deficits limit her ability to perform ADL's wiht her left UE.  Patient will benefit from skilled physical therapy to address the aforementioned deficits.    History and Personal Factors relevant to plan of care:  Thyroid problems and OP.    Clinical Presentation  Evolving    Clinical Presentation due to:  Pain is noy getting better even after an injection.    Clinical Decision Making  Low    Rehab Potential  Excellent    PT Frequency  2x / week    PT Duration  6 weeks    PT Treatment/Interventions  ADLs/Self Care Home Management;Cryotherapy;Electrical Stimulation;Ultrasound;Moist Heat;Therapeutic activities;Therapeutic exercise;Patient/family education;Manual techniques;Passive range of motion;Dry needling    PT Next Visit Plan  Wall climb with thumb pointed toward patient; pulleys with palm-up; UE Ranger; PROM and left shoulder capsular strecthing; Combo e'stim/U/S and STW/M to left Infraspinatus.  Then progress with PRE's beginning with yellow theraband RW4 and [facility] ER.  Other modalities and dry needling PRN.    Consulted and Agree with Plan of Care  Patient       Patient will benefit from skilled therapeutic intervention in order to improve the following deficits and impairments:  Decreased activity tolerance, Decreased range of motion, Decreased strength, Postural dysfunction, Pain  Visit Diagnosis: Acute pain of left shoulder - Plan: PT plan of care cert/re-cert  Muscle weakness (generalized) - Plan: PT plan of care cert/re-cert  G-Codes - 06-29-17 1746    Functional Assessment Tool Used (Outpatient Only)  FOTO.....68% limitation.        Problem List Patient Active Problem  List   Diagnosis Date Noted  . GAD (generalized anxiety disorder) 10/11/2014  . Insomnia 09/17/2014  . Pain in joint, shoulder region 01/21/2013  . Hypothyroid 10/29/2012  . Hyperlipidemia 10/29/2012  . Depression 10/29/2012  . Stress incontinence, female 10/29/2012  . CAD (coronary artery disease) 10/29/2012  . Essential hypertension, benign 10/29/2012  . Osteoporosis 10/29/2012  . GERD (gastroesophageal reflux disease) 10/29/2012    Arayah Krouse, [place] MPT 06/29/17, 6:33 PM  [facility] [address] [place], [place], [address] Phone: [phone]   Fax:  [phone]  Name: Kristen Munoz MRN: [id number] Date of Birth: 1945-08-03

## 2017-07-03 ENCOUNTER — Other Ambulatory Visit: Payer: Self-pay

## 2017-07-03 DIAGNOSIS — E041 Nontoxic single thyroid nodule: Secondary | ICD-10-CM

## 2017-07-03 NOTE — Progress Notes (Unsigned)
[id number]

## 2017-07-04 ENCOUNTER — Ambulatory Visit: Payer: PPO | Attending: Pediatrics | Admitting: Physical Therapy

## 2017-07-04 ENCOUNTER — Encounter: Payer: Self-pay | Admitting: Physical Therapy

## 2017-07-04 DIAGNOSIS — M6281 Muscle weakness (generalized): Secondary | ICD-10-CM | POA: Diagnosis not present

## 2017-07-04 DIAGNOSIS — M25512 Pain in left shoulder: Secondary | ICD-10-CM | POA: Diagnosis not present

## 2017-07-04 NOTE — Therapy (Addendum)
[facility] [place], [place], [address] Phone: [phone]   Fax:  [phone]  Physical Therapy Treatment  Patient Details  Name: Kristen Munoz MRN: [id number] Date of Birth: [birth year] Referring Provider: Assunta Found MD.   Encounter Date: 07/04/2017  PT End of Session - 07/04/17 1744    Visit Number  2    Number of Visits  12    Date for PT Re-Evaluation  08/08/17    Authorization Type  68% limitation....FOTO.    PT Start Time  0445    PT Stop Time  0539    PT Time Calculation (min)  54 min    Activity Tolerance  Patient tolerated treatment well    Behavior During Therapy  [facility] for tasks assessed/performed       Past Medical History:  Diagnosis Date  . CAD (coronary artery disease)   . GERD (gastroesophageal reflux disease)   . Hyperlipidemia   . Hypertension   . Insomnia   . Osteoporosis   . Thyroid disease   . Urinary bladder incontinence     Past Surgical History:  Procedure Laterality Date  . ABDOMINAL HYSTERECTOMY     partial  . bladder      baldder tack  . THYROID SURGERY    . VESICOVAGINAL FISTULA CLOSURE W/ TAH      There were no vitals filed for this visit.  Subjective Assessment - 07/04/17 1747    Subjective  I've been doing that exercise.    Diagnostic tests  X-ray revealed mild degenerative changes of her left ACJ.    Patient Stated Goals  Use left arm without pain.    Pain Score  5     Pain Location  Shoulder    Pain Orientation  Left    Pain Descriptors / Indicators  Aching;Throbbing    Pain Type  Acute pain    Pain Onset  More than a month ago                      [facility] Adult PT Treatment/Exercise - 07/04/17 0001      Modalities   Modalities  Ultrasound      Moist Heat Therapy   Number Minutes Moist Heat  20 Minutes    Moist Heat Location  -- Left shoulder.      Acupuncturist Location  -- Left shoulder.    Electrical  Stimulation Action  IFC    Electrical Stimulation Parameters  80-150 hz x 20 minutes.    Electrical Stimulation Goals  Pain      Ultrasound   Ultrasound Location  -- Left shoulder.    Ultrasound Parameters  Combo e'stim/U/S at 1.50 W/CM2 x 12 minutes.    Ultrasound Goals  Pain      Manual Therapy   Manual Therapy  Soft tissue mobilization    Manual therapy comments  STW/M to left infrapinatus; anterior shoulder and UT x 12 minutes.                  PT Long Term Goals - 06/27/17 1828      PT LONG TERM GOAL #1   Title  Independent with a HEP.    Time  6    Period  Weeks    Status  New      PT LONG TERM GOAL #2   Title  Active left shoulder flexion to 145 degrees so the patient can easily  reach overhead.    Time  6    Period  Weeks    Status  New      PT LONG TERM GOAL #3   Title  Active left shoulder ER to 75 degrees+ to allow for easily donning/doffing of apparel.    Time  6    Period  Weeks    Status  New      PT LONG TERM GOAL #4   Title  Increase ROM so patient is able to reach behind back to L3.    Time  6    Period  Weeks    Status  New      PT LONG TERM GOAL #5   Title  Increase left shoulder strength to a solid 4+/5 for IR/ER to increase stability for performance of functional activities.    Time  6    Period  Weeks    Status  New      Additional Long Term Goals   Additional Long Term Goals  Yes      PT LONG TERM GOAL #6   Title  Perform ADL's with pain not > 2-3/10.    Time  6    Period  Weeks    Status  New            Plan - 07/04/17 1750    Clinical Impression Statement  Very good response to treatment with patient stating her shoulder flet better after treatment.    PT Treatment/Interventions  ADLs/Self Care Home Management;Cryotherapy;Electrical Stimulation;Ultrasound;Moist Heat;Therapeutic activities;Therapeutic exercise;Patient/family education;Manual techniques;Passive range of motion;Dry needling    PT Next Visit Plan  Wall  climb with thumb pointed toward patient; pulleys with palm-up; UE Ranger; PROM and left shoulder capsular strecthing; Combo e'stim/U/S and STW/M to left Infraspinatus.  Then progress with PRE's beginning with yellow theraband RW4 and [facility] ER.  Other modalities and dry needling PRN.    Consulted and Agree with Plan of Care  Patient       Patient will benefit from skilled therapeutic intervention in order to improve the following deficits and impairments:  Decreased activity tolerance, Decreased range of motion, Decreased strength, Postural dysfunction, Pain  Visit Diagnosis: Acute pain of left shoulder  Muscle weakness (generalized)     Problem List Patient Active Problem List   Diagnosis Date Noted  . GAD (generalized anxiety disorder) 10/11/2014  . Insomnia 09/17/2014  . Pain in joint, shoulder region 01/21/2013  . Hypothyroid 10/29/2012  . Hyperlipidemia 10/29/2012  . Depression 10/29/2012  . Stress incontinence, female 10/29/2012  . CAD (coronary artery disease) 10/29/2012  . Essential hypertension, benign 10/29/2012  . Osteoporosis 10/29/2012  . GERD (gastroesophageal reflux disease) 10/29/2012    Kristen Munoz, [place] MPT 07/04/2017, 5:51 PM  [facility] [place], [place], [address] Phone: [phone]   Fax:  [phone]  Name: Kristen Munoz MRN: [id number] Date of Birth: 11/19/1945  PHYSICAL THERAPY DISCHARGE SUMMARY  Visits from Start of Care: 2.  Current functional level related to goals / functional outcomes: See above.   Remaining deficits: See below.   Education / Equipment:  Plan: Patient agrees to discharge.  Patient goals were not met. Patient is being discharged due to not returning since the last visit.  ?????         [place] Nareh Matzke MPT

## 2017-07-20 ENCOUNTER — Other Ambulatory Visit: Payer: Self-pay | Admitting: Nurse Practitioner

## 2017-07-20 DIAGNOSIS — E782 Mixed hyperlipidemia: Secondary | ICD-10-CM

## 2017-07-24 ENCOUNTER — Other Ambulatory Visit (HOSPITAL_COMMUNITY)
Admission: RE | Admit: 2017-07-24 | Discharge: 2017-07-24 | Disposition: A | Discharge status: Home / Self Care | Payer: PPO | Source: Ambulatory Visit | Attending: Physician Assistant | Admitting: Physician Assistant

## 2017-07-24 ENCOUNTER — Ambulatory Visit
Admission: RE | Admit: 2017-07-24 | Discharge: 2017-07-24 | Disposition: A | Discharge status: Home / Self Care | Payer: PPO | Source: Ambulatory Visit | Attending: Nurse Practitioner | Admitting: Nurse Practitioner

## 2017-07-24 DIAGNOSIS — E049 Nontoxic goiter, unspecified: Secondary | ICD-10-CM | POA: Insufficient documentation

## 2017-07-24 DIAGNOSIS — E041 Nontoxic single thyroid nodule: Secondary | ICD-10-CM | POA: Diagnosis not present

## 2017-07-24 NOTE — Procedures (Signed)
PROCEDURE SUMMARY:  Using direct ultrasound guidance, 4 passes were made using 25 g needles into the left lobe of the thyroid.   Ultrasound was used to confirm needle placements on all occasions.   Specimens were sent to Pathology for analysis.  See procedure note under Imaging tab in Epic for full procedure details.  Gwynneth MacleodWENDY S Morna Flud PA-C 07/24/2017 4:39 PM

## 2017-07-30 ENCOUNTER — Telehealth: Payer: Self-pay | Admitting: Nurse Practitioner

## 2017-07-30 NOTE — Telephone Encounter (Signed)
dont see results in computer yet

## 2017-07-30 NOTE — Telephone Encounter (Signed)
Pt  Aware  bx results are negative.  Pt wondering if she should be doing anything different?

## 2017-07-30 NOTE — Telephone Encounter (Signed)
Pt aware there is nothing to do different

## 2017-07-30 NOTE — Telephone Encounter (Signed)
No nothing to be done

## 2017-10-14 ENCOUNTER — Ambulatory Visit: Payer: PPO | Admitting: *Deleted

## 2017-10-30 ENCOUNTER — Other Ambulatory Visit: Payer: Self-pay | Admitting: Nurse Practitioner

## 2017-10-30 DIAGNOSIS — F411 Generalized anxiety disorder: Secondary | ICD-10-CM

## 2017-10-30 DIAGNOSIS — E782 Mixed hyperlipidemia: Secondary | ICD-10-CM

## 2017-10-30 NOTE — Telephone Encounter (Signed)
Last seen 06/12/17  MMM

## 2017-11-28 ENCOUNTER — Other Ambulatory Visit: Payer: Self-pay | Admitting: Nurse Practitioner

## 2018-01-10 ENCOUNTER — Other Ambulatory Visit: Payer: Self-pay | Admitting: Nurse Practitioner

## 2018-01-10 DIAGNOSIS — F411 Generalized anxiety disorder: Secondary | ICD-10-CM

## 2018-01-10 DIAGNOSIS — K219 Gastro-esophageal reflux disease without esophagitis: Secondary | ICD-10-CM

## 2018-01-13 NOTE — Telephone Encounter (Signed)
Last seen 06/20/17  MMM

## 2018-02-06 ENCOUNTER — Other Ambulatory Visit: Payer: Self-pay | Admitting: Nurse Practitioner

## 2018-02-06 DIAGNOSIS — E782 Mixed hyperlipidemia: Secondary | ICD-10-CM

## 2018-02-07 NOTE — Telephone Encounter (Signed)
Last lipid 05/20/17

## 2018-04-14 ENCOUNTER — Telehealth: Payer: Self-pay | Admitting: Nurse Practitioner

## 2018-04-16 NOTE — Telephone Encounter (Signed)
That should be ok   Engineer, manufacturing systems submitted online through [employer] portal and patient aware.

## 2018-05-09 ENCOUNTER — Encounter: Payer: Self-pay | Admitting: Nurse Practitioner

## 2018-05-09 ENCOUNTER — Ambulatory Visit: Payer: PPO | Admitting: Nurse Practitioner

## 2018-05-09 VITALS — BP 126/73 | HR 83 | Temp 97.3°F | Ht 63.0 in | Wt 145.0 lb

## 2018-05-09 DIAGNOSIS — Z23 Encounter for immunization: Secondary | ICD-10-CM | POA: Diagnosis not present

## 2018-05-09 DIAGNOSIS — F5101 Primary insomnia: Secondary | ICD-10-CM | POA: Diagnosis not present

## 2018-05-09 DIAGNOSIS — D509 Iron deficiency anemia, unspecified: Secondary | ICD-10-CM | POA: Diagnosis not present

## 2018-05-09 DIAGNOSIS — M810 Age-related osteoporosis without current pathological fracture: Secondary | ICD-10-CM | POA: Diagnosis not present

## 2018-05-09 DIAGNOSIS — F3342 Major depressive disorder, recurrent, in full remission: Secondary | ICD-10-CM | POA: Diagnosis not present

## 2018-05-09 DIAGNOSIS — F411 Generalized anxiety disorder: Secondary | ICD-10-CM | POA: Diagnosis not present

## 2018-05-09 DIAGNOSIS — I2510 Atherosclerotic heart disease of native coronary artery without angina pectoris: Secondary | ICD-10-CM | POA: Diagnosis not present

## 2018-05-09 DIAGNOSIS — E039 Hypothyroidism, unspecified: Secondary | ICD-10-CM

## 2018-05-09 DIAGNOSIS — E782 Mixed hyperlipidemia: Secondary | ICD-10-CM | POA: Diagnosis not present

## 2018-05-09 DIAGNOSIS — I10 Essential (primary) hypertension: Secondary | ICD-10-CM

## 2018-05-09 DIAGNOSIS — K219 Gastro-esophageal reflux disease without esophagitis: Secondary | ICD-10-CM

## 2018-05-09 MED ORDER — PANTOPRAZOLE SODIUM 40 MG PO TBEC: 40.0000 mg | DELAYED_RELEASE_TABLET | Freq: Every day | ORAL | 1 refills | Status: DC

## 2018-05-09 MED ORDER — METOPROLOL TARTRATE 25 MG PO TABS: 12.5000 mg | ORAL_TABLET | Freq: Two times a day (BID) | ORAL | 1 refills | Status: DC

## 2018-05-09 MED ORDER — LISINOPRIL-HYDROCHLOROTHIAZIDE 10-12.5 MG PO TABS: 1.0000 | ORAL_TABLET | Freq: Every day | ORAL | 1 refills | Status: DC

## 2018-05-09 MED ORDER — LEVOTHYROXINE SODIUM 25 MCG PO TABS: ORAL_TABLET | ORAL | 1 refills | Status: DC

## 2018-05-09 MED ORDER — FERROUS SULFATE 325 (65 FE) MG PO TABS: 325.0000 mg | ORAL_TABLET | Freq: Every day | ORAL | 1 refills | Status: DC

## 2018-05-09 MED ORDER — SIMVASTATIN 40 MG PO TABS: 40.0000 mg | ORAL_TABLET | Freq: Every day | ORAL | 1 refills | Status: DC

## 2018-05-09 MED ORDER — DENOSUMAB 60 MG/ML ~~LOC~~ SOSY
60.0000 mg | PREFILLED_SYRINGE | Freq: Once | SUBCUTANEOUS | Status: AC
  Administered 2018-05-09: 60 mg via SUBCUTANEOUS

## 2018-05-09 MED ORDER — ESCITALOPRAM OXALATE 20 MG PO TABS: 20.0000 mg | ORAL_TABLET | Freq: Every day | ORAL | 1 refills | Status: DC

## 2018-05-09 NOTE — Progress Notes (Signed)
Subjective:    Patient ID: Kristen Munoz, female    DOB: July 01, 1946, 72 y.o.   MRN: [id number]   Chief Complaint: Medical management of chronic issues  HPI:  1. Coronary artery disease involving native coronary artery of native heart without angina pectoris saw cardiology in 2018. Was doing well. Was told to follow up in 1 year.  2. Essential hypertension, benign  No c/o chest pain, sob or headache. Does not check blood pressure at home. BP Readings from Last 3 Encounters:  06/12/17 115/72  05/20/17 111/70  12/06/16 119/67     3. Gastroesophageal reflux disease, esophagitis presence not specified  Is on protonix daily. Works well to keep symptoms under control.  4. Hypothyroidism, unspecified type  No problems that she is aware of.  5. Age-related osteoporosis without current pathological fracture  Is on prolia and actually has an injection scheduled for today.  6. Mixed hyperlipidemia  Doe sot really watch diet and does very little exercise  7. Recurrent major depressive disorder, in full remission ([place])  Is on a combination of lexapro and klonopin. Keeps her calm. Depression screen [facility] 2/9 06/12/2017 05/20/2017 10/10/2016  Decreased Interest '1 1 3  ' Down, Depressed, Hopeless '1 1 3  ' PHQ - 2 Score '2 2 6  ' Altered sleeping 0 1 1  Tired, decreased energy '1 1 2  ' Change in appetite 0 0 0  Feeling bad or failure about yourself  '1 1 2  ' Trouble concentrating 0 1 1  Moving slowly or fidgety/restless 1 0 0  Suicidal thoughts 0 0 0  PHQ-9 Score '5 6 12  ' Difficult doing work/chores Somewhat difficult Somewhat difficult -  Some recent data might be hidden     8. Primary insomnia  Usually takes klonopin near bedtime so can sleep better.  9. GAD (generalized anxiety disorder)  Again is on klonopin and works well to keep her calm.  10.    BMI 25.0-25.9          She has lost 11lbs since last visit.    Outpatient Encounter Medications as of 05/09/2018  Medication Sig  . aspirin EC  81 MG tablet Take 81 mg by mouth daily.  . clonazePAM (KLONOPIN) 0.5 MG tablet TAKE 1 TABLET BY MOUTH TWICE DAILY AS NEEDED  . denosumab (PROLIA) 60 MG/ML SOLN injection Inject 60 mg into the skin every 6 (six) months. Administer in upper arm, thigh, or abdomen  . escitalopram (LEXAPRO) 20 MG tablet TAKE 1 TABLET BY MOUTH ONCE DAILY  . ferrous sulfate 325 (65 FE) MG tablet Take 1 tablet (325 mg total) daily by mouth.  . levothyroxine (SYNTHROID, LEVOTHROID) 25 MCG tablet TAKE ONE TABLET BY MOUTH ONCE DAILY BEFORE BREAKFAST  . lisinopril-hydrochlorothiazide (PRINZIDE,ZESTORETIC) 10-12.5 MG tablet Take 1 tablet daily by mouth.  . metoprolol tartrate (LOPRESSOR) 25 MG tablet Take 0.5 tablets (12.5 mg total) 2 (two) times daily by mouth.  . pantoprazole (PROTONIX) 40 MG tablet TAKE 1 TABLET BY MOUTH ONCE DAILY  . simvastatin (ZOCOR) 40 MG tablet TAKE 1 TABLET BY MOUTH ONCE DAILY  . Vitamin D, Ergocalciferol, (DRISDOL) 50000 units CAPS capsule Take 1 capsule (50,000 Units total) by mouth every 7 (seven) days.     New complaints: None today  Social history: Her daughter died in Mar 19, 2023. She had a massive heart attack in front of her and died.   Review of Systems  Constitutional: Negative for activity change and appetite change.  HENT: Negative.   Eyes: Negative for  pain.  Respiratory: Negative for shortness of breath.   Cardiovascular: Negative for chest pain, palpitations and leg swelling.  Gastrointestinal: Negative for abdominal pain.  Endocrine: Negative for polydipsia.  Genitourinary: Negative.   Skin: Negative for rash.  Neurological: Negative for dizziness, weakness and headaches.  Hematological: Does not bruise/bleed easily.  Psychiatric/Behavioral: Negative.   All other systems reviewed and are negative.      Objective:   Physical Exam  Constitutional: She is oriented to person, place, and time. She appears well-developed and well-nourished. No distress.  HENT:  Head:  Normocephalic.  Nose: Nose normal.  Mouth/Throat: Oropharynx is clear and moist.  Eyes: Pupils are equal, round, and reactive to light. EOM are normal.  Neck: Normal range of motion. Neck supple. No JVD present. Carotid bruit is not present.  Cardiovascular: Normal rate, regular rhythm, normal heart sounds and intact distal pulses.  Pulmonary/Chest: Effort normal and breath sounds normal. No respiratory distress. She has no wheezes. She has no rales. She exhibits no tenderness.  Abdominal: Soft. Normal appearance, normal aorta and bowel sounds are normal. She exhibits no distension, no abdominal bruit, no pulsatile midline mass and no mass. There is no splenomegaly or hepatomegaly. There is no tenderness.  Musculoskeletal: Normal range of motion. She exhibits no edema.  Lymphadenopathy:    She has no cervical adenopathy.  Neurological: She is alert and oriented to person, place, and time. She has normal reflexes.  Skin: Skin is warm and dry.  Psychiatric: She has a normal mood and affect. Her behavior is normal. Judgment and thought content normal.  Nursing note and vitals reviewed.   BP 126/73   Pulse 83   Temp (!) 97.3 F (36.3 C) (Oral)   Ht '5\' 3"'  (1.6 m)   Wt 145 lb (65.8 kg)   BMI 25.69 kg/m      Assessment & Plan:  Kristen Munoz comes in today with chief complaint of Medical Management of Chronic Issues (3 month ); Hyperlipidemia; Hypothyroidism; and Hypertension   Diagnosis and orders addressed:  1. Coronary artery disease involving native coronary artery of native heart without angina pectoris See cardiology for follow up  2. Essential hypertension, benign Low sodium diet - metoprolol tartrate (LOPRESSOR) 25 MG tablet; Take 0.5 tablets (12.5 mg total) by mouth 2 (two) times daily.  Dispense: 90 tablet; Refill: 1 - lisinopril-hydrochlorothiazide (PRINZIDE,ZESTORETIC) 10-12.5 MG tablet; Take 1 tablet by mouth daily.  Dispense: 90 tablet; Refill: 1 - CMP14+EGFR  3.  Gastroesophageal reflux disease, esophagitis presence not specified Avoid spicy foods Do not eat 2 hours prior to bedtime - pantoprazole (PROTONIX) 40 MG tablet; Take 1 tablet (40 mg total) by mouth daily.  Dispense: 90 tablet; Refill: 1  4. Age-related osteoporosis without current pathological fracture Weight bearing exercises encouraged  5. Mixed hyperlipidemia Low fat diet - simvastatin (ZOCOR) 40 MG tablet; Take 1 tablet (40 mg total) by mouth daily.  Dispense: 90 tablet; Refill: 1 - Lipid panel  6. Recurrent major depressive disorder, in full remission ([place]) Stress management Grief counseling encouraged - escitalopram (LEXAPRO) 20 MG tablet; Take 1 tablet (20 mg total) by mouth daily.  Dispense: 90 tablet; Refill: 1  7. Primary insomnia Bedtime routine  8. GAD (generalized anxiety disorder) Stress manegement  9. Hypothyroidism - levothyroxine (SYNTHROID, LEVOTHROID) 25 MCG tablet; TAKE ONE TABLET BY MOUTH ONCE DAILY BEFORE BREAKFAST  Dispense: 90 tablet; Refill: 1 - Thyroid Panel With TSH  10. Iron deficiency anemia, unspecified iron deficiency anemia type - ferrous  sulfate 325 (65 FE) MG tablet; Take 1 tablet (325 mg total) by mouth daily.  Dispense: 90 tablet; Refill: 1   Labs pending Health Maintenance reviewed Diet and exercise encouraged  Follow up plan: 3 months   Mary-Margaret Hassell Done, FNP

## 2018-05-09 NOTE — Addendum Note (Signed)
Addended by: Tamera Punt on: 05/09/2018 04:22 PM   Modules accepted: Orders

## 2018-05-09 NOTE — Patient Instructions (Signed)
Bone Health Bones protect organs, store calcium, and anchor muscles. Good health habits, such as eating nutritious foods and exercising regularly, are important for maintaining healthy bones. They can also help to prevent a condition that causes bones to lose density and become weak and brittle (osteoporosis). Why is bone mass important? Bone mass refers to the amount of bone tissue that you have. The higher your bone mass, the stronger your bones. An important step toward having healthy bones throughout life is to have strong and dense bones during childhood. A young adult who has a high bone mass is more likely to have a high bone mass later in life. Bone mass at its greatest it is called peak bone mass. A large decline in bone mass occurs in older adults. In women, it occurs about the time of menopause. During this time, it is important to practice good health habits, because if more bone is lost than what is replaced, the bones will become less healthy and more likely to break (fracture). If you find that you have a low bone mass, you may be able to prevent osteoporosis or further bone loss by changing your diet and lifestyle. How can I find out if my bone mass is low? Bone mass can be measured with an X-ray test that is called a bone mineral density (BMD) test. This test is recommended for all women who are age 65 or older. It may also be recommended for men who are age 70 or older, or for people who are more likely to develop osteoporosis due to:  Having bones that break easily.  Having a long-term disease that weakens bones, such as kidney disease or rheumatoid arthritis.  Having menopause earlier than normal.  Taking medicine that weakens bones, such as steroids, thyroid hormones, or hormone treatment for breast cancer or prostate cancer.  Smoking.  Drinking three or more alcoholic drinks each day.  What are the nutritional recommendations for healthy bones? To have healthy bones, you  need to get enough of the right minerals and vitamins. Most nutrition experts recommend getting these nutrients from the foods that you eat. Nutritional recommendations vary from person to person. Ask your health care provider what is healthy for you. Here are some general guidelines. Calcium Recommendations Calcium is the most important (essential) mineral for bone health. Most people can get enough calcium from their diet, but supplements may be recommended for people who are at risk for osteoporosis. Good sources of calcium include:  Dairy products, such as low-fat or nonfat milk, cheese, and yogurt.  Dark green leafy vegetables, such as bok choy and broccoli.  Calcium-fortified foods, such as orange juice, cereal, bread, soy beverages, and tofu products.  Nuts, such as almonds.  Follow these recommended amounts for daily calcium intake:  Children, age 1?3: 700 mg.  Children, age 4?8: 1,000 mg.  Children, age 9?13: 1,300 mg.  Teens, age 14?18: 1,300 mg.  Adults, age 19?50: 1,000 mg.  Adults, age 51?70: ? Men: 1,000 mg. ? Women: 1,200 mg.  Adults, age 71 or older: 1,200 mg.  Pregnant and breastfeeding females: ? Teens: 1,300 mg. ? Adults: 1,000 mg.  Vitamin D Recommendations Vitamin D is the most essential vitamin for bone health. It helps the body to absorb calcium. Sunlight stimulates the skin to make vitamin D, so be sure to get enough sunlight. If you live in a cold climate or you do not get outside often, your health care provider may recommend that you take vitamin   D supplements. Good sources of vitamin D in your diet include:  Egg yolks.  Saltwater fish.  Milk and cereal fortified with vitamin D.  Follow these recommended amounts for daily vitamin D intake:  Children and teens, age 1?18: 600 international units.  Adults, age 50 or younger: 400-800 international units.  Adults, age 51 or older: 800-1,000 international units.  Other Nutrients Other nutrients  for bone health include:  Phosphorus. This mineral is found in meat, poultry, dairy foods, nuts, and legumes. The recommended daily intake for adult men and adult women is 700 mg.  Magnesium. This mineral is found in seeds, nuts, dark green vegetables, and legumes. The recommended daily intake for adult men is 400?420 mg. For adult women, it is 310?320 mg.  Vitamin K. This vitamin is found in green leafy vegetables. The recommended daily intake is 120 mg for adult men and 90 mg for adult women.  What type of physical activity is best for building and maintaining healthy bones? Weight-bearing and strength-building activities are important for building and maintaining peak bone mass. Weight-bearing activities cause muscles and bones to work against gravity. Strength-building activities increases muscle strength that supports bones. Weight-bearing and muscle-building activities include:  Walking and hiking.  Jogging and running.  Dancing.  Gym exercises.  Lifting weights.  Tennis and racquetball.  Climbing stairs.  Aerobics.  Adults should get at least 30 minutes of moderate physical activity on most days. Children should get at least 60 minutes of moderate physical activity on most days. Ask your health care provide what type of exercise is best for you. Where can I find more information? For more information, check out the following websites:  National Osteoporosis Foundation: http://nof.org/learn/basics  National Institutes of Health: http://www.niams.nih.gov/Health_Info/Bone/Bone_Health/bone_health_for_life.asp  This information is not intended to replace advice given to you by your health care provider. Make sure you discuss any questions you have with your health care provider. Document Released: 09/08/2003 Document Revised: 01/06/2016 Document Reviewed: 06/23/2014 Elsevier Interactive Patient Education  2018 Elsevier Inc.  

## 2018-05-10 LAB — CMP14+EGFR
A/G RATIO: 1.5 (ref 1.2–2.2)
ALBUMIN: 4.2 g/dL (ref 3.5–4.8)
ALT: 11 IU/L (ref 0–32)
AST: 10 IU/L (ref 0–40)
Alkaline Phosphatase: 101 IU/L (ref 39–117)
BUN/Creatinine Ratio: 17 (ref 12–28)
BUN: 20 mg/dL (ref 8–27)
Bilirubin Total: 0.2 mg/dL (ref 0.0–1.2)
CALCIUM: 9.2 mg/dL (ref 8.7–10.3)
CO2: 25 mmol/L (ref 20–29)
Chloride: 99 mmol/L (ref 96–106)
Creatinine, Ser: 1.19 mg/dL — ABNORMAL HIGH (ref 0.57–1.00)
GFR calc Af Amer: 53 mL/min/{1.73_m2} — ABNORMAL LOW (ref >59)
GFR, EST NON AFRICAN AMERICAN: 46 mL/min/{1.73_m2} — AB (ref >59)
GLOBULIN, TOTAL: 2.8 g/dL (ref 1.5–4.5)
Glucose: 111 mg/dL — ABNORMAL HIGH (ref 65–99)
Potassium: 3.7 mmol/L (ref 3.5–5.2)
SODIUM: 138 mmol/L (ref 134–144)
Total Protein: 7 g/dL (ref 6.0–8.5)

## 2018-05-10 LAB — THYROID PANEL WITH TSH
Free Thyroxine Index: 2.6 (ref 1.2–4.9)
T3 UPTAKE RATIO: 29 % (ref 24–39)
T4, Total: 9.1 ug/dL (ref 4.5–12.0)
TSH: 0.88 u[IU]/mL (ref 0.450–4.500)

## 2018-05-10 LAB — LIPID PANEL
CHOL/HDL RATIO: 2.9 ratio (ref 0.0–4.4)
Cholesterol, Total: 137 mg/dL (ref 100–199)
HDL: 47 mg/dL (ref >39)
LDL CALC: 55 mg/dL (ref 0–99)
TRIGLYCERIDES: 176 mg/dL — AB (ref 0–149)
VLDL CHOLESTEROL CAL: 35 mg/dL (ref 5–40)

## 2018-06-29 ENCOUNTER — Other Ambulatory Visit: Payer: Self-pay | Admitting: Nurse Practitioner

## 2018-06-29 DIAGNOSIS — F411 Generalized anxiety disorder: Secondary | ICD-10-CM

## 2018-06-30 NOTE — Telephone Encounter (Signed)
Last seen 07/19/17  MMM 

## 2018-10-22 ENCOUNTER — Encounter: Payer: Self-pay | Admitting: Nurse Practitioner

## 2018-10-22 ENCOUNTER — Other Ambulatory Visit: Payer: Self-pay

## 2018-10-22 ENCOUNTER — Ambulatory Visit (INDEPENDENT_AMBULATORY_CARE_PROVIDER_SITE_OTHER): Payer: PPO | Admitting: Nurse Practitioner

## 2018-10-22 DIAGNOSIS — M62830 Muscle spasm of back: Secondary | ICD-10-CM | POA: Diagnosis not present

## 2018-10-22 MED ORDER — METHOCARBAMOL 500 MG PO TABS: 500.0000 mg | ORAL_TABLET | Freq: Four times a day (QID) | ORAL | 3 refills | Status: DC

## 2018-10-22 MED ORDER — MELOXICAM 15 MG PO TABS: 15.0000 mg | ORAL_TABLET | Freq: Every day | ORAL | 0 refills | Status: DC

## 2018-10-22 NOTE — Progress Notes (Signed)
Patient ID: Kristen Munoz, female   DOB: 13-Jun-1946, 73 y.o.   MRN: [id number]     Virtual Visit via telephone Note  I connected with [name] on 10/22/18 at 2:50 PM by telephone and verified that I am speaking with the correct person using two identifiers. Kristen Munoz is currently located at home and her husband is currently with her during visit. The provider, Mary-Margaret Daphine Deutscher, FNP is located in their office at time of visit.  I discussed the limitations, risks, security and privacy concerns of performing an evaluation and management service by telephone and the availability of in person appointments. I also discussed with the patient that there may be a patient responsible charge related to this service. The patient expressed understanding and agreed to proceed.   History and Present Illness:    Chief Complaint: muscle spasms  HPI Patient has been having muscles spasms in her back- it is like a grabbing pain. Pain was intermittent and now it is constant. Pain is in the lower back by tail bone and pain does not radiate down legs. Rates pain 8/10. Activity makes worse and nothing really helps it. Ibuprofen is not helping. 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   Respiratory: Negative.   Cardiovascular: Negative.   Genitourinary: Negative.   Musculoskeletal: Positive for back pain.  All other systems reviewed and are negative.    Observations/Objective: Alert and oriented- answers all questions appropriately No distress noted  Assessment and Plan: Kristen Munoz in today with chief complaint of Spasms   1. Back spasm Moist heat Try to avoid lifting Good body mechanics when lifting - meloxicam (MOBIC) 15 MG tablet; Take 1 tablet (15 mg total) by mouth daily.  Dispense: 30 tablet; Refill: 0 - methocarbamol (ROBAXIN) 500 MG tablet; Take 1 tablet (500 mg total) by mouth 4 (four) times daily.  Dispense: 60  tablet; Refill: 3   Follow Up Instructions: prn    I discussed the assessment and treatment plan with the patient. The patient was provided an opportunity to ask questions and all were answered. The patient agreed with the plan and demonstrated an understanding of the instructions.   The patient was advised to call back or seek an in-person evaluation if the symptoms worsen or if the condition fails to improve as anticipated.  The above assessment and management plan was discussed with the patient. The patient verbalized understanding of and has agreed to the management plan. Patient is aware to call the clinic if symptoms persist or worsen. Patient is aware when to return to the clinic for a follow-up visit. Patient educated on when it is appropriate to go to the emergency department.    I provided 15 minutes of non-face-to-face time during this encounter.    Mary-Margaret Daphine Deutscher, FNP

## 2018-10-24 ENCOUNTER — Telehealth: Payer: Self-pay | Admitting: *Deleted

## 2018-10-24 NOTE — Telephone Encounter (Signed)
PROLIA: Summary of Benefits Interpretation  October 24, 2018     Purchase Information  Last purchase location: Buy and [employer] . Primary: [employer]  . Secondary: n/a   Summary of Benefits  . Received on: 10/10/2018 . Estimated Cost to Patient: $250 . Prior authorization required: No   . Physician Purchase Covered: Yes  . Specialty Pharmacy Covered:No    Phone call to patient to advise about cost of Prolia injection and schedule for around 11/08/2018. No answer, no voicemail.  Will attempt to reach patient again next week.  Lindie Spruce, AMY M   10/24/2018 [facility] [phone]

## 2018-10-28 ENCOUNTER — Encounter: Payer: Self-pay | Admitting: *Deleted

## 2018-10-28 NOTE — Progress Notes (Signed)
Spoke with patient- advised her she is due for Prolia 11/08/2018.  She states she will call and let me know what day she is coming in for injection, but cannot come in on 11/08/2018.  Advised her that her portion of the cost for injection is about $250, patient agreeable.

## 2018-11-14 ENCOUNTER — Other Ambulatory Visit: Payer: Self-pay | Admitting: Pediatrics

## 2018-11-14 DIAGNOSIS — F411 Generalized anxiety disorder: Secondary | ICD-10-CM

## 2018-11-25 ENCOUNTER — Other Ambulatory Visit: Payer: Self-pay | Admitting: Pediatrics

## 2018-11-25 ENCOUNTER — Other Ambulatory Visit: Payer: Self-pay

## 2018-11-25 DIAGNOSIS — F411 Generalized anxiety disorder: Secondary | ICD-10-CM

## 2018-11-26 ENCOUNTER — Ambulatory Visit (INDEPENDENT_AMBULATORY_CARE_PROVIDER_SITE_OTHER): Payer: PPO | Admitting: *Deleted

## 2018-11-26 DIAGNOSIS — M810 Age-related osteoporosis without current pathological fracture: Secondary | ICD-10-CM

## 2018-11-26 MED ORDER — DENOSUMAB 60 MG/ML ~~LOC~~ SOSY
60.0000 mg | PREFILLED_SYRINGE | Freq: Once | SUBCUTANEOUS | Status: AC
  Administered 2018-11-26: 15:00:00 60 mg via SUBCUTANEOUS

## 2018-11-26 NOTE — Progress Notes (Signed)
Pt given Prolia inj Buy and bill Pt tolerated well 

## 2018-12-04 ENCOUNTER — Other Ambulatory Visit: Payer: Self-pay

## 2018-12-04 ENCOUNTER — Ambulatory Visit (INDEPENDENT_AMBULATORY_CARE_PROVIDER_SITE_OTHER): Payer: PPO | Admitting: *Deleted

## 2018-12-04 ENCOUNTER — Other Ambulatory Visit: Payer: Self-pay | Admitting: Nurse Practitioner

## 2018-12-04 VITALS — BP 126/73 | HR 83 | Ht 63.0 in | Wt 140.0 lb

## 2018-12-04 DIAGNOSIS — I10 Essential (primary) hypertension: Secondary | ICD-10-CM

## 2018-12-04 DIAGNOSIS — Z0000 Encounter for general adult medical examination without abnormal findings: Secondary | ICD-10-CM

## 2018-12-04 DIAGNOSIS — F411 Generalized anxiety disorder: Secondary | ICD-10-CM

## 2018-12-04 MED ORDER — METOPROLOL TARTRATE 25 MG PO TABS: 12.5000 mg | ORAL_TABLET | Freq: Two times a day (BID) | ORAL | 1 refills | Status: DC

## 2018-12-04 MED ORDER — CLONAZEPAM 0.5 MG PO TABS: 0.5000 mg | ORAL_TABLET | Freq: Two times a day (BID) | ORAL | 2 refills | Status: DC | PRN

## 2018-12-04 NOTE — Patient Instructions (Signed)
  Kristen Munoz , Thank you for taking time to come for your Medicare Wellness Visit. I appreciate your ongoing commitment to your health goals. Please review the following plan we discussed and let me know if I can assist you in the future.   These are the goals we discussed: Goals    . Prevent falls     Stay active Travel and visit family        This is a list of the screening recommended for you and due dates:  Health Maintenance  Topic Date Due  . DEXA scan (bone density measurement)  02/10/2017  . Mammogram  05/07/2017  . Flu Shot  01/31/2019  . Tetanus Vaccine  04/11/2019  . Colon Cancer Screening  09/04/2022  .  Hepatitis C: One time screening is recommended by Center for Disease Control  (CDC) for  adults born from [birth year] through 1965.   Completed  . Pneumonia vaccines  Completed

## 2018-12-04 NOTE — Progress Notes (Addendum)
MEDICARE ANNUAL WELLNESS VISIT  12/04/2018  Telephone Visit Disclaimer This Medicare AWV was conducted by telephone due to national recommendations for restrictions regarding the COVID-19 Pandemic (e.g. social distancing).  I verified, using two identifiers, that I am speaking with Kristen Kotykiana S Goodenow or their authorized healthcare agent. I discussed the limitations, risks, security, and privacy concerns of performing an evaluation and management service by telephone and the potential availability of an in-person appointment in the future. The patient expressed understanding and agreed to proceed.   Subjective:  Kristen Munoz is a 73 y.o. female patient of Bennie PieriniMartin, Mary-Margaret, FNP who had a Medicare Annual Wellness Visit today via telephone. Kristen Munoz is Retired and lives with their spouse. she has 3 children. she reports that she is socially active and does interact with friends/family regularly. she is minimally physically active and enjoys gardening.  Patient Care Team: Bennie PieriniMartin, Mary-Margaret, FNP as PCP - General (Nurse Practitioner) Michaelle CopasLe, Yen Thi Hong, MD as Referring Physician (Optometry)  Advanced Directives 12/04/2018 06/27/2017 10/07/2015 09/17/2014  Does Patient Have a Medical Advance Directive? No No No No  Would patient like information on creating a medical advance directive? No - Patient declined - Yes - Educational materials given No - patient declined information    Hospital Utilization Over the Past 12 Months: # of hospitalizations or ER visits: 0 # of surgeries: 0  Review of Systems    Patient reports that her overall health is unchanged compared to last year.  Patient Reported Readings (BP, Pulse, CBG, Weight, etc) BP 126/73   Pulse 83   Ht 5\' 3"  (1.6 m)   Wt 140 lb (63.5 kg)   BMI 24.80 kg/m    Review of Systems: General ROS: negative  All other systems negative.  Pain Assessment       Current Medications & Allergies (verified) Allergies as of 12/04/2018    Reactions   Sulfa Antibiotics Nausea Only      Medication List       Accurate as of December 04, 2018  3:34 PM. If you have any questions, ask your nurse or doctor.        STOP taking these medications   Vitamin D (Ergocalciferol) 1.25 MG (50000 UT) Caps capsule Commonly known as:  DRISDOL     TAKE these medications   aspirin EC 81 MG tablet Take 81 mg by mouth daily.   clonazePAM 0.5 MG tablet Commonly known as:  KLONOPIN TAKE 1 TABLET BY MOUTH TWICE DAILY AS NEEDED   denosumab 60 MG/ML Soln injection Commonly known as:  PROLIA Inject 60 mg into the skin every 6 (six) months. Administer in upper arm, thigh, or abdomen   escitalopram 20 MG tablet Commonly known as:  LEXAPRO Take 1 tablet (20 mg total) by mouth daily.   ferrous sulfate 325 (65 FE) MG tablet Take 1 tablet (325 mg total) by mouth daily.   levothyroxine 25 MCG tablet Commonly known as:  SYNTHROID TAKE ONE TABLET BY MOUTH ONCE DAILY BEFORE BREAKFAST   lisinopril-hydrochlorothiazide 10-12.5 MG tablet Commonly known as:  ZESTORETIC Take 1 tablet by mouth daily.   meloxicam 15 MG tablet Commonly known as:  MOBIC Take 1 tablet (15 mg total) by mouth daily.   methocarbamol 500 MG tablet Commonly known as:  Robaxin Take 1 tablet (500 mg total) by mouth 4 (four) times daily.   metoprolol tartrate 25 MG tablet Commonly known as:  LOPRESSOR Take 0.5 tablets (12.5 mg total) by mouth 2 (two)  times daily.   pantoprazole 40 MG tablet Commonly known as:  PROTONIX Take 1 tablet (40 mg total) by mouth daily.   simvastatin 40 MG tablet Commonly known as:  ZOCOR Take 1 tablet (40 mg total) by mouth daily.       History (reviewed): Past Medical History:  Diagnosis Date  . CAD (coronary artery disease)   . GERD (gastroesophageal reflux disease)   . Hyperlipidemia   . Hypertension   . Insomnia   . Osteoporosis   . Thyroid disease   . Urinary bladder incontinence    Past Surgical History:  Procedure  Laterality Date  . ABDOMINAL HYSTERECTOMY     partial  . bladder      baldder tack  . THYROID SURGERY    . VESICOVAGINAL FISTULA CLOSURE W/ TAH     Family History  Problem Relation Age of Onset  . Cancer Mother        colon  . Hypertension Mother   . Diabetes Mother   . Heart disease Mother   . Cancer Father        lung  . Diabetes Father   . Hypertension Father   . Heart disease Father   . Cancer Sister        bone  . Heart failure Sister   . Birth defects Sister   . Cancer Brother        kidney  . Early death Brother        gun shot  . Birth defects Brother   . Heart failure Brother   . Stevens-Johnson syndrome Sister   . Anxiety disorder Sister   . Depression Sister   . Drug abuse Paternal Uncle   . Thyroid disease Daughter   . Heart attack Daughter   . Diabetes Daughter    Social History   Socioeconomic History  . Marital status: Married    Spouse name: Maurine Minister   . Number of children: 3  . Years of education: Not on file  . Highest education level: Not on file  Occupational History  . Occupation: RETIRED   Social Needs  . Financial resource strain: Not on file  . Food insecurity:    Worry: Not on file    Inability: Not on file  . Transportation needs:    Medical: Not on file    Non-medical: Not on file  Tobacco Use  . Smoking status: Current Every Day Smoker    Packs/day: 1.00    Years: 38.00    Pack years: 38.00    Types: Cigarettes    Start date: 07/02/1974  . Smokeless tobacco: Never Used  Substance and Sexual Activity  . Alcohol use: No  . Drug use: No  . Sexual activity: Never  Lifestyle  . Physical activity:    Days per week: Not on file    Minutes per session: Not on file  . Stress: Not on file  Relationships  . Social connections:    Talks on phone: Not on file    Gets together: Not on file    Attends religious service: Not on file    Active member of club or organization: Not on file    Attends meetings of clubs or  organizations: Not on file    Relationship status: Not on file  Other Topics Concern  . Not on file  Social History Narrative  . Not on file    Activities of Daily Living In your present state of health, do you have  any difficulty performing the following activities: 12/04/2018  Hearing? N  Vision? N  Difficulty concentrating or making decisions? N  Walking or climbing stairs? N  Dressing or bathing? N  Doing errands, shopping? N  Preparing Food and eating ? N  Using the Toilet? N  In the past six months, have you accidently leaked urine? N  Do you have problems with loss of bowel control? N  Managing your Medications? N  Managing your Finances? N  Housekeeping or managing your Housekeeping? N  Some recent data might be hidden    Patient Literacy    Exercise Current Exercise Habits: Home exercise routine, Type of exercise: walking, Time (Minutes): 15, Frequency (Times/Week): 3, Weekly Exercise (Minutes/Week): 45, Intensity: Mild, Exercise limited by: None identified  Diet Patient reports consuming 2 meals a day and 2 snack(s) a day Patient reports that her primary diet is: Regular Patient reports that she does have regular access to food.   Depression Screen PHQ 2/9 Scores 12/04/2018 05/09/2018 06/12/2017 05/20/2017 10/10/2016 04/18/2016 10/07/2015  PHQ - 2 Score 1 6 2 2 6 3 2   PHQ- 9 Score - 13 5 6 12 9 3      Fall Risk Fall Risk  12/04/2018 05/20/2017 10/10/2016 04/18/2016 10/07/2015  Falls in the past year? 0 No No No No  Number falls in past yr: - - - - -  Injury with Fall? - - - - -  Follow up - - - - -     Objective:  Kristen Kotyk seemed alert and oriented and she participated appropriately during our telephone visit.  Blood Pressure Weight BMI  BP Readings from Last 3 Encounters:  12/04/18 126/73  05/09/18 126/73  06/12/17 115/72   Wt Readings from Last 3 Encounters:  12/04/18 140 lb (63.5 kg)  05/09/18 145 lb (65.8 kg)  06/12/17 156 lb (70.8 kg)   BMI Readings  from Last 1 Encounters:  12/04/18 24.80 kg/m    *Unable to obtain current vital signs, weight, and BMI due to telephone visit type  Hearing/Vision  . Nathalia did not seem to have difficulty with hearing/understanding during the telephone conversation . Reports that she has had a formal eye exam by an eye care professional within the past year . Reports that she has not had a formal hearing evaluation within the past year *Unable to fully assess hearing and vision during telephone visit type  Cognitive Function: 6CIT Screen 12/04/2018  What Year? 0 points  What month? 0 points  What time? 0 points  Count back from 20 0 points  Months in reverse 0 points  Repeat phrase 0 points  Total Score 0    Normal Cognitive Function Screening: Yes (Normal:0-7, Significant for Dysfunction: >8)  Immunization & Health Maintenance Record Immunization History  Administered Date(s) Administered  . Influenza, High Dose Seasonal PF 04/18/2016, 05/20/2017, 05/09/2018  . Influenza,inj,Quad PF,6+ Mos 04/22/2013, 06/05/2014, 03/31/2015  . Pneumococcal Conjugate-13 10/11/2014  . Pneumococcal Polysaccharide-23 05/03/2011  . Zoster 09/17/2014    Health Maintenance  Topic Date Due  . DEXA SCAN  02/10/2017  . MAMMOGRAM  05/07/2017  . INFLUENZA VACCINE  01/31/2019  . TETANUS/TDAP  04/11/2019  . COLONOSCOPY  09/04/2022  . Hepatitis C Screening  Completed  . PNA vac Low Risk Adult  Completed       Assessment  This is a routine wellness examination for XIAN DEUPREE.  Health Maintenance: Due or Overdue Health Maintenance Due  Topic Date Due  . DEXA SCAN  02/10/2017  . MAMMOGRAM  05/07/2017    Kristen Kotyk does not need a referral for Community Assistance: Care Management:   no Social Work:    no Prescription Assistance:  no Nutrition/Diabetes Education:  no   Plan:  Personalized Goals Goals Addressed            This Visit's Progress   . Prevent falls       Stay active Travel and  visit family       Personalized Health Maintenance & Screening Recommendations  Td vaccine  Lung Cancer Screening Recommended: no (Low Dose CT Chest recommended if Age 31-80 years, 30 pack-year currently smoking OR have quit w/in past 15 years) Hepatitis C Screening recommended: no HIV Screening recommended: no  Advanced Directives: Written information was not prepared per patient's request.  Referrals & Orders No orders of the defined types were placed in this encounter.   Follow-up Plan . Follow-up with [name], FNP as planned    I have personally reviewed and noted the following in the patient's chart:   . Medical and social history . Use of alcohol, tobacco or illicit drugs  . Current medications and supplements . Functional ability and status . Nutritional status . Physical activity . Advanced directives . List of other physicians . Hospitalizations, surgeries, and ER visits in previous 12 months . Vitals . Screenings to include cognitive, depression, and falls . Referrals and appointments  In addition, I have reviewed and discussed with Kristen Kotyk certain preventive protocols, quality metrics, and best practice recommendations. A written personalized care plan for preventive services as well as general preventive health recommendations is available and can be mailed to the patient at her request.      , Almond Lint  12/04/2018  I have reviewed and agree with the above AWV documentation.   Mary-Margaret Daphine Deutscher, FNP

## 2019-01-19 IMAGING — US US THYROID
1 series · 14 of 25 positions shown · non-contrast
Comparison: Chest CT 11/14/2016

CLINICAL DATA: 71-year-old female with history of thyroid nodule on
recent CT

EXAM:
THYROID ULTRASOUND
TECHNIQUE: Ultrasound examination of the thyroid gland and adjacent soft
tissues was performed.

[Series 1: us thyroid · 0.05mm/px · 14 of 61 slices shown]
[im 1/61]
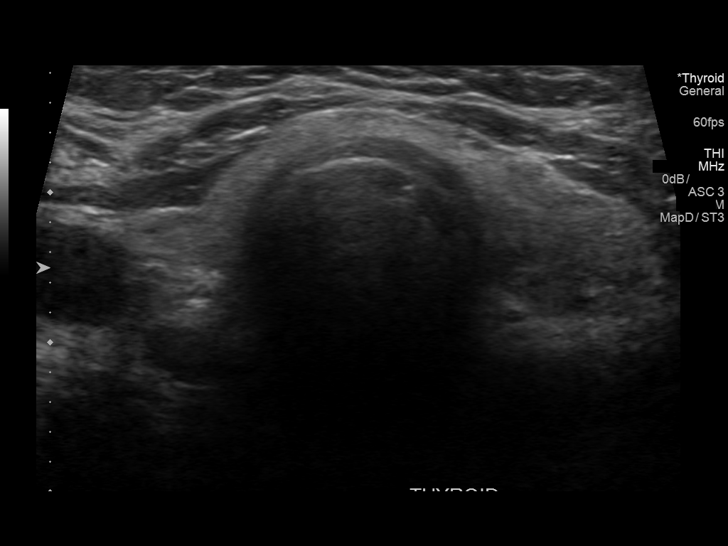
[im 6/61]
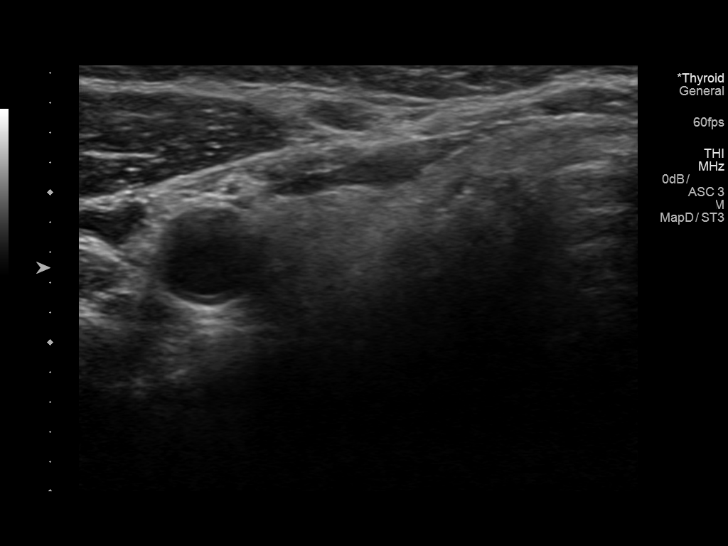
[im 11/61]
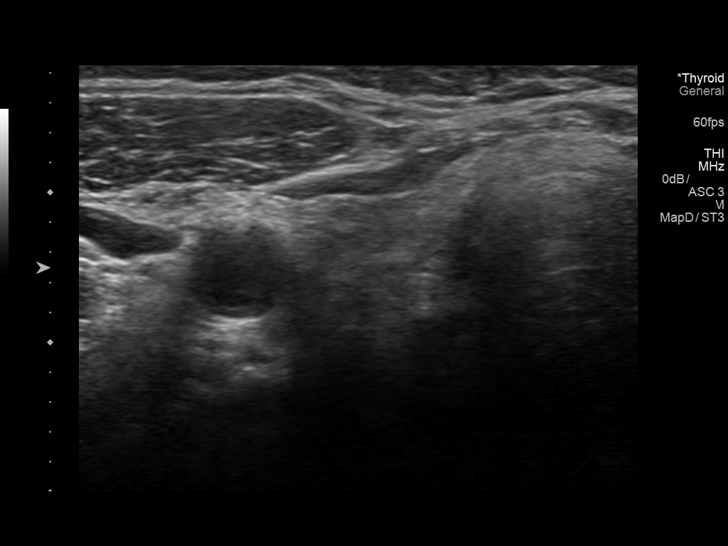
[im 16/61]
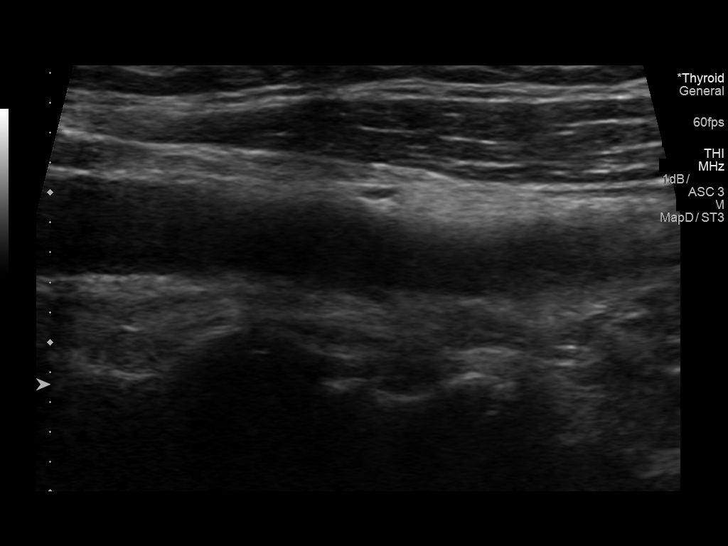
[im 21/61]
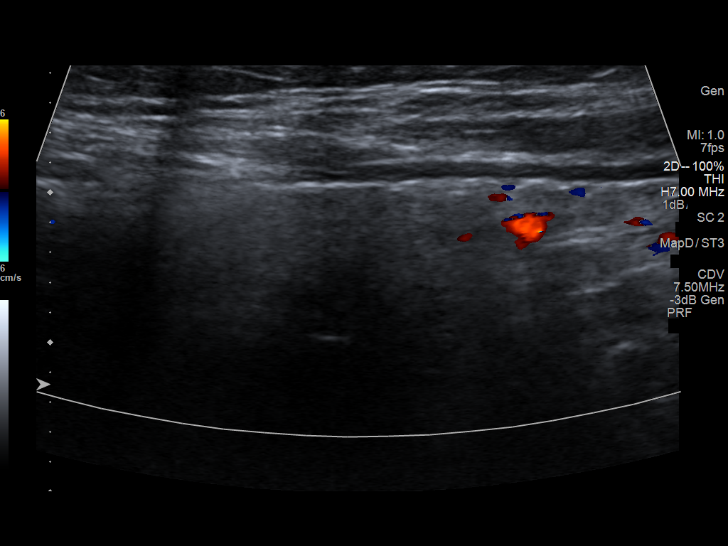
[im 23/61]
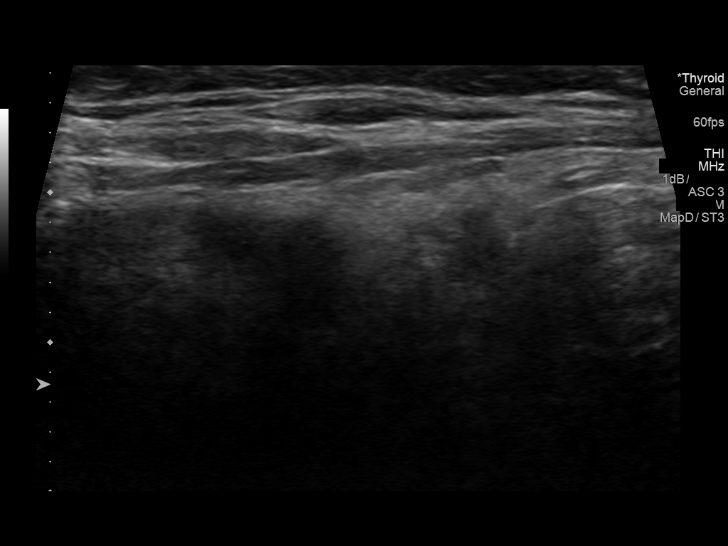
[im 28/61]
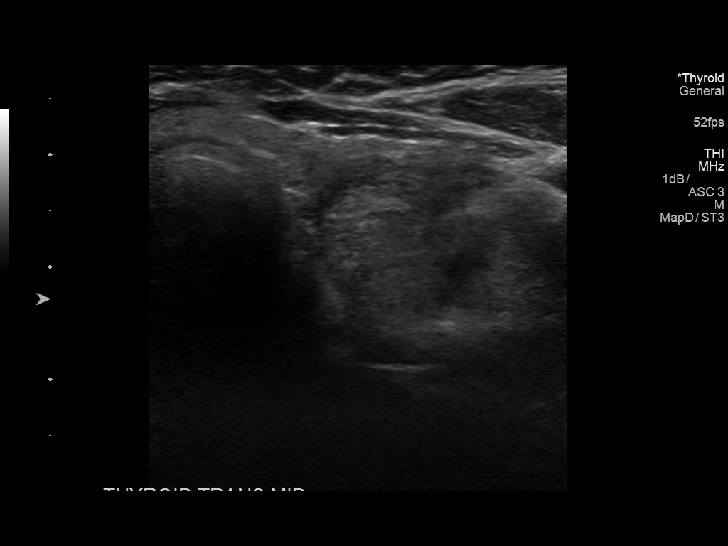
[im 33/61]
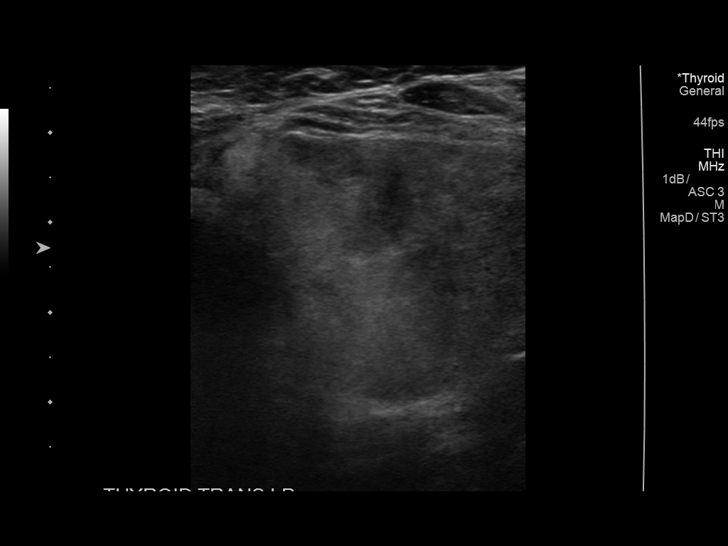
[im 38/61]
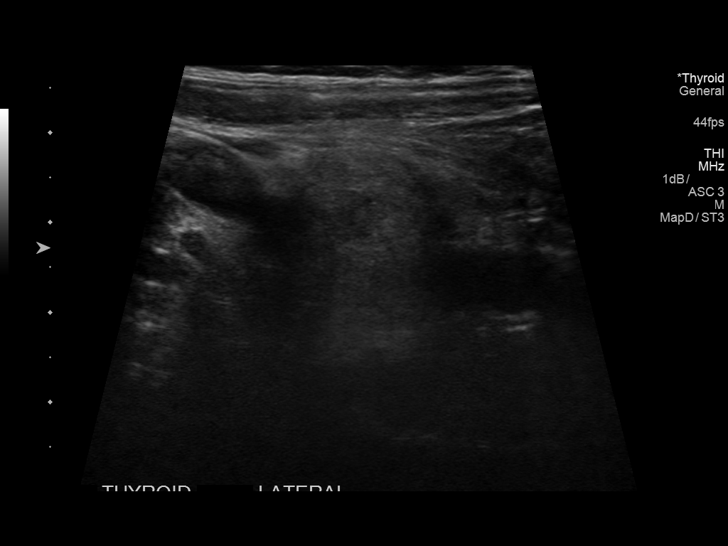
[im 41/61]
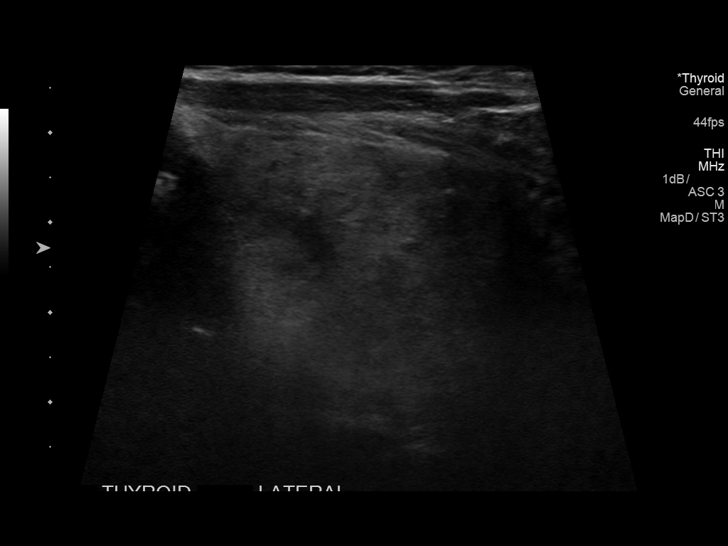
[im 46/61]
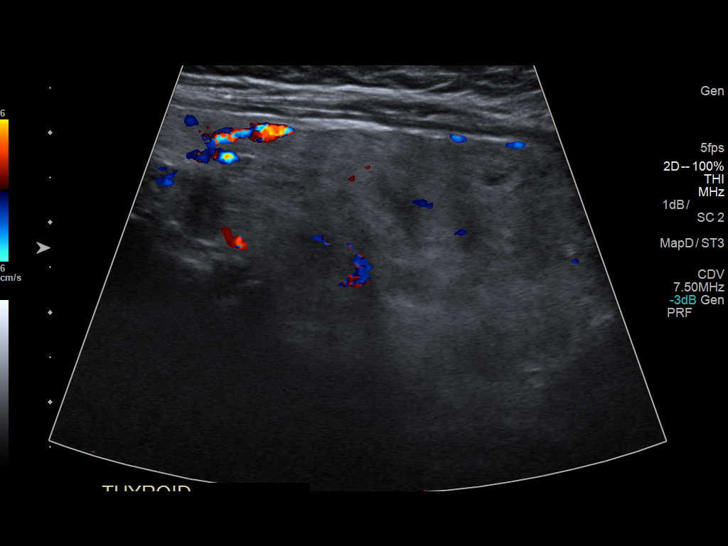
[im 51/61]
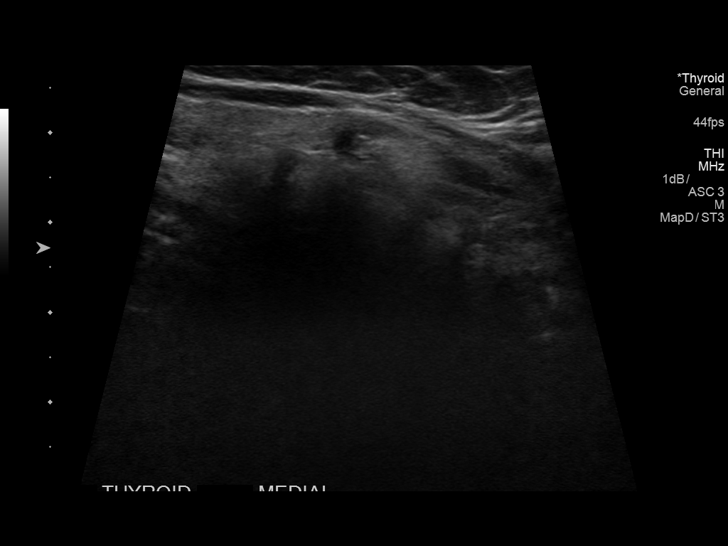
[im 56/61]
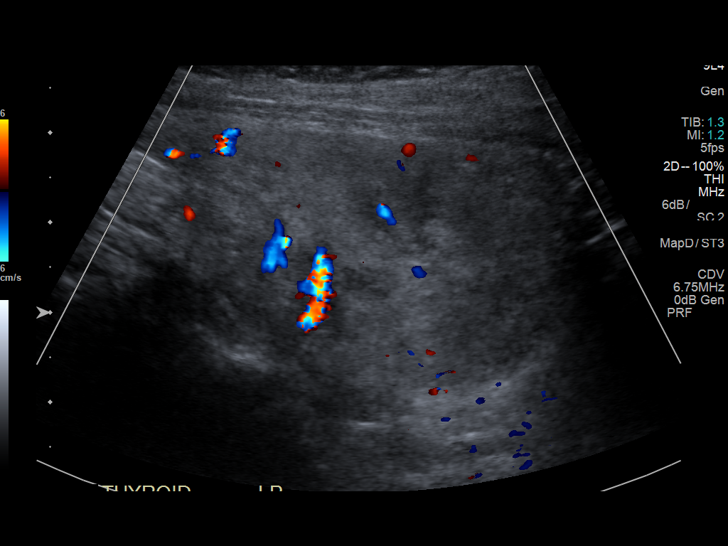
[im 61/61]
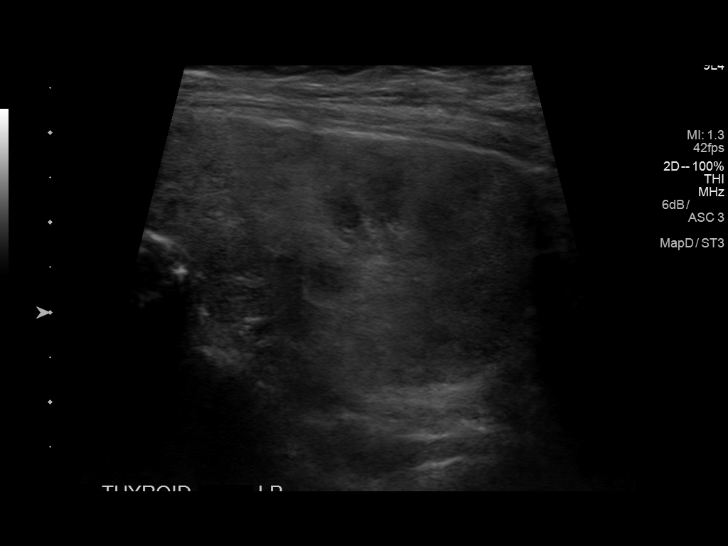

[14 of 25 positions shown; findings below may reference images not displayed]

FINDINGS: Parenchymal Echotexture: Mildly heterogenous

Isthmus: 0.2 cm

Right lobe: 3.5 cm x 1.2 cm x 1.0 cm

Left lobe: 5.2 cm x 3.3 cm x 2.7 cm

_________________________________________________________

Estimated total number of nodules >/= 1 cm: 0

Number of spongiform nodules >/=  2 cm not described below (TR1): 0

Number of mixed cystic and solid nodules >/= 1.5 cm not described
below (TR2): 0

_________________________________________________________

Enlarged left thyroid gland with heterogeneous tissue and no focal
nodule. Ultrasound survey limited for evaluation below the thoracic
inlet.
IMPRESSION: Heterogeneous thyroid tissue, with enlarged left thyroid gland,
compatible with medical thyroid disease.

The study is limited for evaluation below the thoracic inlet, which
is the location of the nodule identified on prior CT.

## 2019-01-29 IMAGING — DX DG ABDOMEN 1V
2 series · 2 of 2 positions shown · non-contrast
Comparison: None.

CLINICAL DATA: Right lower quadrant abdominal pain for 3 days. No
known injury.

EXAM:
ABDOMEN - 1 VIEW

[abdomen kub (1 of 2)]
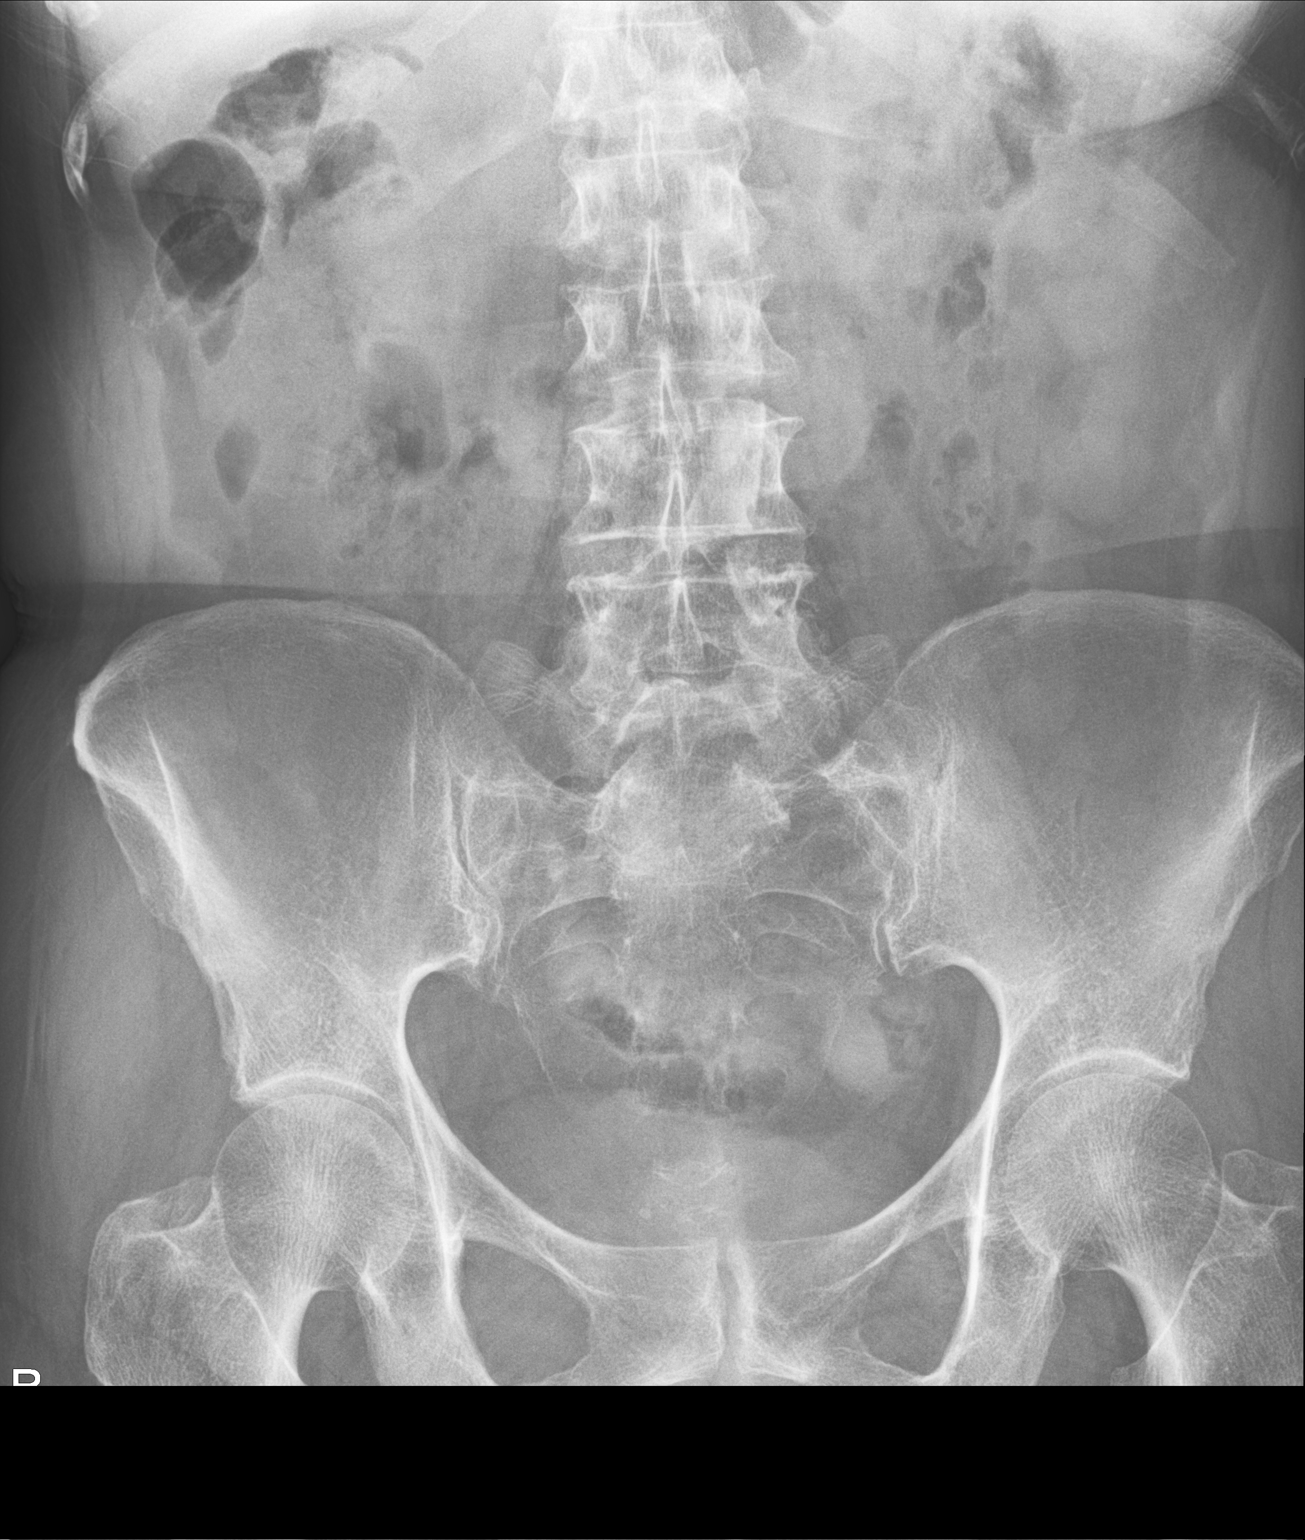

[abdomen kub (2 of 2)]
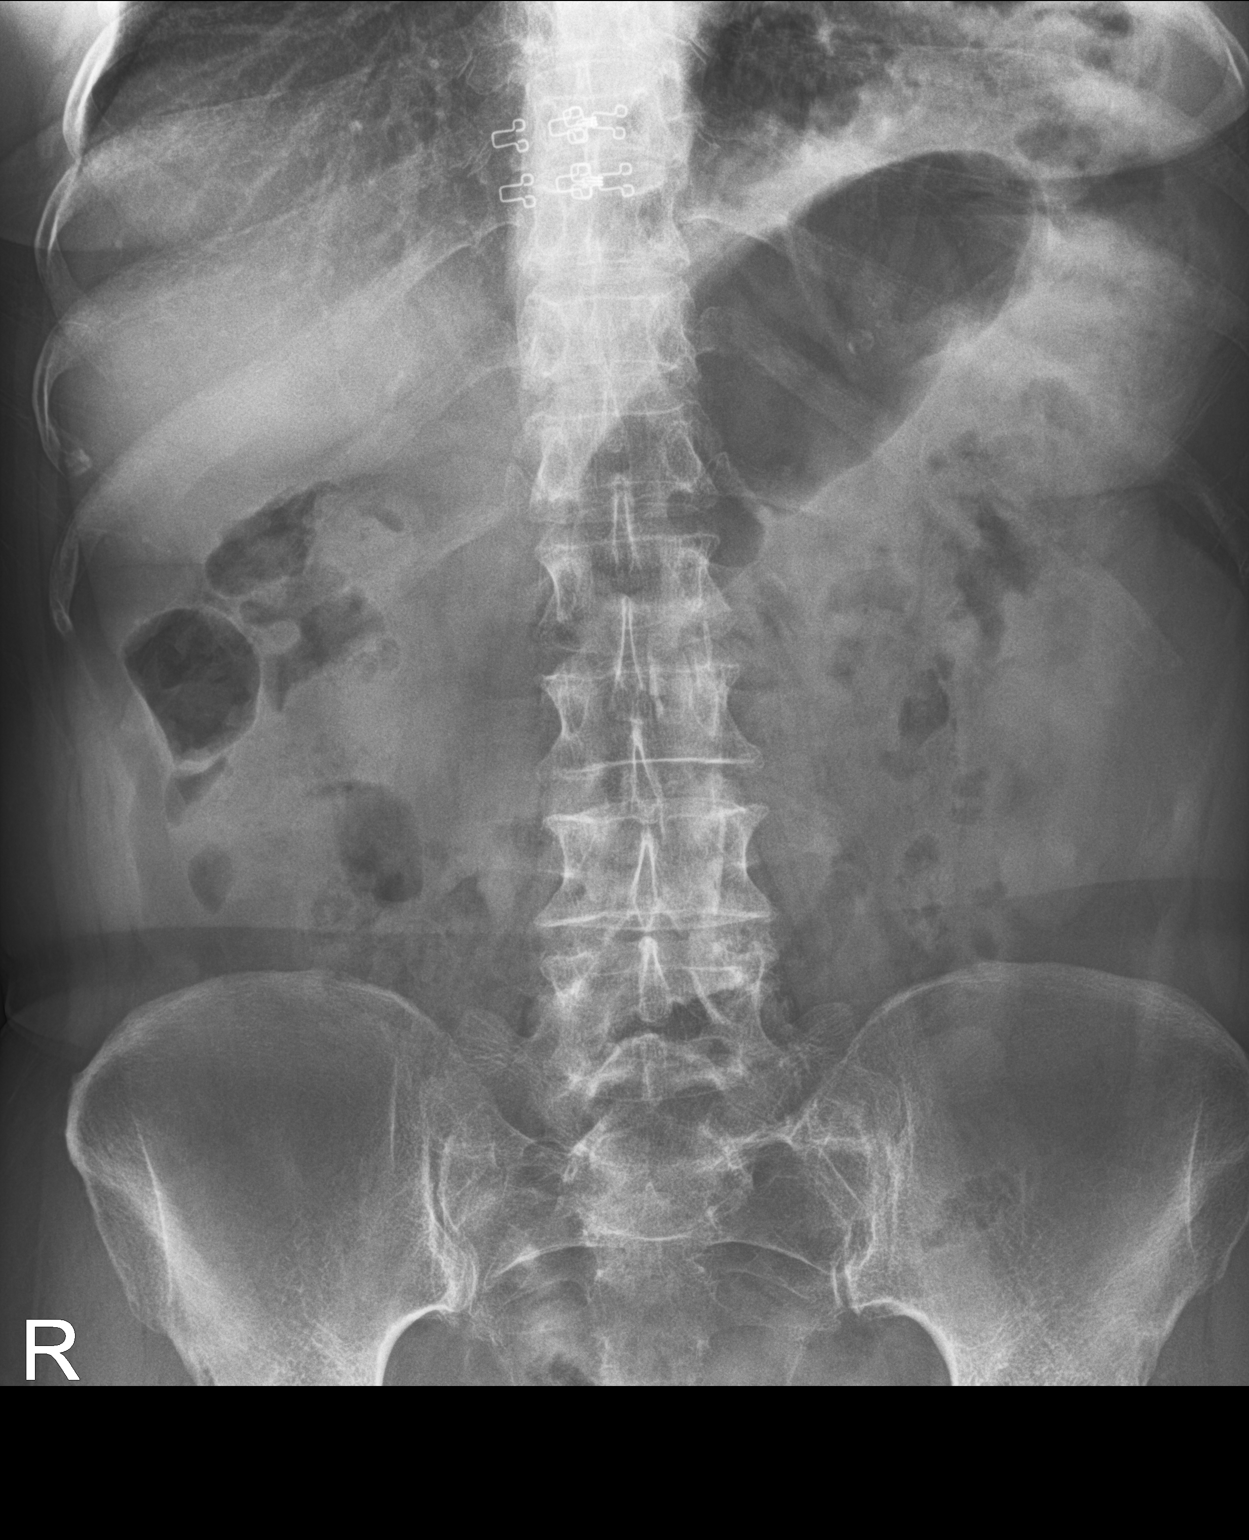

[2 of 2 positions shown; findings below may reference images not displayed]

FINDINGS: The bowel gas pattern is normal. No radio-opaque calculi or other
significant radiographic abnormality are seen.
IMPRESSION: Negative.

## 2019-05-12 DIAGNOSIS — H2513 Age-related nuclear cataract, bilateral: Secondary | ICD-10-CM | POA: Diagnosis not present

## 2019-05-12 DIAGNOSIS — H40033 Anatomical narrow angle, bilateral: Secondary | ICD-10-CM | POA: Diagnosis not present

## 2019-06-01 ENCOUNTER — Other Ambulatory Visit: Payer: Self-pay

## 2019-06-02 ENCOUNTER — Ambulatory Visit (INDEPENDENT_AMBULATORY_CARE_PROVIDER_SITE_OTHER): Payer: PPO

## 2019-06-02 DIAGNOSIS — Z23 Encounter for immunization: Secondary | ICD-10-CM

## 2019-06-02 DIAGNOSIS — M810 Age-related osteoporosis without current pathological fracture: Secondary | ICD-10-CM

## 2019-06-02 MED ORDER — DENOSUMAB 60 MG/ML ~~LOC~~ SOSY
60.0000 mg | PREFILLED_SYRINGE | Freq: Once | SUBCUTANEOUS | Status: AC
  Administered 2019-06-02: 60 mg via SUBCUTANEOUS

## 2019-06-02 NOTE — Progress Notes (Signed)
Prolia injection given to left arm.  Patient tolerated well.  Buy and bill 

## 2019-07-31 ENCOUNTER — Other Ambulatory Visit: Payer: Self-pay

## 2019-08-03 ENCOUNTER — Ambulatory Visit (INDEPENDENT_AMBULATORY_CARE_PROVIDER_SITE_OTHER): Payer: PPO | Admitting: Nurse Practitioner

## 2019-08-03 ENCOUNTER — Other Ambulatory Visit: Payer: Self-pay

## 2019-08-03 ENCOUNTER — Encounter: Payer: Self-pay | Admitting: Nurse Practitioner

## 2019-08-03 VITALS — BP 164/83 | HR 86 | Temp 97.1°F | Resp 20 | Ht 63.0 in | Wt 136.0 lb

## 2019-08-03 DIAGNOSIS — R55 Syncope and collapse: Secondary | ICD-10-CM | POA: Diagnosis not present

## 2019-08-03 NOTE — Patient Instructions (Signed)
     Syncope Syncope is when you pass out (faint) for a short time. It is caused by a sudden decrease in blood flow to the brain. Signs that you may be about to pass out include:  Feeling dizzy or light-headed.  Feeling sick to your stomach (nauseous).  Seeing all white or all black.  Having cold, clammy skin. If you pass out, get help right away. Call your local emergency services (911 in the [place] Do not drive yourself to the hospital. Follow these instructions at home: Watch for any changes in your symptoms. Take these actions to stay safe and help with your symptoms: Lifestyle  Do not drive, use machinery, or play sports until your doctor says it is okay.  Do not drink alcohol.  Do not use any products that contain nicotine or tobacco, such as cigarettes and e-cigarettes. If you need help quitting, ask your doctor.  Drink enough fluid to keep your pee (urine) pale yellow. General instructions  Take over-the-counter and prescription medicines only as told by your doctor.  If you are taking blood pressure or heart medicine, sit up and stand up slowly. Spend a few minutes getting ready to sit and then stand. This can help you feel less dizzy.  Have someone stay with you until you feel stable.  If you start to feel like you might pass out, lie down right away and raise (elevate) your feet above the level of your heart. Breathe deeply and steadily. Wait until all of the symptoms are gone.  Keep all follow-up visits as told by your doctor. This is important. Get help right away if:  You have a very bad headache.  You pass out once or more than once.  You have pain in your chest, belly, or back.  You have a very fast or uneven heartbeat (palpitations).  It hurts to breathe.  You are bleeding from your mouth or your bottom (rectum).  You have black or tarry poop (stool).  You have jerky movements that you cannot control (seizure).  You are confused.  You have  trouble walking.  You are very weak.  You have vision problems. These symptoms may be an emergency. Do not wait to see if the symptoms will go away. Get medical help right away. Call your local emergency services (911 in the [place] Do not drive yourself to the hospital. Summary  Syncope is when you pass out (faint) for a short time. It is caused by a sudden decrease in blood flow to the brain.  Signs that you may be about to faint include feeling dizzy, light-headed, or sick to your stomach, seeing all white or all black, or having cold, clammy skin.  If you start to feel like you might pass out, lie down right away and raise (elevate) your feet above the level of your heart. Breathe deeply and steadily. Wait until all of the symptoms are gone. This information is not intended to replace advice given to you by your health care provider. Make sure you discuss any questions you have with your health care provider. Document Revised: 07/31/2017 Document Reviewed: 07/31/2017 Elsevier Patient Education  2020 Elsevier Inc.  

## 2019-08-03 NOTE — Progress Notes (Signed)
   Subjective:    Patient ID: Kristen Munoz, female    DOB: August 26, 1945, 74 y.o.   MRN: [id number]   Chief Complaint: fall  HPI Patient said she has fallen a couple of times in the last month. She says that she just blacks out. Usually just out a couple of seconds. She does not feel anything prior to occurring. She denies voiding on her self but she did deficate on herself. Sh ehit her head on December 23 when she fail. Denies headaches, blurred visio or nausea and vomiting.  Review of Systems  Constitutional: Negative.   HENT: Negative.   Eyes: Negative for visual disturbance.  Respiratory: Negative.   Cardiovascular: Negative.   Gastrointestinal: Negative.   Genitourinary: Negative.   Musculoskeletal: Negative.   Neurological: Positive for syncope. Negative for dizziness and headaches.  Psychiatric/Behavioral: Negative.   All other systems reviewed and are negative.      Objective:   Physical Exam Vitals and nursing note reviewed.  Constitutional:      Appearance: Normal appearance.  Cardiovascular:     Rate and Rhythm: Normal rate and regular rhythm.     Heart sounds: Normal heart sounds.  Pulmonary:     Effort: Pulmonary effort is normal.     Breath sounds: Normal breath sounds.  Skin:    General: Skin is warm.  Neurological:     General: No focal deficit present.     Mental Status: She is alert and oriented to person, place, and time.  Psychiatric:        Behavior: Behavior normal.    BP (!) 164/83   Pulse 86   Temp (!) 97.1 F (36.2 C) (Temporal)   Resp 20   Ht '5\' 3"'$  (1.6 m)   Wt 136 lb (61.7 kg)   SpO2 98%   BMI 24.09 kg/m    EKG- Kerry Hough, FNP      Assessment & Plan:  Kristen Munoz in today with chief complaint of Fall   1. Syncope, unspecified syncope type Labs pending - EKG 12-Lead - Ambulatory referral to Neurology - CBC with Differential/Platelet - CMP14+EGFR - Thyroid Panel With TSH    The above assessment and  management plan was discussed with the patient. The patient verbalized understanding of and has agreed to the management plan. Patient is aware to call the clinic if symptoms persist or worsen. Patient is aware when to return to the clinic for a follow-up visit. Patient educated on when it is appropriate to go to the emergency department.   Mary-Margaret Hassell Done, FNP

## 2019-08-04 LAB — CBC WITH DIFFERENTIAL/PLATELET
Basophils Absolute: 0.1 10*3/uL (ref 0.0–0.2)
Basos: 1 %
EOS (ABSOLUTE): 0.1 10*3/uL (ref 0.0–0.4)
Eos: 1 %
Hematocrit: 37.7 % (ref 34.0–46.6)
Hemoglobin: 12.7 g/dL (ref 11.1–15.9)
Immature Grans (Abs): 0 10*3/uL (ref 0.0–0.1)
Immature Granulocytes: 0 %
Lymphocytes Absolute: 2.4 10*3/uL (ref 0.7–3.1)
Lymphs: 33 %
MCH: 30.2 pg (ref 26.6–33.0)
MCHC: 33.7 g/dL (ref 31.5–35.7)
MCV: 90 fL (ref 79–97)
Monocytes Absolute: 0.5 10*3/uL (ref 0.1–0.9)
Monocytes: 7 %
Neutrophils Absolute: 4.4 10*3/uL (ref 1.4–7.0)
Neutrophils: 58 %
Platelets: 220 10*3/uL (ref 150–450)
RBC: 4.21 x10E6/uL (ref 3.77–5.28)
RDW: 13 % (ref 11.7–15.4)
WBC: 7.4 10*3/uL (ref 3.4–10.8)

## 2019-08-04 LAB — THYROID PANEL WITH TSH
Free Thyroxine Index: 1.4 (ref 1.2–4.9)
T3 Uptake Ratio: 21 % — ABNORMAL LOW (ref 24–39)
T4, Total: 6.6 ug/dL (ref 4.5–12.0)
TSH: 1.42 u[IU]/mL (ref 0.450–4.500)

## 2019-08-04 LAB — CMP14+EGFR
ALT: 5 IU/L (ref 0–32)
AST: 11 IU/L (ref 0–40)
Albumin/Globulin Ratio: 1.8 (ref 1.2–2.2)
Albumin: 4.5 g/dL (ref 3.7–4.7)
Alkaline Phosphatase: 77 IU/L (ref 39–117)
BUN/Creatinine Ratio: 13 (ref 12–28)
BUN: 13 mg/dL (ref 8–27)
Bilirubin Total: 0.3 mg/dL (ref 0.0–1.2)
CO2: 25 mmol/L (ref 20–29)
Calcium: 9.4 mg/dL (ref 8.7–10.3)
Chloride: 102 mmol/L (ref 96–106)
Creatinine, Ser: 0.98 mg/dL (ref 0.57–1.00)
GFR calc Af Amer: 66 mL/min/{1.73_m2} (ref >59)
GFR calc non Af Amer: 57 mL/min/{1.73_m2} — ABNORMAL LOW (ref >59)
Globulin, Total: 2.5 g/dL (ref 1.5–4.5)
Glucose: 126 mg/dL — ABNORMAL HIGH (ref 65–99)
Potassium: 4.7 mmol/L (ref 3.5–5.2)
Sodium: 140 mmol/L (ref 134–144)
Total Protein: 7 g/dL (ref 6.0–8.5)

## 2019-08-11 ENCOUNTER — Telehealth: Payer: Self-pay | Admitting: Nurse Practitioner

## 2019-08-11 NOTE — Telephone Encounter (Signed)
Patient aware of lab results.

## 2019-08-25 ENCOUNTER — Other Ambulatory Visit: Payer: Self-pay | Admitting: Nurse Practitioner

## 2019-08-25 DIAGNOSIS — E039 Hypothyroidism, unspecified: Secondary | ICD-10-CM

## 2019-08-25 DIAGNOSIS — K219 Gastro-esophageal reflux disease without esophagitis: Secondary | ICD-10-CM

## 2019-08-25 DIAGNOSIS — F411 Generalized anxiety disorder: Secondary | ICD-10-CM

## 2019-08-25 NOTE — Telephone Encounter (Signed)
Last office visit 08-03-19

## 2019-09-14 ENCOUNTER — Telehealth: Payer: Self-pay | Admitting: Neurology

## 2019-09-14 ENCOUNTER — Ambulatory Visit: Payer: PPO | Admitting: Neurology

## 2019-09-14 ENCOUNTER — Other Ambulatory Visit: Payer: Self-pay

## 2019-09-14 ENCOUNTER — Encounter: Payer: Self-pay | Admitting: Neurology

## 2019-09-14 VITALS — BP 162/73 | HR 74 | Temp 96.9°F | Ht 63.0 in | Wt 136.5 lb

## 2019-09-14 DIAGNOSIS — R404 Transient alteration of awareness: Secondary | ICD-10-CM

## 2019-09-14 MED ORDER — LEVETIRACETAM 500 MG PO TABS: 500.0000 mg | ORAL_TABLET | Freq: Two times a day (BID) | ORAL | 11 refills | Status: DC

## 2019-09-14 NOTE — Telephone Encounter (Signed)
health team order sent to GI. No auth they will reach out to the patient to schedule.  

## 2019-09-14 NOTE — Progress Notes (Signed)
PATIENT: Kristen Munoz DOB: 12-10-1945  Chief Complaint  Patient presents with  . Passing out event    Reports two events of passing out. The last time was 06/24/19. The first time was several months previous to that date. Before blacking out, she felt flushed, nauseated, vomited and then lost consciousness for an undetermined amount of time. When she woke up, she had experienced urinary and bowel incontinence. She did not seek medical care at that time. She has been under extra stress due to her husband passing away in Mar 23, 2019. Also, her daughter passed away in 22-Mar-2018.   Marland Kitchen PCP    Chevis Pretty, FNP     HISTORICAL  Kristen Munoz is a 74 year old female, seen in request by her primary care nurse practitioner Chevis Pretty for evaluation of passing out spells, initial evaluation was on September 14, 2019.  I have reviewed and summarized the referring note from the referring physician.  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  She take clonazepam 0.5 mg as needed, only couple times each week, she has been a longtime smoker, 1 and 1/2 pack a day for more than 25 years.  Initial passing out episode was in summer 2020, she was under a lot of stress taking care of her husband, when she sent physical therapy away from home 1 day, as soon as she closed the door, she fell to the ground, had transient loss of consciousness, but she denies tongue biting,.  She denies bladder incontinence  Second episode was on June 24, 2019, she was cooking, she suddenly felt flushed, sick in her stomach, trying to go to the bathroom, then passed out, woke up with bowel and bladder incontinence, whole body achy pain, goose bump at left parietal region, she could not tell how long she has loss of  consciousness, she denies tongue biting  Laboratory evaluation in October 2021, normal CMP with exception of mild elevated glucose 126, creatinine 0.98, CBC, hemoglobin of 12.7  REVIEW OF SYSTEMS: Full 14 system review of systems performed and notable only for as above All other review of systems were negative.  ALLERGIES: Allergies  Allergen Reactions  . Sulfa Antibiotics Nausea Only    HOME MEDICATIONS: Current Outpatient Medications  Medication Sig Dispense Refill  . aspirin EC 81 MG tablet Take 81 mg by mouth daily.    . clonazePAM (KLONOPIN) 0.5 MG tablet Take 1 tablet by mouth twice daily as needed 60 tablet 0  . denosumab (PROLIA) 60 MG/ML SOLN injection Inject 60 mg into the skin every 6 (six) months. Administer in upper arm, thigh, or abdomen    . escitalopram (LEXAPRO) 20 MG tablet Take 1 tablet (20 mg total) by mouth daily. 90 tablet 1  . ferrous sulfate 325 (65 FE) MG tablet Take 1 tablet (325 mg total) by mouth daily. 90 tablet 1  . levothyroxine (SYNTHROID) 25 MCG tablet TAKE 1 TABLET BY MOUTH ONCE DAILY BEFORE BREAKFAST 90 tablet 0  . lisinopril-hydrochlorothiazide (PRINZIDE,ZESTORETIC) 10-12.5 MG tablet Take 1 tablet by mouth daily. 90 tablet 1  . pantoprazole (PROTONIX) 40 MG tablet Take 1 tablet by mouth once daily 90 tablet 0  . simvastatin (ZOCOR) 40 MG tablet Take 1 tablet (40 mg total) by mouth daily. 90 tablet 1  . metoprolol tartrate (LOPRESSOR) 25 MG  tablet Take 0.5 tablets (12.5 mg total) by mouth 2 (two) times daily. 90 tablet 1   No current facility-administered medications for this visit.    PAST MEDICAL HISTORY: Past Medical History:  Diagnosis Date  . CAD (coronary artery disease)   . GERD (gastroesophageal reflux disease)   . Hyperlipidemia   . Hypertension   . Insomnia   . Osteoporosis   . Syncopal episodes   . Thyroid disease   . Urinary bladder incontinence     PAST SURGICAL HISTORY: Past Surgical History:  Procedure Laterality Date  .  ABDOMINAL HYSTERECTOMY     partial  . bladder      baldder tack  . THYROID SURGERY    . VESICOVAGINAL FISTULA CLOSURE W/ TAH      FAMILY HISTORY: Family History  Problem Relation Age of Onset  . Cancer Mother        colon  . Hypertension Mother   . Diabetes Mother   . Heart disease Mother   . Cancer Father        lung  . Diabetes Father   . Hypertension Father   . Heart disease Father   . Cancer Sister        bone  . Heart failure Sister   . Birth defects Sister   . Cancer Brother        kidney  . Early death Brother        gun shot  . Birth defects Brother   . Heart failure Brother   . Stevens-Johnson syndrome Sister   . Anxiety disorder Sister   . Depression Sister   . Drug abuse Paternal Uncle   . Thyroid disease Daughter   . Heart attack Daughter   . Diabetes Daughter     SOCIAL HISTORY: Social History   Socioeconomic History  . Marital status: Widowed    Spouse name: Not on file  . Number of children: 3  . Years of education: 11th  . Highest education level: Not on file  Occupational History  . Occupation: RETIRED   Tobacco Use  . Smoking status: Current Every Day Smoker    Packs/day: 1.00    Years: 38.00    Pack years: 38.00    Types: Cigarettes    Start date: 07/02/1974  . Smokeless tobacco: Never Used  Substance and Sexual Activity  . Alcohol use: No  . Drug use: No  . Sexual activity: Never  Other Topics Concern  . Not on file  Social History Narrative   Lives alone,   Right-handed.   Caffeine use: 2 cups coffee daily, 2-3 cans Pepsi daily   Social Determinants of Health   Financial Resource Strain:   . Difficulty of Paying Living Expenses:   Food Insecurity:   . Worried About Programme researcher, broadcasting/film/video in the Last Year:   . Barista in the Last Year:   Transportation Needs:   . Freight forwarder (Medical):   Marland Kitchen Lack of Transportation (Non-Medical):   Physical Activity:   . Days of Exercise per Week:   . Minutes of Exercise  per Session:   Stress:   . Feeling of Stress :   Social Connections:   . Frequency of Communication with Friends and Family:   . Frequency of Social Gatherings with Friends and Family:   . Attends Religious Services:   . Active Member of Clubs or Organizations:   . Attends Banker Meetings:   Marland Kitchen Marital Status:  Intimate Partner Violence:   . Fear of Current or Ex-Partner:   . Emotionally Abused:   Marland Kitchen Physically Abused:   . Sexually Abused:      PHYSICAL EXAM   Vitals:   09/14/19 1319  BP: (!) 162/73  Pulse: 74  Temp: (!) 96.9 F (36.1 C)  Weight: 136 lb 8 oz (61.9 kg)  Height: 5\' 3"  (1.6 m)    Not recorded      Body mass index is 24.18 kg/m.  PHYSICAL EXAMNIATION:  Gen: NAD, conversant, well nourised, well groomed                     Cardiovascular: Regular rate rhythm, no peripheral edema, warm, nontender. Eyes: Conjunctivae clear without exudates or hemorrhage Neck: Supple, no carotid bruits. Pulmonary: Clear to auscultation bilaterally   NEUROLOGICAL EXAM:  MENTAL STATUS: Speech:    Speech is normal; fluent and spontaneous with normal comprehension.  Cognition:     Orientation to time, place and person     Normal recent and remote memory     Normal Attention span and concentration     Normal Language, naming, repeating,spontaneous speech     Fund of knowledge   CRANIAL NERVES: CN II: Visual fields are full to confrontation. Pupils are round equal and briskly reactive to light. CN III, IV, VI: extraocular movement are normal. No ptosis. CN V: Facial sensation is intact to light touch CN VII: Face is symmetric with normal eye closure  CN VIII: Hearing is normal to causal conversation. CN IX, X: Phonation is normal. CN XI: Head turning and shoulder shrug are intact  MOTOR: There is no pronator drift of out-stretched arms. Muscle bulk and tone are normal. Muscle strength is normal.  REFLEXES: Reflexes are 2+ and symmetric at the biceps,  triceps, knees, and ankles. Plantar responses are flexor.  SENSORY: Intact to light touch, pinprick and vibratory sensation are intact in fingers and toes.  COORDINATION: There is no trunk or limb dysmetria noted.  GAIT/STANCE: Posture is normal. Gait is steady with normal steps, base, arm swing, and turning. Heel and toe walking are normal. Tandem gait is normal.  Romberg is absent.   DIAGNOSTIC DATA (LABS, IMAGING, TESTING) - I reviewed patient records, labs, notes, testing and imaging myself where available.   ASSESSMENT AND PLAN  Kristen Munoz is a 74 y.o. 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 Ph.D.  [facility] [address], [address] [place], [place] Ph: [phone] Fax: [phone]  CC: Referring Provider

## 2019-09-29 ENCOUNTER — Telehealth: Payer: Self-pay | Admitting: Nurse Practitioner

## 2019-09-29 NOTE — Chronic Care Management (AMB) (Signed)
  Chronic Care Management   Note  09/29/2019 Name: Kristen Munoz MRN: [id number] DOB: 05-20-1946  PIA JEDLICKA is a 74 y.o. year old female who is a primary care patient of Chevis Pretty, [place]. I reached out to Vernie Shanks by phone today in response to a referral sent by Ms. Barnie Mort Dooley's health plan.     Ms. Bushong was given information about Chronic Care Management services today including:  1. CCM service includes personalized support from designated clinical staff supervised by her physician, including individualized plan of care and coordination with other care providers 2. 24/7 contact phone numbers for assistance for urgent and routine care needs. 3. Service will only be billed when office clinical staff spend 20 minutes or more in a month to coordinate care. 4. Only one practitioner may furnish and bill the service in a calendar month. 5. The patient may stop CCM services at any time (effective at the end of the month) by phone call to the office staff. 6. The patient will be responsible for cost sharing (co-pay) of up to 20% of the service fee (after annual deductible is met).  Patient did not agree to enrollment in care management services and does not wish to consider at this time.  Follow up plan: The patient has been provided with contact information for the care management team and has been advised to call with any health related questions or concerns.   Noreene Larsson, [place], [place], [place] [address] Direct Dial: [phone] [email or web address] Website: [email or web address]

## 2019-09-30 IMAGING — DX DG SHOULDER 2+V*L*
3 series · 3 of 3 positions shown · non-contrast
Comparison: Lung cancer screening chest CT - 11/14/2016

CLINICAL DATA: Left-sided shoulder pain.

EXAM:
LEFT SHOULDER - 2+ VIEW

[shoulder ap]
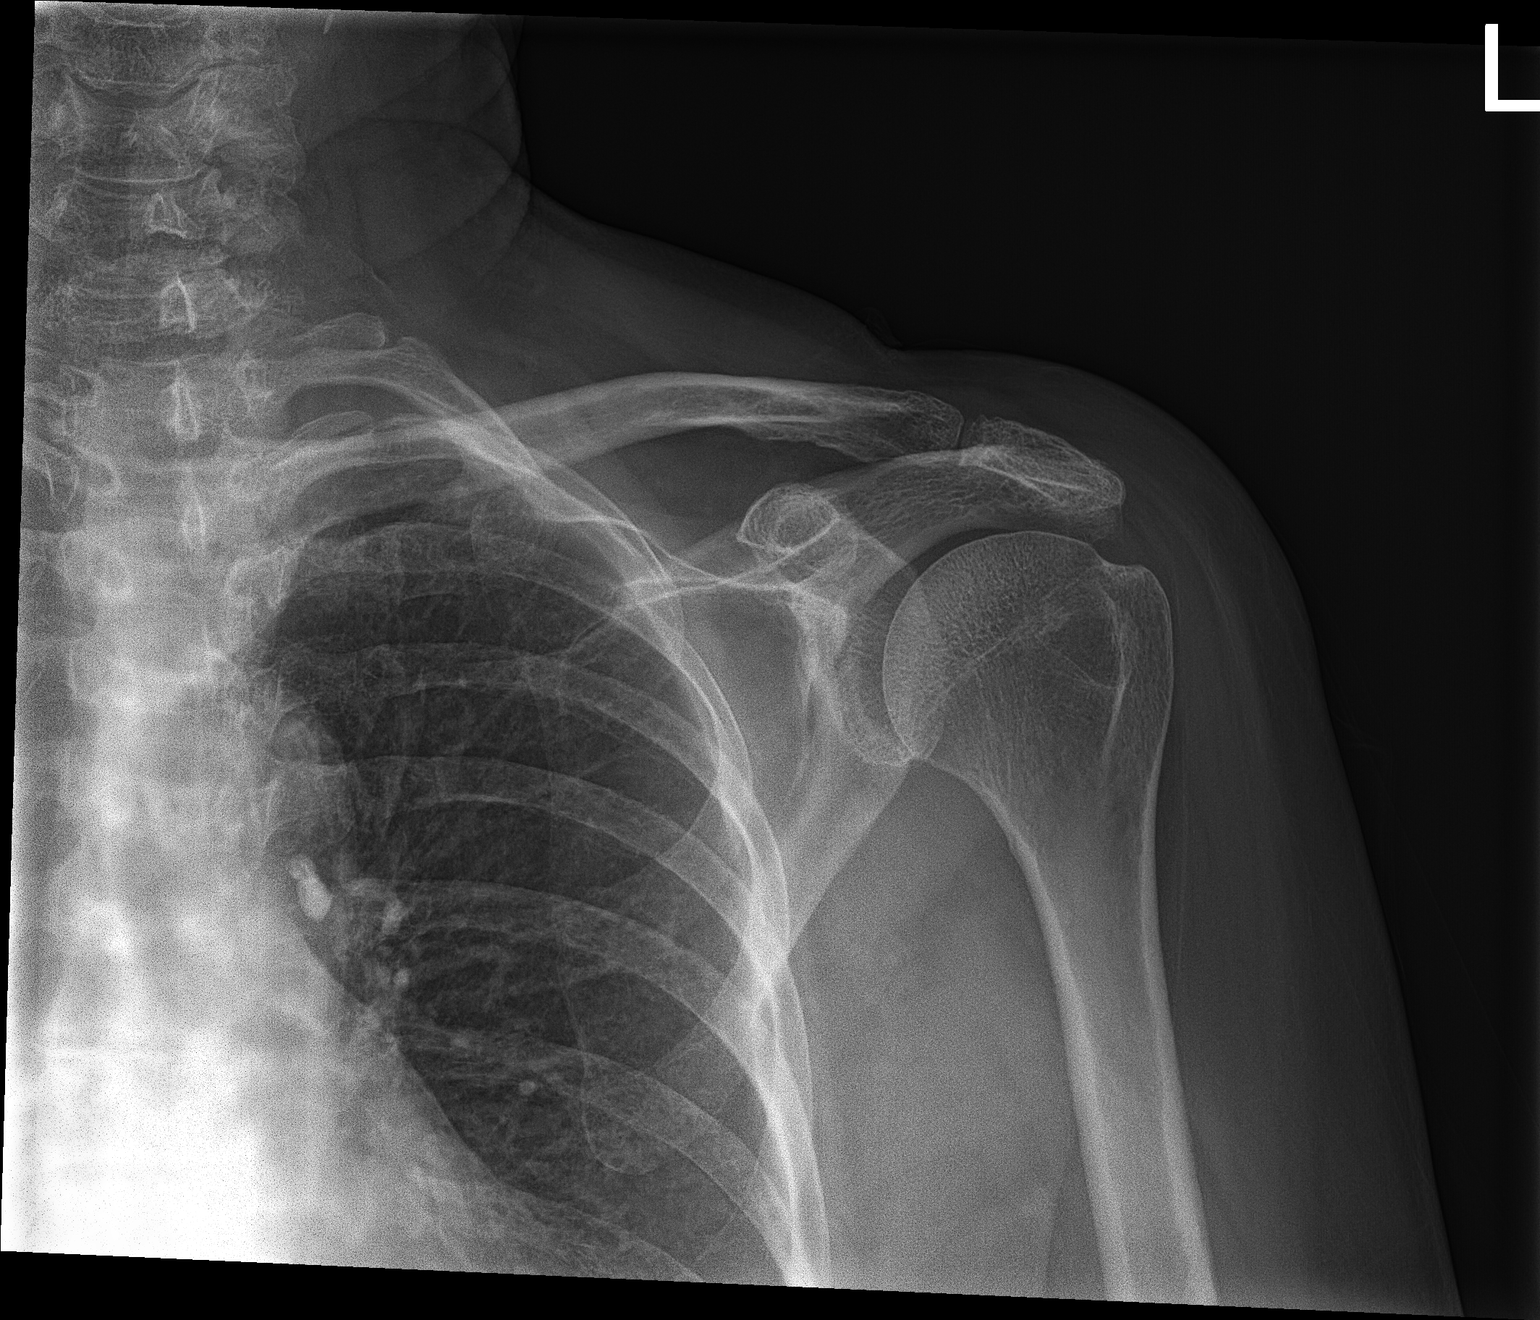

[shoulder obl]
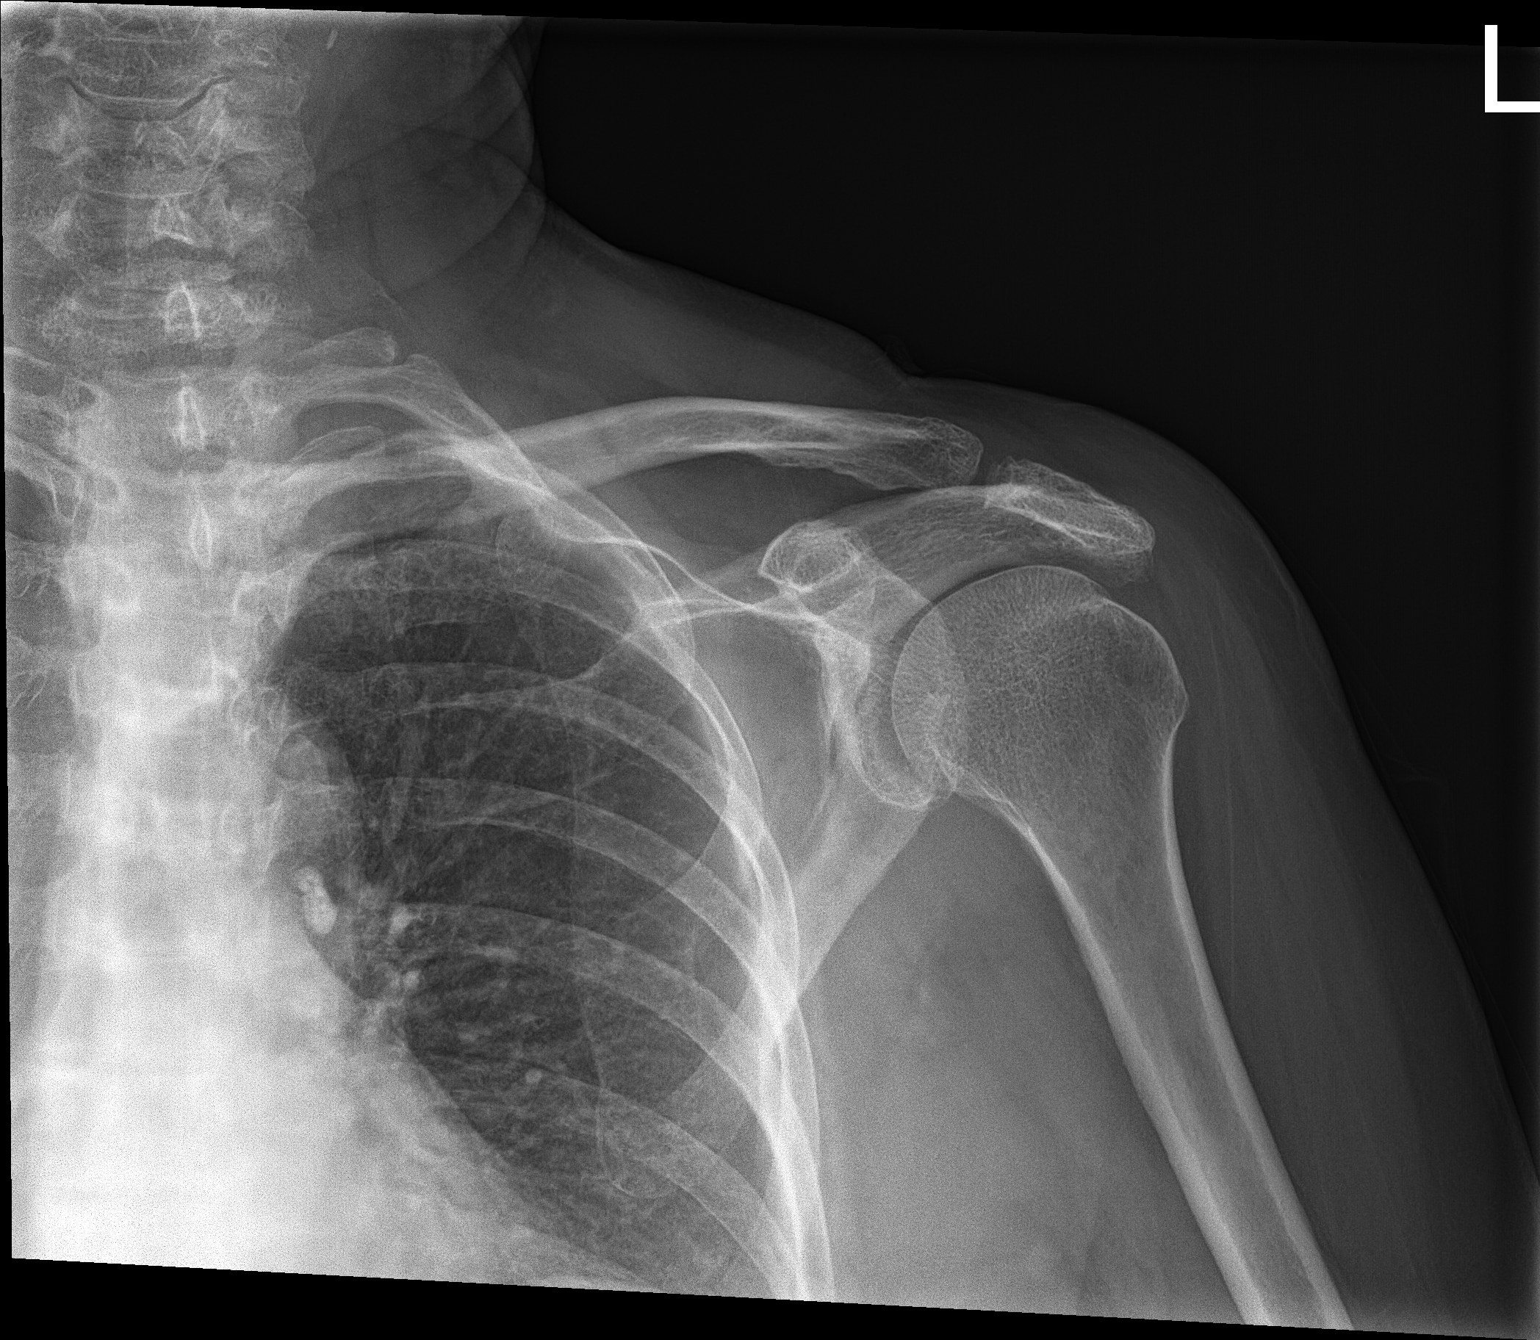

[shoulder axial]
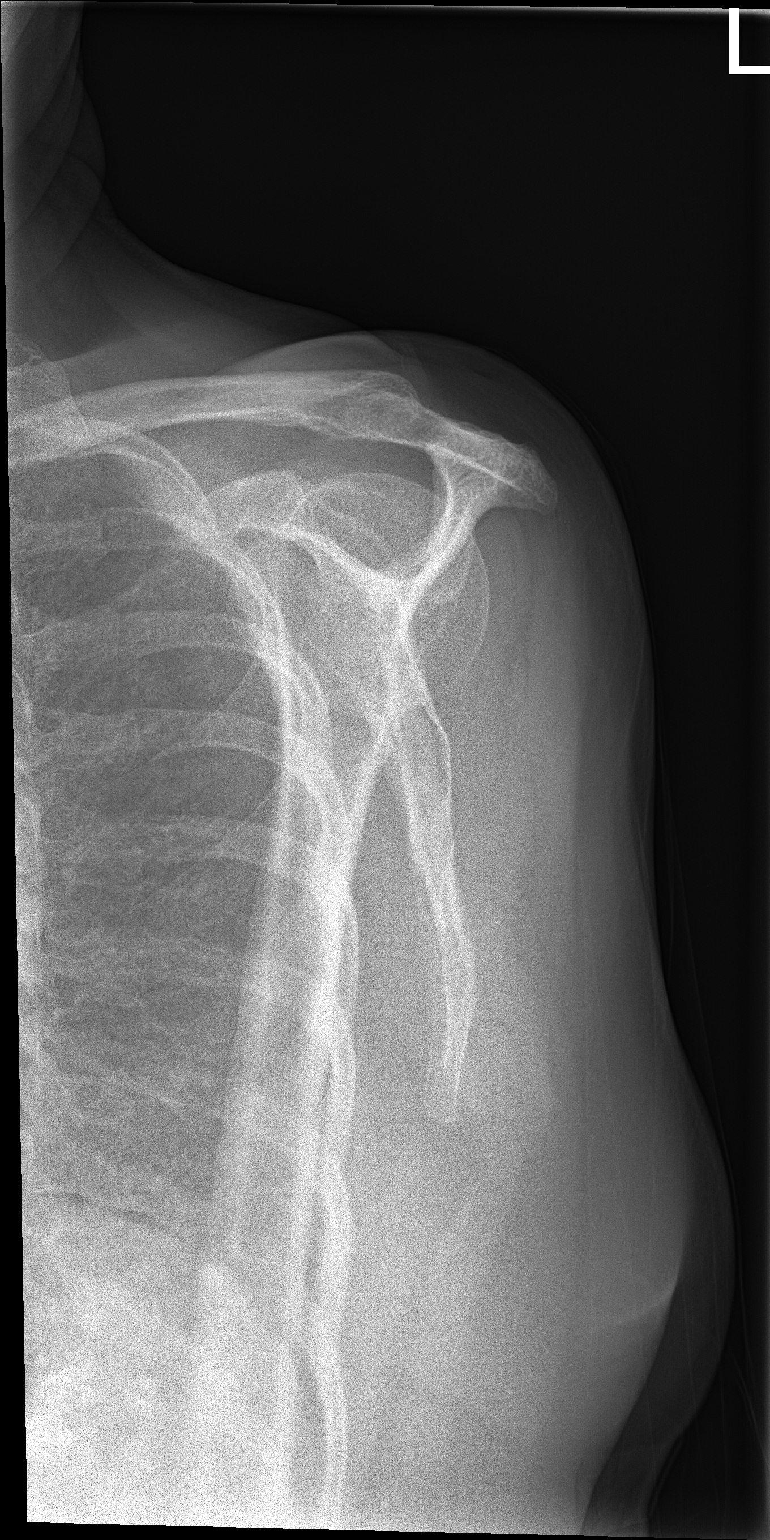

[3 of 3 positions shown; findings below may reference images not displayed]

FINDINGS: No fracture or dislocation. Glenohumeral joint spaces appear
preserved. There is mild degenerative change the left AC joint with
inferiorly directed osteophytosis. No evidence of calcific
tendinitis. Limited visualization of the adjacent thorax
demonstrates left hilar calcified lymph node, similar to recent lung
cancer screening chest CT. Linear atherosclerotic plaque overlies
expected location of the left carotid bulb.
IMPRESSION: 1. Mild degenerative change of the left AC joint. Otherwise, no
explanation for patient's left shoulder pain.
2. Calcified left hilar lymph nodes, unchanged compared to recent
lung cancer screening chest CT, the sequela of prior granulomatous
infection.
3. Linear atherosclerotic plaque overlies expected location of the
left carotid bulb. Further evaluation with carotid Doppler
ultrasound could be performed as indicated.

## 2019-10-05 ENCOUNTER — Ambulatory Visit (INDEPENDENT_AMBULATORY_CARE_PROVIDER_SITE_OTHER): Payer: PPO | Admitting: Neurology

## 2019-10-05 ENCOUNTER — Other Ambulatory Visit: Payer: Self-pay

## 2019-10-05 DIAGNOSIS — R55 Syncope and collapse: Secondary | ICD-10-CM | POA: Diagnosis not present

## 2019-10-05 DIAGNOSIS — R404 Transient alteration of awareness: Secondary | ICD-10-CM

## 2019-10-07 NOTE — Procedures (Signed)
   HISTORY: 74 year old female, presented with passing out spells, she is taking clonazepam 0.5 mg twice a day, Lexapro 20 mg daily, TECHNIQUE:  This is a routine 16 channel EEG recording with one channel devoted to a limited EKG recording.  It was performed during wakefulness, drowsiness and asleep.  Hyperventilation and photic stimulation were performed as activating procedures.  There are minimum muscle and movement artifact noted.  Upon maximum arousal, posterior dominant waking rhythm consistent of rhythmic alpha and beta range activity, activities are symmetric over the bilateral posterior derivations and attenuated with eye opening.  Hyperventilation produced mild/moderate buildup with higher amplitude and the slower activities noted.  Photic stimulation did not alter the tracing.  During EEG recording, patient developed drowsiness and no deeper stage of sleep was achieved  During EEG recording, there was no epileptiform discharge noted.  EKG demonstrate sinus rhythm, with heart rate of 56 bpm.  CONCLUSION: This is a  normal awake EEG.  There is no electrodiagnostic evidence of epileptiform discharge, the increased beta activity is usually associated with benzodiazepine use.  Levert Feinstein, M.D. Ph.D.  [facility] [address] [place], [place] [address] Phone: [phone] Fax:      [phone]

## 2019-10-14 ENCOUNTER — Other Ambulatory Visit: Payer: Self-pay

## 2019-10-14 ENCOUNTER — Ambulatory Visit
Admission: RE | Admit: 2019-10-14 | Discharge: 2019-10-14 | Disposition: A | Discharge status: Home / Self Care | Payer: PPO | Source: Ambulatory Visit | Attending: Neurology | Admitting: Neurology

## 2019-10-14 DIAGNOSIS — R404 Transient alteration of awareness: Secondary | ICD-10-CM

## 2019-10-14 MED ORDER — GADOBENATE DIMEGLUMINE 529 MG/ML IV SOLN
15.0000 mL | Freq: Once | INTRAVENOUS | Status: AC | PRN
  Administered 2019-10-14: 12 mL via INTRAVENOUS

## 2019-10-28 ENCOUNTER — Telehealth: Payer: Self-pay | Admitting: Nurse Practitioner

## 2019-10-28 NOTE — Telephone Encounter (Signed)
Pt aware will have to call specialist to get vaccine

## 2019-11-10 ENCOUNTER — Telehealth: Payer: Self-pay | Admitting: *Deleted

## 2019-11-10 NOTE — Telephone Encounter (Signed)
IMPRESSION:   MRI brain (with and without) demonstrating: - Mild atrophy and mild chronic small vessel ischemic disease.  - No acute findings.  Please call patient, MRI of the brain showed mild age-related changes  EEG was normal

## 2019-11-10 NOTE — Telephone Encounter (Signed)
I called pt. I advised her of the MRI and EEG results. Pt will keep her follow up appt for June. Pt verbalized understanding of results. Pt had no questions at this time but was encouraged to call back if questions arise.

## 2019-11-10 NOTE — Telephone Encounter (Signed)
Pt called for results of EEG and MrI. Please call [phone]

## 2019-12-01 ENCOUNTER — Other Ambulatory Visit: Payer: Self-pay | Admitting: Nurse Practitioner

## 2019-12-01 DIAGNOSIS — K219 Gastro-esophageal reflux disease without esophagitis: Secondary | ICD-10-CM

## 2019-12-04 ENCOUNTER — Other Ambulatory Visit: Payer: Self-pay | Admitting: Nurse Practitioner

## 2019-12-04 DIAGNOSIS — F411 Generalized anxiety disorder: Secondary | ICD-10-CM

## 2019-12-04 DIAGNOSIS — F3342 Major depressive disorder, recurrent, in full remission: Secondary | ICD-10-CM

## 2019-12-17 ENCOUNTER — Ambulatory Visit: Payer: PPO | Admitting: Neurology

## 2019-12-21 ENCOUNTER — Other Ambulatory Visit: Payer: Self-pay | Admitting: Nurse Practitioner

## 2019-12-21 DIAGNOSIS — D509 Iron deficiency anemia, unspecified: Secondary | ICD-10-CM

## 2019-12-21 DIAGNOSIS — I10 Essential (primary) hypertension: Secondary | ICD-10-CM

## 2019-12-21 DIAGNOSIS — E039 Hypothyroidism, unspecified: Secondary | ICD-10-CM

## 2020-01-06 ENCOUNTER — Ambulatory Visit (INDEPENDENT_AMBULATORY_CARE_PROVIDER_SITE_OTHER): Payer: PPO | Admitting: *Deleted

## 2020-01-06 DIAGNOSIS — Z0000 Encounter for general adult medical examination without abnormal findings: Secondary | ICD-10-CM

## 2020-01-06 NOTE — Progress Notes (Signed)
MEDICARE ANNUAL WELLNESS VISIT  01/06/2020  Telephone Visit Disclaimer This Medicare AWV was conducted by telephone due to national recommendations for restrictions regarding the COVID-19 Pandemic (e.g. social distancing).  I verified, using two identifiers, that I am speaking with Kristen Munoz or their authorized healthcare agent. I discussed the limitations, risks, security, and privacy concerns of performing an evaluation and management service by telephone and the potential availability of an in-person appointment in the future. The patient expressed understanding and agreed to proceed.   Subjective:  Kristen Munoz is a 74 y.o. female patient of [name], FNP who had a Medicare Annual Wellness Visit today via telephone. Kristen Munoz is Retired and lives alone. she has 2 living  children. she reports that she is socially active and does interact with friends/family regularly. she is not physically active and enjoys reading and playing computer games.  Patient Care Team: [name], FNP as PCP - General (Nurse Practitioner) Michaelle Copas, MD as Referring Physician (Optometry)  Advanced Directives 01/06/2020 12/04/2018 06/27/2017 10/07/2015 09/17/2014  Does Patient Have a Medical Advance Directive? No No No No No  Does patient want to make changes to medical advance directive? No - Patient declined - - - -  Would patient like information on creating a medical advance directive? - No - Patient declined - Yes - Educational materials given No - patient declined information    Hospital Utilization Over the Past 12 Months: # of hospitalizations or ER visits: 0 # of surgeries: 0  Review of Systems    Patient reports that her overall health is better compared to last year.  History obtained from chart review and the patient  Patient Reported Readings (BP, Pulse, CBG, Weight, etc) none  Pain Assessment Pain : No/denies pain     Current Medications & Allergies  (verified) Allergies as of 01/06/2020      Reactions   Sulfa Antibiotics Nausea Only      Medication List       Accurate as of January 06, 2020 11:05 AM. If you have any questions, ask your nurse or doctor.        aspirin EC 81 MG tablet Take 81 mg by mouth daily.   clonazePAM 0.5 MG tablet Commonly known as: KLONOPIN Take 1 tablet by mouth twice daily as needed   denosumab 60 MG/ML Soln injection Commonly known as: PROLIA Inject 60 mg into the skin every 6 (six) months. Administer in upper arm, thigh, or abdomen   escitalopram 20 MG tablet Commonly known as: LEXAPRO Take 1 tablet (20 mg total) by mouth daily.   Euthyrox 25 MCG tablet Generic drug: levothyroxine TAKE 1 TABLET BY MOUTH ONCE DAILY BEFORE BREAKFAST   ferrous sulfate 325 (65 FE) MG tablet Take 1 tablet (325 mg total) by mouth daily. (Needs to be seen before next refill)   levETIRAcetam 500 MG tablet Commonly known as: KEPPRA Take 1 tablet (500 mg total) by mouth 2 (two) times daily.   lisinopril-hydrochlorothiazide 10-12.5 MG tablet Commonly known as: ZESTORETIC Take 1 tablet by mouth daily. (Needs to be seen before next refill)   metoprolol tartrate 25 MG tablet Commonly known as: LOPRESSOR Take 0.5 tablets (12.5 mg total) by mouth 2 (two) times daily.   pantoprazole 40 MG tablet Commonly known as: PROTONIX Take 1 tablet by mouth once daily   simvastatin 40 MG tablet Commonly known as: ZOCOR Take 1 tablet (40 mg total) by mouth daily.  History (reviewed): Past Medical History:  Diagnosis Date  . CAD (coronary artery disease)   . GERD (gastroesophageal reflux disease)   . Hyperlipidemia   . Hypertension   . Insomnia   . Osteoporosis   . Syncopal episodes   . Thyroid disease   . Urinary bladder incontinence    Past Surgical History:  Procedure Laterality Date  . ABDOMINAL HYSTERECTOMY     partial  . bladder      baldder tack  . THYROID SURGERY    . VESICOVAGINAL FISTULA CLOSURE  W/ TAH     Family History  Problem Relation Age of Onset  . Cancer Mother        colon  . Hypertension Mother   . Diabetes Mother   . Heart disease Mother   . Cancer Father        lung  . Diabetes Father   . Hypertension Father   . Heart disease Father   . Cancer Sister        bone  . Heart failure Sister   . Birth defects Sister   . Cancer Brother        kidney  . Early death Brother        gun shot  . Birth defects Brother   . Heart failure Brother   . Stevens-Johnson syndrome Sister   . Anxiety disorder Sister   . Depression Sister   . Drug abuse Paternal Uncle   . Thyroid disease Daughter   . Heart attack Daughter   . Diabetes Daughter    Social History   Socioeconomic History  . Marital status: Widowed    Spouse name: Not on file  . Number of children: 2  . Years of education: 11th  . Highest education level: Not on file  Occupational History  . Occupation: RETIRED   Tobacco Use  . Smoking status: Current Every Day Smoker    Packs/day: 1.00    Years: 38.00    Pack years: 38.00    Types: Cigarettes    Start date: 07/02/1974  . Smokeless tobacco: Never Used  Vaping Use  . Vaping Use: Never used  Substance and Sexual Activity  . Alcohol use: No  . Drug use: No  . Sexual activity: Not Currently  Other Topics Concern  . Not on file  Social History Narrative   Lives alone,   Right-handed.   Caffeine use: 2 cups coffee daily, 2-3 cans Pepsi daily   Social Determinants of Health   Financial Resource Strain:   . Difficulty of Paying Living Expenses:   Food Insecurity:   . Worried About Programme researcher, broadcasting/film/video in the Last Year:   . Barista in the Last Year:   Transportation Needs:   . Freight forwarder (Medical):   Marland Kitchen Lack of Transportation (Non-Medical):   Physical Activity:   . Days of Exercise per Week:   . Minutes of Exercise per Session:   Stress:   . Feeling of Stress :   Social Connections:   . Frequency of Communication with  Friends and Family:   . Frequency of Social Gatherings with Friends and Family:   . Attends Religious Services:   . Active Member of Clubs or Organizations:   . Attends Banker Meetings:   Marland Kitchen Marital Status:     Activities of Daily Living No flowsheet data found.  Patient Education/ Literacy How often do you need to have someone help you when you read  instructions, pamphlets, or other written materials from your doctor or pharmacy?: 1 - Never What is the last grade level you completed in school?: 11  Exercise    Diet Patient reports consuming 2 meals a day and 2 snack(s) a day Patient reports that her primary diet is: Regular Patient reports that she does have regular access to food.   Depression Screen PHQ 2/9 Scores 01/06/2020 08/03/2019 12/04/2018 05/09/2018 06/12/2017 05/20/2017 10/10/2016  PHQ - 2 Score 1 0 1 6 2 2 6   PHQ- 9 Score - - - 13 5 6 12      Fall Risk Fall Risk  01/06/2020 08/03/2019 12/04/2018 05/20/2017 10/10/2016  Falls in the past year? 1 1 0 No No  Number falls in past yr: 1 1 - - -  Injury with Fall? 1 0 - - -  Risk for fall due to : History of fall(s) - - - -  Follow up Falls prevention discussed - - - -     Objective:  05/22/2017 seemed alert and oriented and she participated appropriately during our telephone visit.  Blood Pressure Weight BMI  BP Readings from Last 3 Encounters:  09/14/19 (!) 162/73  08/03/19 (!) 164/83  12/04/18 126/73   Wt Readings from Last 3 Encounters:  09/14/19 136 lb 8 oz (61.9 kg)  08/03/19 136 lb (61.7 kg)  12/04/18 140 lb (63.5 kg)   BMI Readings from Last 1 Encounters:  09/14/19 24.18 kg/m    *Unable to obtain current vital signs, weight, and BMI due to telephone visit type  Hearing/Vision  . Emree did not seem to have difficulty with hearing/understanding during the telephone conversation . Reports that she has had a formal eye exam by an eye care professional within the past year . Reports that she has not  had a formal hearing evaluation within the past year *Unable to fully assess hearing and vision during telephone visit type  Cognitive Function: 6CIT Screen 12/04/2018  What Year? 0 points  What month? 0 points  What time? 0 points  Count back from 20 0 points  Months in reverse 0 points  Repeat phrase 0 points  Total Score 0   (Normal:0-7, Significant for Dysfunction: >8)  Normal Cognitive Function Screening: Yes   Immunization & Health Maintenance Record Immunization History  Administered Date(s) Administered  . Fluad Quad(high Dose 65+) 06/02/2019  . Influenza, High Dose Seasonal PF 04/18/2016, 05/20/2017, 05/09/2018  . Influenza,inj,Quad PF,6+ Mos 04/22/2013, 06/05/2014, 03/31/2015  . Pneumococcal Conjugate-13 10/11/2014  . Pneumococcal Polysaccharide-23 05/03/2011  . Zoster 09/17/2014    Health Maintenance  Topic Date Due  . COVID-19 Vaccine (1) Never done  . DEXA SCAN  02/10/2017  . MAMMOGRAM  05/07/2017  . TETANUS/TDAP  04/11/2019  . INFLUENZA VACCINE  01/31/2020  . COLONOSCOPY  09/04/2022  . Hepatitis C Screening  Completed  . PNA vac Low Risk Adult  Completed       Assessment  This is a routine wellness examination for KRYSTL WICKWARE.  Health Maintenance: Due or Overdue Health Maintenance Due  Topic Date Due  . COVID-19 Vaccine (1) Never done  . DEXA SCAN  02/10/2017  . MAMMOGRAM  05/07/2017  . TETANUS/TDAP  04/11/2019    13/11/2016 does not need a referral for Community Assistance: Care Management:   no Social Work:    no Prescription Assistance:  no Nutrition/Diabetes Education:  no   Plan:  Personalized Goals Goals Addressed  This Visit's Progress   . DIET - INCREASE WATER INTAKE       Pt is now drinking water, until last winter she would not drink water.    . Have 3 meals a day       Pt had previously lost weight and is now trying to gain some back and stay at a healthly weight    . Prevent falls   On track    Stay  active Travel and visit family       Personalized Health Maintenance & Screening Recommendations  Td vaccine Screening mammography Bone densitometry screening  Lung Cancer Screening Recommended: yes (Low Dose CT Chest recommended if Age 70-80 years, 30 pack-year currently smoking OR have quit w/in past 15 years) Hepatitis C Screening recommended: no HIV Screening recommended: no  Advanced Directives: Written information was not prepared per patient's request.  Referrals & Orders No orders of the defined types were placed in this encounter.   Follow-up Plan . Follow-up with Bennie PieriniMartin, Mary-Margaret, FNP as planned . Pt is going out of town to [place] for a couple of weeks. She will call when she returns to set up an appt with Paulene FloorMary Martin, FNP and her prolia shot. . Pt will need tdap and dexa here at the office. She is aware. . Pt due for mammogram . Covid vaccines complete . Pt is very independent and feels like she is taking better care of herself than ever before. . Declined any advanced directive information. . Voiced no healthcare concerns. . AVS printed and mailed to pt.    I have personally reviewed and noted the following in the patient's chart:   . Medical and social history . Use of alcohol, tobacco or illicit drugs  . Current medications and supplements . Functional ability and status . Nutritional status . Physical activity . Advanced directives . List of other physicians . Hospitalizations, surgeries, and ER visits in previous 12 months . Vitals . Screenings to include cognitive, depression, and falls . Referrals and appointments  In addition, I have reviewed and discussed with Kristen Kotykiana S Wojdyla certain preventive protocols, quality metrics, and best practice recommendations. A written personalized care plan for preventive services as well as general preventive health recommendations is available and can be mailed to the patient at her request.      Conard NovakRintelmann,  Dara Camargo Charmaine, LPN  7/8/29567/12/2019

## 2020-01-13 ENCOUNTER — Telehealth: Payer: Self-pay | Admitting: Nurse Practitioner

## 2020-01-13 NOTE — Telephone Encounter (Signed)
Appointment scheduled.

## 2020-01-19 ENCOUNTER — Other Ambulatory Visit: Payer: Self-pay

## 2020-01-19 ENCOUNTER — Encounter: Payer: Self-pay | Admitting: Nurse Practitioner

## 2020-01-19 ENCOUNTER — Ambulatory Visit (INDEPENDENT_AMBULATORY_CARE_PROVIDER_SITE_OTHER): Payer: PPO | Admitting: Nurse Practitioner

## 2020-01-19 VITALS — BP 134/78 | HR 87 | Temp 97.5°F | Ht 63.0 in | Wt 139.0 lb

## 2020-01-19 DIAGNOSIS — R55 Syncope and collapse: Secondary | ICD-10-CM | POA: Diagnosis not present

## 2020-01-19 MED ORDER — DENOSUMAB 60 MG/ML ~~LOC~~ SOSY
60.0000 mg | PREFILLED_SYRINGE | Freq: Once | SUBCUTANEOUS | Status: AC
  Administered 2020-01-19: 60 mg via SUBCUTANEOUS

## 2020-01-19 NOTE — Addendum Note (Signed)
Addended by: Caryl Bis on: 01/19/2020 04:00 PM   Modules accepted: Orders

## 2020-01-19 NOTE — Progress Notes (Signed)
Subjective:    Patient ID: Kristen Munoz, female    DOB: 21-Aug-1945, 74 y.o.   MRN: [id number]   Chief Complaint: Loss of Consciousness   HPI Pt states LOC June 13th, fell from sitting in a chair onto floor, Hx of 'blackouts' since May 2020. States a hot and flushing feeling before blackout. Does not know what precipitates episodes. Does not know how long episodes last. States increased fatigue. Has had recent MRI & EEG with no significant findings. Aged bruise noted to left forehead.    Review of Systems  Constitutional: Negative.   HENT: Negative.   Eyes: Negative.   Respiratory: Negative.   Cardiovascular: Negative.   Gastrointestinal: Negative.   Endocrine: Negative.   Genitourinary: Negative.   Musculoskeletal: Negative.   Skin: Negative.   Allergic/Immunologic: Negative.   Neurological: Negative.   Hematological: Negative.   Psychiatric/Behavioral: Negative.   All other systems reviewed and are negative.      Objective:   Physical Exam Vitals and nursing note reviewed.  Constitutional:      Appearance: Normal appearance.  HENT:     Head: Normocephalic and atraumatic.     Right Ear: External ear normal.     Left Ear: External ear normal.     Nose: Nose normal.     Mouth/Throat:     Mouth: Mucous membranes are moist.     Pharynx: Oropharynx is clear.  Eyes:     Pupils: Pupils are equal, round, and reactive to light.  Cardiovascular:     Rate and Rhythm: Normal rate.     Pulses: Normal pulses.  Pulmonary:     Effort: Pulmonary effort is normal.  Abdominal:     General: Bowel sounds are normal.  Musculoskeletal:        General: Normal range of motion.     Cervical back: Normal range of motion.  Skin:    General: Skin is warm and dry.     Capillary Refill: Capillary refill takes less than 2 seconds.  Neurological:     General: No focal deficit present.     Mental Status: She is alert and oriented to person, place, and time. Mental status is at baseline.    Psychiatric:        Mood and Affect: Mood normal.        Behavior: Behavior normal.        Thought Content: Thought content normal.        Judgment: Judgment normal.    BP 134/78   Pulse 87   Temp (!) 97.5 F (36.4 C) (Temporal)   Ht '5\' 3"'  (1.6 m)   Wt 139 lb (63 kg)   BMI 24.62 kg/m   Orthostatic VS for the past 24 hrs:  BP- Lying Pulse- Lying BP- Sitting Pulse- Sitting BP- Standing at 0 minutes Pulse- Standing at 0 minutes  01/19/20 1508 128/74 79 135/70 79 135/69 82         Assessment & Plan:  Vernie Shanks in today with chief complaint of Loss of Consciousness   1. Syncope, unspecified syncope type labspending To ER when and if has another episode. - CBC with Differential/Platelet - CMP14+EGFR    The above assessment and management plan was discussed with the patient. The patient verbalized understanding of and has agreed to the management plan. Patient is aware to call the clinic if symptoms persist or worsen. Patient is aware when to return to the clinic for a follow-up visit. Patient educated on  when it is appropriate to go to the emergency department.   Mary-Margaret Hassell Done, FNP

## 2020-01-19 NOTE — Patient Instructions (Signed)
     Syncope Syncope is when you pass out (faint) for a short time. It is caused by a sudden decrease in blood flow to the brain. Signs that you may be about to pass out include:  Feeling dizzy or light-headed.  Feeling sick to your stomach (nauseous).  Seeing all white or all black.  Having cold, clammy skin. If you pass out, get help right away. Call your local emergency services (911 in the [place] Do not drive yourself to the hospital. Follow these instructions at home: Watch for any changes in your symptoms. Take these actions to stay safe and help with your symptoms: Lifestyle  Do not drive, use machinery, or play sports until your doctor says it is okay.  Do not drink alcohol.  Do not use any products that contain nicotine or tobacco, such as cigarettes and e-cigarettes. If you need help quitting, ask your doctor.  Drink enough fluid to keep your pee (urine) pale yellow. General instructions  Take over-the-counter and prescription medicines only as told by your doctor.  If you are taking blood pressure or heart medicine, sit up and stand up slowly. Spend a few minutes getting ready to sit and then stand. This can help you feel less dizzy.  Have someone stay with you until you feel stable.  If you start to feel like you might pass out, lie down right away and raise (elevate) your feet above the level of your heart. Breathe deeply and steadily. Wait until all of the symptoms are gone.  Keep all follow-up visits as told by your doctor. This is important. Get help right away if:  You have a very bad headache.  You pass out once or more than once.  You have pain in your chest, belly, or back.  You have a very fast or uneven heartbeat (palpitations).  It hurts to breathe.  You are bleeding from your mouth or your bottom (rectum).  You have black or tarry poop (stool).  You have jerky movements that you cannot control (seizure).  You are confused.  You have  trouble walking.  You are very weak.  You have vision problems. These symptoms may be an emergency. Do not wait to see if the symptoms will go away. Get medical help right away. Call your local emergency services (911 in the [place] Do not drive yourself to the hospital. Summary  Syncope is when you pass out (faint) for a short time. It is caused by a sudden decrease in blood flow to the brain.  Signs that you may be about to faint include feeling dizzy, light-headed, or sick to your stomach, seeing all white or all black, or having cold, clammy skin.  If you start to feel like you might pass out, lie down right away and raise (elevate) your feet above the level of your heart. Breathe deeply and steadily. Wait until all of the symptoms are gone. This information is not intended to replace advice given to you by your health care provider. Make sure you discuss any questions you have with your health care provider. Document Revised: 07/31/2017 Document Reviewed: 07/31/2017 Elsevier Patient Education  2020 Elsevier Inc.  

## 2020-01-20 LAB — CBC WITH DIFFERENTIAL/PLATELET
Basophils Absolute: 0 10*3/uL (ref 0.0–0.2)
Basos: 1 %
EOS (ABSOLUTE): 0.1 10*3/uL (ref 0.0–0.4)
Eos: 1 %
Hematocrit: 35.3 % (ref 34.0–46.6)
Hemoglobin: 12.2 g/dL (ref 11.1–15.9)
Immature Grans (Abs): 0 10*3/uL (ref 0.0–0.1)
Immature Granulocytes: 0 %
Lymphocytes Absolute: 3 10*3/uL (ref 0.7–3.1)
Lymphs: 38 %
MCH: 31.4 pg (ref 26.6–33.0)
MCHC: 34.6 g/dL (ref 31.5–35.7)
MCV: 91 fL (ref 79–97)
Monocytes Absolute: 0.6 10*3/uL (ref 0.1–0.9)
Monocytes: 8 %
Neutrophils Absolute: 4.1 10*3/uL (ref 1.4–7.0)
Neutrophils: 52 %
Platelets: 223 10*3/uL (ref 150–450)
RBC: 3.88 x10E6/uL (ref 3.77–5.28)
RDW: 13 % (ref 11.7–15.4)
WBC: 7.8 10*3/uL (ref 3.4–10.8)

## 2020-01-20 LAB — CMP14+EGFR
ALT: 8 IU/L (ref 0–32)
AST: 10 IU/L (ref 0–40)
Albumin/Globulin Ratio: 1.7 (ref 1.2–2.2)
Albumin: 4.7 g/dL (ref 3.7–4.7)
Alkaline Phosphatase: 80 IU/L (ref 48–121)
BUN/Creatinine Ratio: 13 (ref 12–28)
BUN: 14 mg/dL (ref 8–27)
Bilirubin Total: 0.3 mg/dL (ref 0.0–1.2)
CO2: 23 mmol/L (ref 20–29)
Calcium: 10.2 mg/dL (ref 8.7–10.3)
Chloride: 100 mmol/L (ref 96–106)
Creatinine, Ser: 1.1 mg/dL — ABNORMAL HIGH (ref 0.57–1.00)
GFR calc Af Amer: 57 mL/min/{1.73_m2} — ABNORMAL LOW (ref >59)
GFR calc non Af Amer: 50 mL/min/{1.73_m2} — ABNORMAL LOW (ref >59)
Globulin, Total: 2.7 g/dL (ref 1.5–4.5)
Glucose: 102 mg/dL — ABNORMAL HIGH (ref 65–99)
Potassium: 4.5 mmol/L (ref 3.5–5.2)
Sodium: 141 mmol/L (ref 134–144)
Total Protein: 7.4 g/dL (ref 6.0–8.5)

## 2020-02-24 ENCOUNTER — Other Ambulatory Visit: Payer: Self-pay | Admitting: Nurse Practitioner

## 2020-02-24 DIAGNOSIS — K219 Gastro-esophageal reflux disease without esophagitis: Secondary | ICD-10-CM

## 2020-02-24 DIAGNOSIS — I10 Essential (primary) hypertension: Secondary | ICD-10-CM

## 2020-02-25 NOTE — Telephone Encounter (Signed)
Last OV 05/2018 for dx all others have been acute.  Next OV not scheduled

## 2020-02-29 ENCOUNTER — Other Ambulatory Visit: Payer: Self-pay | Admitting: Nurse Practitioner

## 2020-02-29 DIAGNOSIS — D509 Iron deficiency anemia, unspecified: Secondary | ICD-10-CM

## 2020-03-05 ENCOUNTER — Other Ambulatory Visit: Payer: Self-pay | Admitting: Nurse Practitioner

## 2020-03-05 DIAGNOSIS — I10 Essential (primary) hypertension: Secondary | ICD-10-CM

## 2020-06-16 ENCOUNTER — Other Ambulatory Visit: Payer: Self-pay | Admitting: Nurse Practitioner

## 2020-06-16 DIAGNOSIS — I10 Essential (primary) hypertension: Secondary | ICD-10-CM

## 2020-06-17 ENCOUNTER — Other Ambulatory Visit: Payer: Self-pay | Admitting: Nurse Practitioner

## 2020-06-17 DIAGNOSIS — D509 Iron deficiency anemia, unspecified: Secondary | ICD-10-CM

## 2020-06-17 NOTE — Telephone Encounter (Signed)
Needs to be seen

## 2020-06-17 NOTE — Telephone Encounter (Signed)
Appointment scheduled.

## 2020-06-29 ENCOUNTER — Ambulatory Visit (INDEPENDENT_AMBULATORY_CARE_PROVIDER_SITE_OTHER): Payer: PPO | Admitting: Nurse Practitioner

## 2020-06-29 ENCOUNTER — Other Ambulatory Visit: Payer: Self-pay

## 2020-06-29 ENCOUNTER — Encounter: Payer: Self-pay | Admitting: Nurse Practitioner

## 2020-06-29 ENCOUNTER — Ambulatory Visit (INDEPENDENT_AMBULATORY_CARE_PROVIDER_SITE_OTHER): Payer: PPO

## 2020-06-29 VITALS — BP 144/88 | HR 74 | Temp 97.2°F | Resp 20 | Ht 63.0 in | Wt 144.0 lb

## 2020-06-29 DIAGNOSIS — F3342 Major depressive disorder, recurrent, in full remission: Secondary | ICD-10-CM

## 2020-06-29 DIAGNOSIS — D509 Iron deficiency anemia, unspecified: Secondary | ICD-10-CM | POA: Diagnosis not present

## 2020-06-29 DIAGNOSIS — F411 Generalized anxiety disorder: Secondary | ICD-10-CM

## 2020-06-29 DIAGNOSIS — F5101 Primary insomnia: Secondary | ICD-10-CM

## 2020-06-29 DIAGNOSIS — E782 Mixed hyperlipidemia: Secondary | ICD-10-CM

## 2020-06-29 DIAGNOSIS — E039 Hypothyroidism, unspecified: Secondary | ICD-10-CM | POA: Diagnosis not present

## 2020-06-29 DIAGNOSIS — K219 Gastro-esophageal reflux disease without esophagitis: Secondary | ICD-10-CM

## 2020-06-29 DIAGNOSIS — I10 Essential (primary) hypertension: Secondary | ICD-10-CM | POA: Diagnosis not present

## 2020-06-29 DIAGNOSIS — M810 Age-related osteoporosis without current pathological fracture: Secondary | ICD-10-CM

## 2020-06-29 DIAGNOSIS — I2510 Atherosclerotic heart disease of native coronary artery without angina pectoris: Secondary | ICD-10-CM | POA: Diagnosis not present

## 2020-06-29 MED ORDER — ESCITALOPRAM OXALATE 20 MG PO TABS: 20.0000 mg | ORAL_TABLET | Freq: Every day | ORAL | 1 refills | Status: DC

## 2020-06-29 MED ORDER — CLONAZEPAM 0.5 MG PO TABS: 0.5000 mg | ORAL_TABLET | Freq: Two times a day (BID) | ORAL | 1 refills | Status: DC | PRN

## 2020-06-29 MED ORDER — FERROUS SULFATE 325 (65 FE) MG PO TABS: 325.0000 mg | ORAL_TABLET | Freq: Every day | ORAL | 1 refills | Status: DC

## 2020-06-29 MED ORDER — LEVOTHYROXINE SODIUM 25 MCG PO TABS: ORAL_TABLET | ORAL | 1 refills | Status: DC

## 2020-06-29 MED ORDER — METOPROLOL TARTRATE 25 MG PO TABS: ORAL_TABLET | ORAL | 1 refills | Status: DC

## 2020-06-29 MED ORDER — PANTOPRAZOLE SODIUM 40 MG PO TBEC: 40.0000 mg | DELAYED_RELEASE_TABLET | Freq: Every day | ORAL | 1 refills | Status: DC

## 2020-06-29 MED ORDER — SIMVASTATIN 40 MG PO TABS: 40.0000 mg | ORAL_TABLET | Freq: Every day | ORAL | 1 refills | Status: DC

## 2020-06-29 MED ORDER — LISINOPRIL-HYDROCHLOROTHIAZIDE 10-12.5 MG PO TABS: ORAL_TABLET | ORAL | 1 refills | Status: DC

## 2020-06-29 NOTE — Patient Instructions (Signed)

## 2020-06-29 NOTE — Progress Notes (Signed)
Subjective:    Patient ID: Kristen Munoz, female    DOB: 1945/08/16, 74 y.o.   MRN: [id number]   Chief Complaint: Medical Management of Chronic Issues    HPI:  1. Essential hypertension, benign No c/o chest pain, sob or headache. Does not check blood pressure at home. Sh ehas not taken her meds today. Says she forget sto takeit some days. BP Readings from Last 3 Encounters:  06/29/20 (!) 157/91  01/19/20 134/78  09/14/19 (!) 162/73     2. Coronary artery disease involving native coronary artery of native heart without angina pectoris Patient has not seen cardiology in several years.   3. Mixed hyperlipidemia Does try to wtahc diet but does no dedicated exercise. Lab Results  Component Value Date   CHOL 137 05/09/2018   HDL 47 05/09/2018   LDLCALC 55 05/09/2018   TRIG 176 (H) 05/09/2018   CHOLHDL 2.9 05/09/2018     4. Acquired hypothyroidism Having no problems that she is aware of. Lab Results  Component Value Date   TSH 1.420 08/03/2019     5. Gastroesophageal reflux disease, unspecified whether esophagitis present Is on protonix daily and that works well to keep her symptoms under control  6. GAD (generalized anxiety disorder) Klonopin occasionally but has not had a prescription in while.  GAD 7 : Generalized Anxiety Score 06/29/2020 10/11/2016  Nervous, Anxious, on Edge 1 2  Control/stop worrying 3 2  Worry too much - different things - 1  Trouble relaxing 1 1  Restless 1 1  Easily annoyed or irritable 1 2  Afraid - awful might happen 0 1  Total GAD 7 Score - 10  Anxiety Difficulty Somewhat difficult Very difficult    7. Recurrent major depressive disorder, in full remission ([place]) Is on lexapro daily and is doing ok Depression screen [facility] 2/9 06/29/2020 01/19/2020 01/06/2020  Decreased Interest 0 0 1  Down, Depressed, Hopeless 3 0 0  PHQ - 2 Score 3 0 1  Altered sleeping 3 - -  Tired, decreased energy 3 - -  Change in appetite 0 - -  Feeling bad or  failure about yourself  0 - -  Trouble concentrating 0 - -  Moving slowly or fidgety/restless 0 - -  Suicidal thoughts 0 - -  PHQ-9 Score 9 - -  Difficult doing work/chores Not difficult at all - -  Some recent data might be hidden     8. Primary insomnia She sleep 5-6 hours a night  9. Age-related osteoporosis without current pathological fracture No dexascan sine 2018    Outpatient Encounter Medications as of 06/29/2020  Medication Sig  . aspirin EC 81 MG tablet Take 81 mg by mouth daily.  Marland Kitchen denosumab (PROLIA) 60 MG/ML SOLN injection Inject 60 mg into the skin every 6 (six) months. Administer in upper arm, thigh, or abdomen  . EUTHYROX 25 MCG tablet TAKE 1 TABLET BY MOUTH ONCE DAILY BEFORE BREAKFAST  . ferrous sulfate 325 (65 FE) MG tablet Take 1 tablet (325 mg total) by mouth daily with breakfast. (Needs to be seen before next refill)  . lisinopril-hydrochlorothiazide (ZESTORETIC) 10-12.5 MG tablet TAKE 1 TABLET BY MOUTH ONCE DAILY . APPOINTMENT REQUIRED FOR FUTURE REFILLS  . metoprolol tartrate (LOPRESSOR) 25 MG tablet Take 1/2 (one-half) tablet by mouth twice daily  . pantoprazole (PROTONIX) 40 MG tablet Take 1 tablet by mouth once daily  . simvastatin (ZOCOR) 40 MG tablet Take 1 tablet (40 mg total) by mouth daily.  Marland Kitchen  clonazePAM (KLONOPIN) 0.5 MG tablet Take 1 tablet by mouth twice daily as needed (Patient not taking: Reported on 06/29/2020)  . escitalopram (LEXAPRO) 20 MG tablet Take 1 tablet (20 mg total) by mouth daily. (Patient not taking: Reported on 06/29/2020)     Past Surgical History:  Procedure Laterality Date  . ABDOMINAL HYSTERECTOMY     partial  . bladder      baldder tack  . THYROID SURGERY    . VESICOVAGINAL FISTULA CLOSURE W/ TAH      Family History  Problem Relation Age of Onset  . Cancer Mother        colon  . Hypertension Mother   . Diabetes Mother   . Heart disease Mother   . Cancer Father        lung  . Diabetes Father   . Hypertension  Father   . Heart disease Father   . Cancer Sister        bone  . Heart failure Sister   . Birth defects Sister   . Cancer Brother        kidney  . Early death Brother        gun shot  . Birth defects Brother   . Heart failure Brother   . Stevens-Johnson syndrome Sister   . Anxiety disorder Sister   . Depression Sister   . Drug abuse Paternal Uncle   . Thyroid disease Daughter   . Heart attack Daughter   . Diabetes Daughter     New complaints: None today  Social history: Lives by herself since her husband died  Controlled substance contract: 06/29/20    Review of Systems  Constitutional: Negative for diaphoresis.  Eyes: Negative for pain.  Respiratory: Negative for shortness of breath.   Cardiovascular: Negative for chest pain, palpitations and leg swelling.  Gastrointestinal: Negative for abdominal pain.  Endocrine: Negative for polydipsia.  Skin: Negative for rash.  Neurological: Negative for dizziness, weakness and headaches.  Hematological: Does not bruise/bleed easily.  All other systems reviewed and are negative.      Objective:   Physical Exam Vitals and nursing note reviewed.  Constitutional:      General: She is not in acute distress.    Appearance: Normal appearance. She is well-developed and well-nourished.  HENT:     Head: Normocephalic.     Nose: Nose normal.     Mouth/Throat:     Mouth: Oropharynx is clear and moist.  Eyes:     Extraocular Movements: EOM normal.     Pupils: Pupils are equal, round, and reactive to light.  Neck:     Vascular: No carotid bruit or JVD.  Cardiovascular:     Rate and Rhythm: Normal rate and regular rhythm.     Pulses: Intact distal pulses.     Heart sounds: Normal heart sounds.  Pulmonary:     Effort: Pulmonary effort is normal. No respiratory distress.     Breath sounds: Normal breath sounds. No wheezing or rales.  Chest:     Chest wall: No tenderness.  Abdominal:     General: Bowel sounds are normal.  There is no distension or abdominal bruit. Aorta is normal.     Palpations: Abdomen is soft. There is no hepatomegaly, splenomegaly, mass or pulsatile mass.     Tenderness: There is no abdominal tenderness.  Musculoskeletal:        General: No edema. Normal range of motion.     Cervical back: Normal range of motion  and neck supple.  Lymphadenopathy:     Cervical: No cervical adenopathy.  Skin:    General: Skin is warm and dry.  Neurological:     Mental Status: She is alert and oriented to person, place, and time.     Deep Tendon Reflexes: Reflexes are normal and symmetric.  Psychiatric:        Mood and Affect: Mood and affect normal.        Behavior: Behavior normal.        Thought Content: Thought content normal.        Judgment: Judgment normal.    BP (!) 144/88   Pulse 74   Temp (!) 97.2 F (36.2 C) (Temporal)   Resp 20   Ht '5\' 3"'  (1.6 m)   Wt 144 lb (65.3 kg)   SpO2 98%   BMI 25.51 kg/m          Assessment & Plan:  Kristen Munoz comes in today with chief complaint of Medical Management of Chronic Issues   Diagnosis and orders addressed:  1. Essential hypertension, benign Low sodium diet - metoprolol tartrate (LOPRESSOR) 25 MG tablet; Take 1/2 (one-half) tablet by mouth twice daily  Dispense: 90 tablet; Refill: 1 - lisinopril-hydrochlorothiazide (ZESTORETIC) 10-12.5 MG tablet; TAKE 1 TABLET BY MOUTH ONCE DAILY . APPOINTMENT REQUIRED FOR FUTURE REFILLS  Dispense: 90 tablet; Refill: 1 - CBC with Differential/Platelet - CMP14+EGFR  2. Coronary artery disease involving native coronary artery of native heart without angina pectoris  3. Mixed hyperlipidemia Low fat diet - simvastatin (ZOCOR) 40 MG tablet; Take 1 tablet (40 mg total) by mouth daily.  Dispense: 90 tablet; Refill: 1 - Lipid panel  4. Acquired hypothyroidism Labs pending - Thyroid Panel With TSH - levothyroxine (EUTHYROX) 25 MCG tablet; TAKE 1 TABLET BY MOUTH ONCE DAILY BEFORE BREAKFAST  Dispense:  90 tablet; Refill: 1   5.  Gastroesophageal reflux disease Avoid spicy foods Do not eat 2 hours prior to bedtime - pantoprazole (PROTONIX) 40 MG tablet; Take 1 tablet (40 mg total) by mouth daily.  Dispense: 90 tablet; Refill: 1 6. GAD (generalized anxiety disorder) Stress management - clonazePAM (KLONOPIN) 0.5 MG tablet; Take 1 tablet (0.5 mg total) by mouth 2 (two) times daily as needed.  Dispense: 60 tablet; Refill: 1  7. Recurrent major depressive disorder, in full remission ([place]) - escitalopram (LEXAPRO) 20 MG tablet; Take 1 tablet (20 mg total) by mouth daily.  Dispense: 90 tablet; Refill: 1  8. Primary insomnia Bedtime routine  9. Age-related osteoporosis without current pathological fracture Weight beariung exercises    10. Iron deficiency anemia, unspecified iron deficiency anemia type  - ferrous sulfate 325 (65 FE) MG tablet; Take 1 tablet (325 mg total) by mouth daily with breakfast. (Needs to be seen before next refill)  Dispense: 90 tablet; Refill: 1   Labs pending Health Maintenance reviewed Diet and exercise encouraged  Follow up plan: 6 months   Mary-Margaret Hassell Done, FNP

## 2020-06-30 DIAGNOSIS — M8588 Other specified disorders of bone density and structure, other site: Secondary | ICD-10-CM | POA: Diagnosis not present

## 2020-06-30 DIAGNOSIS — M85851 Other specified disorders of bone density and structure, right thigh: Secondary | ICD-10-CM | POA: Diagnosis not present

## 2020-06-30 DIAGNOSIS — Z780 Asymptomatic menopausal state: Secondary | ICD-10-CM | POA: Diagnosis not present

## 2020-06-30 LAB — CMP14+EGFR
ALT: 8 IU/L (ref 0–32)
AST: 16 IU/L (ref 0–40)
Albumin/Globulin Ratio: 1.7 (ref 1.2–2.2)
Albumin: 4.5 g/dL (ref 3.7–4.7)
Alkaline Phosphatase: 77 IU/L (ref 44–121)
BUN/Creatinine Ratio: 7 — ABNORMAL LOW (ref 12–28)
BUN: 8 mg/dL (ref 8–27)
Bilirubin Total: 0.3 mg/dL (ref 0.0–1.2)
CO2: 23 mmol/L (ref 20–29)
Calcium: 9.6 mg/dL (ref 8.7–10.3)
Chloride: 102 mmol/L (ref 96–106)
Creatinine, Ser: 1.13 mg/dL — ABNORMAL HIGH (ref 0.57–1.00)
GFR calc Af Amer: 55 mL/min/{1.73_m2} — ABNORMAL LOW (ref >59)
GFR calc non Af Amer: 48 mL/min/{1.73_m2} — ABNORMAL LOW (ref >59)
Globulin, Total: 2.6 g/dL (ref 1.5–4.5)
Glucose: 85 mg/dL (ref 65–99)
Potassium: 4.2 mmol/L (ref 3.5–5.2)
Sodium: 140 mmol/L (ref 134–144)
Total Protein: 7.1 g/dL (ref 6.0–8.5)

## 2020-06-30 LAB — CBC WITH DIFFERENTIAL/PLATELET
Basophils Absolute: 0 10*3/uL (ref 0.0–0.2)
Basos: 1 %
EOS (ABSOLUTE): 0.1 10*3/uL (ref 0.0–0.4)
Eos: 1 %
Hematocrit: 36.1 % (ref 34.0–46.6)
Hemoglobin: 12.3 g/dL (ref 11.1–15.9)
Immature Grans (Abs): 0 10*3/uL (ref 0.0–0.1)
Immature Granulocytes: 1 %
Lymphocytes Absolute: 2.8 10*3/uL (ref 0.7–3.1)
Lymphs: 35 %
MCH: 30.4 pg (ref 26.6–33.0)
MCHC: 34.1 g/dL (ref 31.5–35.7)
MCV: 89 fL (ref 79–97)
Monocytes Absolute: 0.6 10*3/uL (ref 0.1–0.9)
Monocytes: 8 %
Neutrophils Absolute: 4.5 10*3/uL (ref 1.4–7.0)
Neutrophils: 54 %
Platelets: 234 10*3/uL (ref 150–450)
RBC: 4.04 x10E6/uL (ref 3.77–5.28)
RDW: 12.5 % (ref 11.7–15.4)
WBC: 8 10*3/uL (ref 3.4–10.8)

## 2020-06-30 LAB — THYROID PANEL WITH TSH
Free Thyroxine Index: 1.9 (ref 1.2–4.9)
T3 Uptake Ratio: 24 % (ref 24–39)
T4, Total: 8.1 ug/dL (ref 4.5–12.0)
TSH: 1.02 u[IU]/mL (ref 0.450–4.500)

## 2020-06-30 LAB — LIPID PANEL
Chol/HDL Ratio: 4.2 ratio (ref 0.0–4.4)
Cholesterol, Total: 225 mg/dL — ABNORMAL HIGH (ref 100–199)
HDL: 53 mg/dL (ref >39)
LDL Chol Calc (NIH): 133 mg/dL — ABNORMAL HIGH (ref 0–99)
Triglycerides: 218 mg/dL — ABNORMAL HIGH (ref 0–149)
VLDL Cholesterol Cal: 39 mg/dL (ref 5–40)

## 2020-07-29 ENCOUNTER — Ambulatory Visit (INDEPENDENT_AMBULATORY_CARE_PROVIDER_SITE_OTHER): Payer: PPO

## 2020-07-29 ENCOUNTER — Other Ambulatory Visit: Payer: Self-pay

## 2020-07-29 DIAGNOSIS — M810 Age-related osteoporosis without current pathological fracture: Secondary | ICD-10-CM

## 2020-07-29 MED ORDER — DENOSUMAB 60 MG/ML ~~LOC~~ SOSY
60.0000 mg | PREFILLED_SYRINGE | Freq: Once | SUBCUTANEOUS | Status: AC
  Administered 2020-07-29: 60 mg via SUBCUTANEOUS

## 2020-12-01 ENCOUNTER — Encounter: Payer: Self-pay | Admitting: Family

## 2020-12-01 ENCOUNTER — Ambulatory Visit (INDEPENDENT_AMBULATORY_CARE_PROVIDER_SITE_OTHER): Payer: PPO | Admitting: Family

## 2020-12-01 VITALS — BP 135/68 | HR 76 | Temp 97.6°F | Ht 63.0 in | Wt 144.0 lb

## 2020-12-01 DIAGNOSIS — J441 Chronic obstructive pulmonary disease with (acute) exacerbation: Secondary | ICD-10-CM

## 2020-12-01 MED ORDER — DOXYCYCLINE HYCLATE 100 MG PO TABS: 100.0000 mg | ORAL_TABLET | Freq: Two times a day (BID) | ORAL | 0 refills | Status: DC

## 2020-12-01 MED ORDER — PREDNISONE 10 MG (21) PO TBPK: ORAL_TABLET | ORAL | 0 refills | Status: DC

## 2020-12-01 NOTE — Progress Notes (Signed)
Subjective:    Patient ID: Kristen Munoz, female    DOB: 05/23/1946, 75 y.o.   MRN: [id number]  Chief Complaint  Patient presents with  . Cough  . Sinusitis  . Shortness of Breath    Started 2 weeks ago     Cough This is a new problem. The current episode started 1 to 4 weeks ago. The problem has been gradually worsening. The problem occurs every few minutes. The cough is productive of sputum. Associated symptoms include headaches, myalgias, nasal congestion, shortness of breath and wheezing. Pertinent negatives include no ear congestion, ear pain, fever or sore throat. The symptoms are aggravated by lying down. Risk factors for lung disease include smoking/tobacco exposure. She has tried rest for the symptoms. The treatment provided mild relief. Her past medical history is significant for COPD.  Sinusitis Associated symptoms include coughing, headaches and shortness of breath. Pertinent negatives include no ear pain or sore throat.  Shortness of Breath Associated symptoms include headaches and wheezing. Pertinent negatives include no ear pain, fever or sore throat. Her past medical history is significant for COPD.      Review of Systems  Constitutional: Negative for fever.  HENT: Negative for ear pain and sore throat.   Respiratory: Positive for cough, shortness of breath and wheezing.   Musculoskeletal: Positive for myalgias.  Neurological: Positive for headaches.  All other systems reviewed and are negative.      Objective:   Physical Exam Vitals reviewed.  Constitutional:      General: She is not in acute distress.    Appearance: She is well-developed.  HENT:     Head: Normocephalic and atraumatic.     Right Ear: External ear normal.  Eyes:     Pupils: Pupils are equal, round, and reactive to light.  Neck:     Thyroid: No thyromegaly.  Cardiovascular:     Rate and Rhythm: Normal rate and regular rhythm.     Heart sounds: Normal heart sounds. No murmur  heard.   Pulmonary:     Effort: Pulmonary effort is normal. No respiratory distress.     Breath sounds: Wheezing and rhonchi present.  Abdominal:     General: Bowel sounds are normal. There is no distension.     Palpations: Abdomen is soft.     Tenderness: There is no abdominal tenderness.  Musculoskeletal:        General: No tenderness. Normal range of motion.     Cervical back: Normal range of motion and neck supple.  Skin:    General: Skin is warm and dry.  Neurological:     Mental Status: She is alert and oriented to person, place, and time.     Cranial Nerves: No cranial nerve deficit.     Deep Tendon Reflexes: Reflexes are normal and symmetric.  Psychiatric:        Behavior: Behavior normal.        Thought Content: Thought content normal.        Judgment: Judgment normal.     BP 135/68   Pulse 76   Temp 97.6 F (36.4 C) (Temporal)   Ht 5\' 3"  (1.6 m)   Wt 144 lb (65.3 kg)   SpO2 96%   BMI 25.51 kg/m        Assessment & Plan:  Kristen Munoz comes in today with chief complaint of Cough, Sinusitis, and Shortness of Breath (Started 2 weeks ago )   Diagnosis and orders addressed:  1. COPD  exacerbation (HCC) - Take meds as prescribed - Use a cool mist humidifier  -Use saline nose sprays frequently -Force fluids -For any cough or congestion  Use plain Mucinex- regular strength or max strength is fine -For fever or aces or pains- take tylenol or ibuprofen. -Throat lozenges if help -RTO if symptoms worsen or do not improve  - doxycycline (VIBRA-TABS) 100 MG tablet; Take 1 tablet (100 mg total) by mouth 2 (two) times daily.  Dispense: 20 tablet; Refill: 0 - predniSONE (STERAPRED UNI-PAK 21 TAB) 10 MG (21) TBPK tablet; Use as directed  Dispense: 21 tablet; Refill: 0   Jannifer Rodney, FNP

## 2020-12-01 NOTE — Patient Instructions (Signed)

## 2020-12-30 ENCOUNTER — Ambulatory Visit: Payer: Self-pay | Admitting: Nurse Practitioner

## 2021-01-05 ENCOUNTER — Encounter: Payer: Self-pay | Admitting: Nurse Practitioner

## 2021-01-06 ENCOUNTER — Telehealth: Payer: Self-pay | Admitting: Nurse Practitioner

## 2021-01-12 ENCOUNTER — Ambulatory Visit (INDEPENDENT_AMBULATORY_CARE_PROVIDER_SITE_OTHER): Payer: PPO | Admitting: Nurse Practitioner

## 2021-01-12 ENCOUNTER — Encounter: Payer: Self-pay | Admitting: Nurse Practitioner

## 2021-01-12 DIAGNOSIS — R059 Cough, unspecified: Secondary | ICD-10-CM | POA: Diagnosis not present

## 2021-01-12 DIAGNOSIS — R197 Diarrhea, unspecified: Secondary | ICD-10-CM | POA: Insufficient documentation

## 2021-01-12 MED ORDER — DM-GUAIFENESIN ER 30-600 MG PO TB12: 1.0000 | ORAL_TABLET | Freq: Two times a day (BID) | ORAL | 0 refills | Status: DC

## 2021-01-12 MED ORDER — LOPERAMIDE HCL 2 MG PO TABS: 2.0000 mg | ORAL_TABLET | Freq: Four times a day (QID) | ORAL | 0 refills | Status: DC | PRN

## 2021-01-12 NOTE — Assessment & Plan Note (Signed)
Symptoms not well controlled, COVID-19 test completed results pending.  Imodium for diarrhea, increase hydration and electrolytes.

## 2021-01-12 NOTE — Progress Notes (Signed)
   Virtual Visit  Note Due to COVID-19 pandemic this visit was conducted virtually. This visit type was conducted due to national recommendations for restrictions regarding the COVID-19 Pandemic (e.g. social distancing, sheltering in place) in an effort to limit this patient's exposure and mitigate transmission in our community. All issues noted in this document were discussed and addressed.  A physical exam was not performed with this format.  I connected with Kristen Munoz on 01/12/21 at 4 pm by telephone and verified that I am speaking with the correct person using two identifiers. Kristen Munoz is currently located at home during visit. The provider, Daryll Drown, NP is located in their office at time of visit.  I discussed the limitations, risks, security and privacy concerns of performing an evaluation and management service by telephone and the availability of in person appointments. I also discussed with the patient that there may be a patient responsible charge related to this service. The patient expressed understanding and agreed to proceed.   History and Present Illness:  Cough This is a new problem. The current episode started yesterday. The problem has been gradually worsening. The problem occurs constantly. The cough is Productive of sputum. Associated symptoms include nasal congestion and rhinorrhea. Pertinent negatives include no chest pain, chills, ear congestion, ear pain, rash or sore throat. Associated symptoms comments: Diarrhea. Nothing aggravates the symptoms. She has tried nothing for the symptoms.     Review of Systems  Constitutional:  Negative for chills.  HENT:  Positive for rhinorrhea. Negative for ear pain and sore throat.   Respiratory:  Positive for cough.   Cardiovascular:  Negative for chest pain.  Gastrointestinal:  Positive for diarrhea.  Skin:  Negative for rash.  All other systems reviewed and are negative.   Observations/Objective: Tele visit,  patient is not in distress  Assessment and Plan: Symptoms not well controlled, COVID-19 test completed results pending.  Imodium for diarrhea, guaifenesin for cough and congestion, increase hydration and electrolytes.  Follow Up Instructions:  Follow-up with worsening unresolved symptoms.   I discussed the assessment and treatment plan with the patient. The patient was provided an opportunity to ask questions and all were answered. The patient agreed with the plan and demonstrated an understanding of the instructions.   The patient was advised to call back or seek an in-person evaluation if the symptoms worsen or if the condition fails to improve as anticipated.  The above assessment and management plan was discussed with the patient. The patient verbalized understanding of and has agreed to the management plan. Patient is aware to call the clinic if symptoms persist or worsen. Patient is aware when to return to the clinic for a follow-up visit. Patient educated on when it is appropriate to go to the emergency department.   Time call ended:  4:10 pm   I provided 10 minutes of  non face-to-face time during this encounter.    Daryll Drown, NP

## 2021-01-12 NOTE — Assessment & Plan Note (Signed)
Symptoms not well controlled, COVID-19 test completed results pending, guaifenesin for cough and congestion, increase hydration and electrolytes.

## 2021-01-13 ENCOUNTER — Other Ambulatory Visit: Payer: Self-pay | Admitting: Nurse Practitioner

## 2021-01-13 DIAGNOSIS — U071 COVID-19: Secondary | ICD-10-CM

## 2021-01-13 LAB — NOVEL CORONAVIRUS, NAA: SARS-CoV-2, NAA: DETECTED — AB

## 2021-01-13 LAB — SARS-COV-2, NAA 2 DAY TAT

## 2021-01-13 MED ORDER — MOLNUPIRAVIR EUA 200MG CAPSULE: 4.0000 | ORAL_CAPSULE | Freq: Two times a day (BID) | ORAL | 0 refills | Status: DC

## 2021-01-13 MED ORDER — MOLNUPIRAVIR EUA 200MG CAPSULE: 4.0000 | ORAL_CAPSULE | Freq: Two times a day (BID) | ORAL | 0 refills | Status: AC

## 2021-01-23 NOTE — Telephone Encounter (Signed)
Patient would like Prolia. Prolia ordered.

## 2021-01-26 ENCOUNTER — Ambulatory Visit: Payer: PPO

## 2021-02-02 ENCOUNTER — Ambulatory Visit (INDEPENDENT_AMBULATORY_CARE_PROVIDER_SITE_OTHER): Payer: PPO | Admitting: *Deleted

## 2021-02-02 ENCOUNTER — Other Ambulatory Visit: Payer: Self-pay

## 2021-02-02 DIAGNOSIS — M810 Age-related osteoporosis without current pathological fracture: Secondary | ICD-10-CM | POA: Diagnosis not present

## 2021-02-02 MED ORDER — DENOSUMAB 60 MG/ML ~~LOC~~ SOSY
60.0000 mg | PREFILLED_SYRINGE | Freq: Once | SUBCUTANEOUS | Status: AC
  Administered 2021-02-02: 60 mg via SUBCUTANEOUS

## 2021-02-13 ENCOUNTER — Ambulatory Visit (INDEPENDENT_AMBULATORY_CARE_PROVIDER_SITE_OTHER): Payer: PPO

## 2021-02-13 VITALS — Ht 63.0 in | Wt 144.0 lb

## 2021-02-13 DIAGNOSIS — Z0000 Encounter for general adult medical examination without abnormal findings: Secondary | ICD-10-CM | POA: Diagnosis not present

## 2021-02-13 NOTE — Patient Instructions (Signed)
Kristen Munoz , Thank you for taking time to come for your Medicare Wellness Visit. I appreciate your ongoing commitment to your health goals. Please review the following plan we discussed and let me know if I can assist you in the future.   Screening recommendations/referrals: Colonoscopy: Done 09/03/2012 - Repeat in 10 years Mammogram: Done 07/30/2016 - Recommended every year - patient declines repeats Bone Density: Done 06/30/2020 - Repeat every 2 years Recommended yearly ophthalmology/optometry visit for glaucoma screening and checkup Recommended yearly dental visit for hygiene and checkup  Vaccinations: Influenza vaccine: Done 2021 - Repeat annually Pneumococcal vaccine: Done 05/03/2011 & 10/11/2014 Tdap vaccine: Due. (Every 10 years) Shingles vaccine: Zostavax done 09/17/2014 - Shingrix discussed. Please contact your pharmacy for coverage information.     Covid-19:Done 11/04/19 & 12/04/19  Advanced directives: Please bring a copy of your health care power of attorney and living will to the office to be added to your chart at your convenience.   Conditions/risks identified: Aim for 30 minutes of exercise or brisk walking each day, drink 6-8 glasses of water and eat lots of fruits and vegetables.   Next appointment: Follow up in one year for your annual wellness visit    Preventive Care 65 Years and Older, Female Preventive care refers to lifestyle choices and visits with your health care provider that can promote health and wellness. What does preventive care include? A yearly physical exam. This is also called an annual well check. Dental exams once or twice a year. Routine eye exams. Ask your health care provider how often you should have your eyes checked. Personal lifestyle choices, including: Daily care of your teeth and gums. Regular physical activity. Eating a healthy diet. Avoiding tobacco and drug use. Limiting alcohol use. Practicing safe sex. Taking low-dose aspirin every  day. Taking vitamin and mineral supplements as recommended by your health care provider. What happens during an annual well check? The services and screenings done by your health care provider during your annual well check will depend on your age, overall health, lifestyle risk factors, and family history of disease. Counseling  Your health care provider may ask you questions about your: Alcohol use. Tobacco use. Drug use. Emotional well-being. Home and relationship well-being. Sexual activity. Eating habits. History of falls. Memory and ability to understand (cognition). Work and work Astronomer. Reproductive health. Screening  You may have the following tests or measurements: Height, weight, and BMI. Blood pressure. Lipid and cholesterol levels. These may be checked every 5 years, or more frequently if you are over 44 years old. Skin check. Lung cancer screening. You may have this screening every year starting at age 50 if you have a 30-pack-year history of smoking and currently smoke or have quit within the past 15 years. Fecal occult blood test (FOBT) of the stool. You may have this test every year starting at age 68. Flexible sigmoidoscopy or colonoscopy. You may have a sigmoidoscopy every 5 years or a colonoscopy every 10 years starting at age 47. Hepatitis C blood test. Hepatitis B blood test. Sexually transmitted disease (STD) testing. Diabetes screening. This is done by checking your blood sugar (glucose) after you have not eaten for a while (fasting). You may have this done every 1-3 years. Bone density scan. This is done to screen for osteoporosis. You may have this done starting at age 85. Mammogram. This may be done every 1-2 years. Talk to your health care provider about how often you should have regular mammograms. Talk with your  health care provider about your test results, treatment options, and if necessary, the need for more tests. Vaccines  Your health care  provider may recommend certain vaccines, such as: Influenza vaccine. This is recommended every year. Tetanus, diphtheria, and acellular pertussis (Tdap, Td) vaccine. You may need a Td booster every 10 years. Zoster vaccine. You may need this after age 47. Pneumococcal 13-valent conjugate (PCV13) vaccine. One dose is recommended after age 77. Pneumococcal polysaccharide (PPSV23) vaccine. One dose is recommended after age 71. Talk to your health care provider about which screenings and vaccines you need and how often you need them. This information is not intended to replace advice given to you by your health care provider. Make sure you discuss any questions you have with your health care provider. Document Released: 07/15/2015 Document Revised: 03/07/2016 Document Reviewed: 04/19/2015 Elsevier Interactive Patient Education  2017 [employer] Prevention in the Home Falls can cause injuries. They can happen to people of all ages. There are many things you can do to make your home safe and to help prevent falls. What can I do on the outside of my home? Regularly fix the edges of walkways and driveways and fix any cracks. Remove anything that might make you trip as you walk through a door, such as a raised step or threshold. Trim any bushes or trees on the path to your home. Use bright outdoor lighting. Clear any walking paths of anything that might make someone trip, such as rocks or tools. Regularly check to see if handrails are loose or broken. Make sure that both sides of any steps have handrails. Any raised decks and porches should have guardrails on the edges. Have any leaves, snow, or ice cleared regularly. Use sand or salt on walking paths during winter. Clean up any spills in your garage right away. This includes oil or grease spills. What can I do in the bathroom? Use night lights. Install grab bars by the toilet and in the tub and shower. Do not use towel bars as grab  bars. Use non-skid mats or decals in the tub or shower. If you need to sit down in the shower, use a plastic, non-slip stool. Keep the floor dry. Clean up any water that spills on the floor as soon as it happens. Remove soap buildup in the tub or shower regularly. Attach bath mats securely with double-sided non-slip rug tape. Do not have throw rugs and other things on the floor that can make you trip. What can I do in the bedroom? Use night lights. Make sure that you have a light by your bed that is easy to reach. Do not use any sheets or blankets that are too big for your bed. They should not hang down onto the floor. Have a firm chair that has side arms. You can use this for support while you get dressed. Do not have throw rugs and other things on the floor that can make you trip. What can I do in the kitchen? Clean up any spills right away. Avoid walking on wet floors. Keep items that you use a lot in easy-to-reach places. If you need to reach something above you, use a strong step stool that has a grab bar. Keep electrical cords out of the way. Do not use floor polish or wax that makes floors slippery. If you must use wax, use non-skid floor wax. Do not have throw rugs and other things on the floor that can make you trip. What  can I do with my stairs? Do not leave any items on the stairs. Make sure that there are handrails on both sides of the stairs and use them. Fix handrails that are broken or loose. Make sure that handrails are as long as the stairways. Check any carpeting to make sure that it is firmly attached to the stairs. Fix any carpet that is loose or worn. Avoid having throw rugs at the top or bottom of the stairs. If you do have throw rugs, attach them to the floor with carpet tape. Make sure that you have a light switch at the top of the stairs and the bottom of the stairs. If you do not have them, ask someone to add them for you. What else can I do to help prevent  falls? Wear shoes that: Do not have high heels. Have rubber bottoms. Are comfortable and fit you well. Are closed at the toe. Do not wear sandals. If you use a stepladder: Make sure that it is fully opened. Do not climb a closed stepladder. Make sure that both sides of the stepladder are locked into place. Ask someone to hold it for you, if possible. Clearly mark and make sure that you can see: Any grab bars or handrails. First and last steps. Where the edge of each step is. Use tools that help you move around (mobility aids) if they are needed. These include: Canes. Walkers. Scooters. Crutches. Turn on the lights when you go into a dark area. Replace any light bulbs as soon as they burn out. Set up your furniture so you have a clear path. Avoid moving your furniture around. If any of your floors are uneven, fix them. If there are any pets around you, be aware of where they are. Review your medicines with your doctor. Some medicines can make you feel dizzy. This can increase your chance of falling. Ask your doctor what other things that you can do to help prevent falls. This information is not intended to replace advice given to you by your health care provider. Make sure you discuss any questions you have with your health care provider. Document Released: 04/14/2009 Document Revised: 11/24/2015 Document Reviewed: 07/23/2014 Elsevier Interactive Patient Education  2017 [employer].

## 2021-02-13 NOTE — Progress Notes (Signed)
Subjective:   Kristen Munoz is a 75 y.o. female who presents for Medicare Annual (Subsequent) preventive examination.  Virtual Visit via Telephone Note  I connected with  Kristen Munoz on 02/13/21 at  3:30 PM EDT by telephone and verified that I am speaking with the correct person using two identifiers.  Location: Patient: Home Provider: [facility] Persons participating in the virtual visit: patient/Nurse Health Advisor   I discussed the limitations, risks, security and privacy concerns of performing an evaluation and management service by telephone and the availability of in person appointments. The patient expressed understanding and agreed to proceed.  Interactive audio and video telecommunications were attempted between this nurse and patient, however failed, due to patient having technical difficulties OR patient did not have access to video capability.  We continued and completed visit with audio only.  Some vital signs may be absent or patient reported.   Kristen Munoz E Kristen Gelles, LPN   Review of Systems     Cardiac Risk Factors include: advanced age (>64men, >65 women);dyslipidemia;smoking/ tobacco exposure;sedentary lifestyle;hypertension     Objective:    Today's Vitals   02/13/21 1518  Weight: 144 lb (65.3 kg)  Height: 5\' 3"  (1.6 m)   Body mass index is 25.51 kg/m.  Advanced Directives 02/13/2021 01/06/2020 12/04/2018 06/27/2017 10/07/2015 09/17/2014  Does Patient Have a Medical Advance Directive? No No No No No No  Does patient want to make changes to medical advance directive? - No - Patient declined - - - -  Would patient like information on creating a medical advance directive? No - Patient declined - No - Patient declined - Yes - Educational materials given No - patient declined information    Current Medications (verified) Outpatient Encounter Medications as of 02/13/2021  Medication Sig   aspirin EC 81 MG tablet Take 81 mg by mouth daily.   clonazePAM (KLONOPIN) 0.5 MG tablet  Take 1 tablet (0.5 mg total) by mouth 2 (two) times daily as needed.   dextromethorphan-guaiFENesin (MUCINEX DM) 30-600 MG 12hr tablet Take 1 tablet by mouth 2 (two) times daily.   doxycycline (VIBRA-TABS) 100 MG tablet Take 1 tablet (100 mg total) by mouth 2 (two) times daily.   escitalopram (LEXAPRO) 20 MG tablet Take 1 tablet (20 mg total) by mouth daily.   ferrous sulfate 325 (65 FE) MG tablet Take 1 tablet (325 mg total) by mouth daily with breakfast. (Needs to be seen before next refill)   levothyroxine (EUTHYROX) 25 MCG tablet TAKE 1 TABLET BY MOUTH ONCE DAILY BEFORE BREAKFAST   lisinopril-hydrochlorothiazide (ZESTORETIC) 10-12.5 MG tablet TAKE 1 TABLET BY MOUTH ONCE DAILY . APPOINTMENT REQUIRED FOR FUTURE REFILLS   loperamide (IMODIUM A-D) 2 MG tablet Take 1 tablet (2 mg total) by mouth 4 (four) times daily as needed for diarrhea or loose stools.   metoprolol tartrate (LOPRESSOR) 25 MG tablet Take 1/2 (one-half) tablet by mouth twice daily   pantoprazole (PROTONIX) 40 MG tablet Take 1 tablet (40 mg total) by mouth daily.   predniSONE (STERAPRED UNI-PAK 21 TAB) 10 MG (21) TBPK tablet Use as directed   simvastatin (ZOCOR) 40 MG tablet Take 1 tablet (40 mg total) by mouth daily.   No facility-administered encounter medications on file as of 02/13/2021.    Allergies (verified) Sulfa antibiotics   History: Past Medical History:  Diagnosis Date   CAD (coronary artery disease)    GERD (gastroesophageal reflux disease)    Hyperlipidemia    Hypertension    Insomnia  Osteoporosis    Syncopal episodes    Thyroid disease    Urinary bladder incontinence    Past Surgical History:  Procedure Laterality Date   ABDOMINAL HYSTERECTOMY     partial   bladder      baldder tack   THYROID SURGERY     VESICOVAGINAL FISTULA CLOSURE W/ TAH     Family History  Problem Relation Age of Onset   Cancer Mother        colon   Hypertension Mother    Diabetes Mother    Heart disease Mother     Cancer Father        lung   Diabetes Father    Hypertension Father    Heart disease Father    Cancer Sister        bone   Heart failure Sister    Birth defects Sister    Cancer Brother        kidney   Early death Brother        gun shot   Birth defects Brother    Heart failure Brother    Stevens-Johnson syndrome Sister    Anxiety disorder Sister    Depression Sister    Drug abuse Paternal Uncle    Thyroid disease Daughter    Heart attack Daughter    Diabetes Daughter    Social History   Socioeconomic History   Marital status: Widowed    Spouse name: Not on file   Number of children: 2   Years of education: 11th   Highest education level: Not on file  Occupational History   Occupation: RETIRED   Tobacco Use   Smoking status: Every Day    Packs/day: 1.00    Years: 38.00    Pack years: 38.00    Types: Cigarettes    Start date: 07/02/1974   Smokeless tobacco: Never  Vaping Use   Vaping Use: Never used  Substance and Sexual Activity   Alcohol use: No   Drug use: No   Sexual activity: Not Currently  Other Topics Concern   Not on file  Social History Narrative   Lives alone,   Right-handed.   Caffeine use: 2 cups coffee daily, 2-3 cans Pepsi daily   One daughter passed away, she has 2 daughters that live nearby   Social Determinants of Health   Financial Resource Strain: Low Risk    Difficulty of Paying Living Expenses: Not hard at all  Food Insecurity: No Food Insecurity   Worried About Programme researcher, broadcasting/film/video in the Last Year: Never true   Barista in the Last Year: Never true  Transportation Needs: No Transportation Needs   Lack of Transportation (Medical): No   Lack of Transportation (Non-Medical): No  Physical Activity: Inactive   Days of Exercise per Week: 0 days   Minutes of Exercise per Session: 0 min  Stress: Stress Concern Present   Feeling of Stress : Rather much  Social Connections: Socially Isolated   Frequency of Communication with  Friends and Family: More than three times a week   Frequency of Social Gatherings with Friends and Family: Once a week   Attends Religious Services: Never   Database administrator or Organizations: No   Attends Banker Meetings: Never   Marital Status: Widowed    Tobacco Counseling Ready to quit: No Counseling given: Yes   Clinical Intake:  Pre-visit preparation completed: Yes  Pain : No/denies pain  BMI - recorded: 25.51 Nutritional Status: BMI 25 -29 Overweight Nutritional Risks: None Diabetes: No  How often do you need to have someone help you when you read instructions, pamphlets, or other written materials from your doctor or pharmacy?: 1 - Never  Diabetic? No  Interpreter Needed?: No  Information entered by :: Hutson Luft, LPN   Activities of Daily Living In your present state of health, do you have any difficulty performing the following activities: 02/13/2021  Hearing? N  Vision? N  Difficulty concentrating or making decisions? N  Walking or climbing stairs? Y  Comment gets tired easily  Dressing or bathing? N  Doing errands, shopping? N  Preparing Food and eating ? N  Using the Toilet? N  In the past six months, have you accidently leaked urine? N  Do you have problems with loss of bowel control? N  Managing your Medications? N  Managing your Finances? N  Housekeeping or managing your Housekeeping? N  Some recent data might be hidden    Patient Care Team: Bennie PieriniMartin, Mary-Margaret, FNP as PCP - General (Nurse Practitioner) Michaelle CopasLe, Yen Thi Hong, MD as Referring Physician (Optometry)  Indicate any recent Medical Services you may have received from other than [facility] providers in the past year (date may be approximate).     Assessment:   This is a routine wellness examination for Lafonda MossesDiana.  Hearing/Vision screen Hearing Screening - Comments:: Denies hearing difficulties  Vision Screening - Comments:: Wears eyeglasses - behind on annual eye  exams with Dr Conley RollsLe in [place]  Dietary issues and exercise activities discussed: Current Exercise Habits: The patient does not participate in regular exercise at present   Goals Addressed             This Visit's Progress    Have 3 meals a day   No change    Pt had previously lost weight and is now trying to gain some back and stay at a healthly weight     Prevent falls   On track    Stay active Travel and visit family        Depression Screen PHQ 2/9 Scores 02/13/2021 12/01/2020 06/29/2020 01/19/2020 01/06/2020 08/03/2019 12/04/2018  PHQ - 2 Score 2 0 3 0 1 0 1  PHQ- 9 Score 9 - 9 - - - -    Fall Risk Fall Risk  02/13/2021 12/01/2020 06/29/2020 01/19/2020 01/06/2020  Falls in the past year? 0 0 0 1 1  Number falls in past yr: 0 - - 1 1  Injury with Fall? 0 - - 1 1  Risk for fall due to : Medication side effect - - History of fall(s) History of fall(s)  Follow up Falls prevention discussed - - Falls evaluation completed Falls prevention discussed    FALL RISK PREVENTION PERTAINING TO THE HOME:  Any stairs in or around the home? Yes  If so, are there any without handrails? No  Home free of loose throw rugs in walkways, pet beds, electrical cords, etc? Yes  Adequate lighting in your home to reduce risk of falls? Yes   ASSISTIVE DEVICES UTILIZED TO PREVENT FALLS:  Life alert? No  Use of a cane, walker or w/c? No  Grab bars in the bathroom? No  Shower chair or bench in shower? Yes  Elevated toilet seat or a handicapped toilet? Yes   TIMED UP AND GO:  Was the test performed? No . Telephonic visit  Cognitive Function: MMSE - Mini Mental State Exam 10/10/2016 10/10/2016  10/07/2015 09/17/2014  Orientation to time 5 5 5 5   Orientation to Place 5 5 5 5   Registration 3 - 3 3  Attention/ Calculation 4 4 3 5   Recall 2 - 3 3  Language- name 2 objects 2 2 2 2   Language- repeat 1 1 1 1   Language- follow 3 step command 3 3 3 3   Language- read & follow direction 1 1 1 1   Write a sentence 1 1  1 1   Copy design 0 0 1 1  Total score 27 - 28 30     6CIT Screen 02/13/2021 01/06/2020 12/04/2018  What Year? 0 points 0 points 0 points  What month? 0 points 0 points 0 points  What time? 0 points 0 points 0 points  Count back from 20 0 points 0 points 0 points  Months in reverse 0 points 0 points 0 points  Repeat phrase 2 points 0 points 0 points  Total Score 2 0 0    Immunizations Immunization History  Administered Date(s) Administered   Fluad Quad(high Dose 65+) 06/02/2019   Influenza, High Dose Seasonal PF 04/18/2016, 05/20/2017, 05/09/2018   Influenza,inj,Quad PF,6+ Mos 04/22/2013, 06/05/2014, 03/31/2015   Moderna Sars-Covid-2 Vaccination 11/04/2019, 12/04/2019   Pneumococcal Conjugate-13 10/11/2014   Pneumococcal Polysaccharide-23 05/03/2011   Zoster, Live 09/17/2014    TDAP status: Due, Education has been provided regarding the importance of this vaccine. Advised may receive this vaccine at local pharmacy or Health Dept. Aware to provide a copy of the vaccination record if obtained from local pharmacy or Health Dept. Verbalized acceptance and understanding.  Flu Vaccine status: Up to date  Pneumococcal vaccine status: Up to date  Covid-19 vaccine status: Completed vaccines  Qualifies for Shingles Vaccine? Yes   Zostavax completed Yes   Shingrix Completed?: No.    Education has been provided regarding the importance of this vaccine. Patient has been advised to call insurance company to determine out of pocket expense if they have not yet received this vaccine. Advised may also receive vaccine at local pharmacy or Health Dept. Verbalized acceptance and understanding.  Screening Tests Health Maintenance  Topic Date Due   Zoster Vaccines- Shingrix (1 of 2) Never done   MAMMOGRAM  05/07/2017   TETANUS/TDAP  04/11/2019   COVID-19 Vaccine (3 - Booster for Moderna series) 05/05/2020   INFLUENZA VACCINE  01/30/2021   DEXA SCAN  06/30/2022   COLONOSCOPY (Pts 45-62yrs  Insurance coverage will need to be confirmed)  09/04/2022   Hepatitis C Screening  Completed   PNA vac Low Risk Adult  Completed   HPV VACCINES  Aged Out    Health Maintenance  Health Maintenance Due  Topic Date Due   Zoster Vaccines- Shingrix (1 of 2) Never done   MAMMOGRAM  05/07/2017   TETANUS/TDAP  04/11/2019   COVID-19 Vaccine (3 - Booster for Moderna series) 05/05/2020   INFLUENZA VACCINE  01/30/2021    Colorectal cancer screening: Type of screening: Colonoscopy. Completed 09/03/2012. Repeat every 10 years  Mammogram status: No longer required due to age - and patient declines.  Bone Density status: Completed 06/30/2020. Results reflect: Bone density results: OSTEOPOROSIS. Repeat every 2 years.  Lung Cancer Screening: (Low Dose CT Chest recommended if Age 2-80 years, 30 pack-year currently smoking OR have quit w/in 15years.) does qualify.   Lung Cancer Screening Referral:  patient declines at this time  Additional Screening:  Hepatitis C Screening: does qualify; Completed 08/25/2015  Vision Screening: Recommended annual ophthalmology exams for early  detection of glaucoma and other disorders of the eye. Is the patient up to date with their annual eye exam?  No  Who is the provider or what is the name of the office in which the patient attends annual eye exams? [facility] in [place] If pt is not established with a provider, would they like to be referred to a provider to establish care? No .   Dental Screening: Recommended annual dental exams for proper oral hygiene  Community Resource Referral / Chronic Care Management: CRR required this visit?  No   CCM required this visit?  No      Plan:     I have personally reviewed and noted the following in the patient's chart:   Medical and social history Use of alcohol, tobacco or illicit drugs  Current medications and supplements including opioid prescriptions.  Functional ability and status Nutritional status Physical  activity Advanced directives List of other physicians Hospitalizations, surgeries, and ER visits in previous 12 months Vitals Screenings to include cognitive, depression, and falls Referrals and appointments  In addition, I have reviewed and discussed with patient certain preventive protocols, quality metrics, and best practice recommendations. A written personalized care plan for preventive services as well as general preventive health recommendations were provided to patient.     [place] Constable, LPN   1/[id number]/[id number]   Nurse Notes: None

## 2021-02-14 ENCOUNTER — Other Ambulatory Visit: Payer: Self-pay | Admitting: Nurse Practitioner

## 2021-02-14 DIAGNOSIS — I10 Essential (primary) hypertension: Secondary | ICD-10-CM

## 2021-02-14 DIAGNOSIS — K219 Gastro-esophageal reflux disease without esophagitis: Secondary | ICD-10-CM

## 2021-02-14 DIAGNOSIS — F411 Generalized anxiety disorder: Secondary | ICD-10-CM

## 2021-02-14 DIAGNOSIS — F3342 Major depressive disorder, recurrent, in full remission: Secondary | ICD-10-CM

## 2021-02-14 DIAGNOSIS — E782 Mixed hyperlipidemia: Secondary | ICD-10-CM

## 2021-02-15 NOTE — Telephone Encounter (Signed)
Patient last seen 06/2020. Patient needs to be seen for further refills

## 2021-02-16 ENCOUNTER — Other Ambulatory Visit: Payer: Self-pay | Admitting: Nurse Practitioner

## 2021-02-16 DIAGNOSIS — D509 Iron deficiency anemia, unspecified: Secondary | ICD-10-CM

## 2021-03-08 ENCOUNTER — Other Ambulatory Visit: Payer: Self-pay | Admitting: Nurse Practitioner

## 2021-03-08 DIAGNOSIS — I10 Essential (primary) hypertension: Secondary | ICD-10-CM

## 2021-03-08 DIAGNOSIS — K219 Gastro-esophageal reflux disease without esophagitis: Secondary | ICD-10-CM

## 2021-04-29 ENCOUNTER — Other Ambulatory Visit: Payer: Self-pay | Admitting: Nurse Practitioner

## 2021-04-29 DIAGNOSIS — E782 Mixed hyperlipidemia: Secondary | ICD-10-CM

## 2021-04-29 DIAGNOSIS — I10 Essential (primary) hypertension: Secondary | ICD-10-CM

## 2021-04-29 DIAGNOSIS — D509 Iron deficiency anemia, unspecified: Secondary | ICD-10-CM

## 2021-05-01 NOTE — Telephone Encounter (Signed)
MMM 30 days given 03/09/21

## 2021-05-22 ENCOUNTER — Telehealth: Payer: Self-pay | Admitting: Nurse Practitioner

## 2021-05-22 NOTE — Telephone Encounter (Signed)
Recording says ph# dialed is not in service, tried twice Pt NTBS, last appt for check-up on chronic conditions was 06/29/20, was to RTC in 6 mos.

## 2021-05-22 NOTE — Telephone Encounter (Signed)
Pt called back in & switchboard made pt appt for next week.

## 2021-05-29 ENCOUNTER — Encounter: Payer: Self-pay | Admitting: Nurse Practitioner

## 2021-05-29 ENCOUNTER — Ambulatory Visit (INDEPENDENT_AMBULATORY_CARE_PROVIDER_SITE_OTHER): Payer: PPO | Admitting: Nurse Practitioner

## 2021-05-29 VITALS — BP 129/72 | HR 60 | Temp 97.5°F | Resp 20 | Ht 63.0 in | Wt 144.0 lb

## 2021-05-29 DIAGNOSIS — E039 Hypothyroidism, unspecified: Secondary | ICD-10-CM | POA: Diagnosis not present

## 2021-05-29 DIAGNOSIS — K219 Gastro-esophageal reflux disease without esophagitis: Secondary | ICD-10-CM | POA: Diagnosis not present

## 2021-05-29 DIAGNOSIS — I10 Essential (primary) hypertension: Secondary | ICD-10-CM

## 2021-05-29 DIAGNOSIS — N393 Stress incontinence (female) (male): Secondary | ICD-10-CM

## 2021-05-29 DIAGNOSIS — F3342 Major depressive disorder, recurrent, in full remission: Secondary | ICD-10-CM

## 2021-05-29 DIAGNOSIS — M810 Age-related osteoporosis without current pathological fracture: Secondary | ICD-10-CM

## 2021-05-29 DIAGNOSIS — F411 Generalized anxiety disorder: Secondary | ICD-10-CM

## 2021-05-29 DIAGNOSIS — E782 Mixed hyperlipidemia: Secondary | ICD-10-CM

## 2021-05-29 DIAGNOSIS — F5101 Primary insomnia: Secondary | ICD-10-CM

## 2021-05-29 DIAGNOSIS — D509 Iron deficiency anemia, unspecified: Secondary | ICD-10-CM | POA: Diagnosis not present

## 2021-05-29 DIAGNOSIS — I2510 Atherosclerotic heart disease of native coronary artery without angina pectoris: Secondary | ICD-10-CM

## 2021-05-29 DIAGNOSIS — Z23 Encounter for immunization: Secondary | ICD-10-CM

## 2021-05-29 MED ORDER — METOPROLOL TARTRATE 25 MG PO TABS: ORAL_TABLET | ORAL | 1 refills | Status: DC

## 2021-05-29 MED ORDER — LEVOTHYROXINE SODIUM 25 MCG PO TABS: ORAL_TABLET | ORAL | 1 refills | Status: DC

## 2021-05-29 MED ORDER — PANTOPRAZOLE SODIUM 40 MG PO TBEC: 40.0000 mg | DELAYED_RELEASE_TABLET | Freq: Every day | ORAL | 1 refills | Status: DC

## 2021-05-29 MED ORDER — LISINOPRIL-HYDROCHLOROTHIAZIDE 10-12.5 MG PO TABS: ORAL_TABLET | ORAL | 1 refills | Status: DC

## 2021-05-29 MED ORDER — FERROUS SULFATE 325 (65 FE) MG PO TABS: 325.0000 mg | ORAL_TABLET | Freq: Every day | ORAL | 1 refills | Status: DC

## 2021-05-29 MED ORDER — SIMVASTATIN 40 MG PO TABS: 40.0000 mg | ORAL_TABLET | Freq: Every day | ORAL | 1 refills | Status: DC

## 2021-05-29 NOTE — Progress Notes (Signed)
Subjective:    Patient ID: Kristen Munoz, female    DOB: Feb 24, 1946, 75 y.o.   MRN: [id number]  Chief Complaint: Medical Management of Chronic Issues    HPI:  1. Essential hypertension, benign No c/o chest pain, sob or headache. Doe snot check blood pressure at home. BP Readings from Last 3 Encounters:  05/29/21 129/72  12/01/20 135/68  06/29/20 (!) 144/88     2. Coronary artery disease involving native coronary artery of native heart without angina pectoris Has not seen cardiology in several years.  3. Acquired hypothyroidism No problems that aware of. Lab Results  Component Value Date   TSH 1.020 06/29/2020    4. Mixed hyperlipidemia Does try to watch diet and does very little exercise.refuses statin Lab Results  Component Value Date   CHOL 225 (H) 06/29/2020   HDL 53 06/29/2020   LDLCALC 133 (H) 06/29/2020   TRIG 218 (H) 06/29/2020   CHOLHDL 4.2 06/29/2020     5. GAD (generalized anxiety disorder) Use to be on klonopin and is no longer taking. Says she is doing well. GAD 7 : Generalized Anxiety Score 05/29/2021 06/29/2020 10/11/2016  Nervous, Anxious, on Edge 0 1 2  Control/stop worrying 0 3 2  Worry too much - different things 0 - 1  Trouble relaxing 0 1 1  Restless 0 1 1  Easily annoyed or irritable 0 1 2  Afraid - awful might happen 0 0 1  Total GAD 7 Score 0 - 10  Anxiety Difficulty Not difficult at all Somewhat difficult Very difficult      6. Recurrent major depressive disorder, in full remission ([place]) Use to be on lexapro but says she no longer needs it so she stopped taking. Depression screen [facility] 2/9 05/29/2021 02/13/2021 12/01/2020  Decreased Interest 0 1 0  Down, Depressed, Hopeless 0 1 0  PHQ - 2 Score 0 2 0  Altered sleeping 0 3 -  Tired, decreased energy 0 3 -  Change in appetite 0 1 -  Feeling bad or failure about yourself  0 0 -  Trouble concentrating 0 0 -  Moving slowly or fidgety/restless 0 0 -  Suicidal thoughts 0 0 -  PHQ-9  Score 0 9 -  Difficult doing work/chores Not difficult at all Somewhat difficult -  Some recent data might be hidden     7. Primary insomnia Sleeps well. Sleeps about 6-7 hours at a time.  8. Stress incontinence, female Not taking any meds.says she is doing well without.  9. Age-related osteoporosis without current pathological fracture Last dexascan was done 06/29/20. T score was -2.2    Outpatient Encounter Medications as of 05/29/2021  Medication Sig   aspirin EC 81 MG tablet Take 81 mg by mouth daily.   ferrous sulfate 325 (65 FE) MG tablet Take 1 tablet (325 mg total) by mouth daily with breakfast. (NEEDS TO BE SEEN BEFORE NEXT REFILL)   levothyroxine (EUTHYROX) 25 MCG tablet TAKE 1 TABLET BY MOUTH ONCE DAILY BEFORE BREAKFAST   lisinopril-hydrochlorothiazide (ZESTORETIC) 10-12.5 MG tablet TAKE 1 TABLET BY MOUTH ONCE DAILY (NEEDS TO BE SEEN BEFORE NEXT REFILL)   loperamide (IMODIUM A-D) 2 MG tablet Take 1 tablet (2 mg total) by mouth 4 (four) times daily as needed for diarrhea or loose stools.   metoprolol tartrate (LOPRESSOR) 25 MG tablet Take 1/2 (one-half) tablet by mouth twice daily   pantoprazole (PROTONIX) 40 MG tablet Take 1 tablet (40 mg total) by mouth daily. (NEEDS TO  BE SEEN BEFORE NEXT REFILL)   simvastatin (ZOCOR) 40 MG tablet Take 1 tablet (40 mg total) by mouth daily.   [DISCONTINUED] clonazePAM (KLONOPIN) 0.5 MG tablet Take 1 tablet (0.5 mg total) by mouth 2 (two) times daily as needed.   [DISCONTINUED] dextromethorphan-guaiFENesin (MUCINEX DM) 30-600 MG 12hr tablet Take 1 tablet by mouth 2 (two) times daily.   [DISCONTINUED] doxycycline (VIBRA-TABS) 100 MG tablet Take 1 tablet (100 mg total) by mouth 2 (two) times daily.   [DISCONTINUED] escitalopram (LEXAPRO) 20 MG tablet Take 1 tablet (20 mg total) by mouth daily.   [DISCONTINUED] predniSONE (STERAPRED UNI-PAK 21 TAB) 10 MG (21) TBPK tablet Use as directed   No facility-administered encounter medications on  file as of 05/29/2021.    Past Surgical History:  Procedure Laterality Date   ABDOMINAL HYSTERECTOMY     partial   bladder      baldder tack   THYROID SURGERY     VESICOVAGINAL FISTULA CLOSURE W/ TAH      Family History  Problem Relation Age of Onset   Cancer Mother        colon   Hypertension Mother    Diabetes Mother    Heart disease Mother    Cancer Father        lung   Diabetes Father    Hypertension Father    Heart disease Father    Cancer Sister        bone   Heart failure Sister    Birth defects Sister    Cancer Brother        kidney   Early death Brother        gun shot   Birth defects Brother    Heart failure Brother    Stevens-Johnson syndrome Sister    Anxiety disorder Sister    Depression Sister    Drug abuse Paternal Uncle    Thyroid disease Daughter    Heart attack Daughter    Diabetes Daughter     New complaints: None today  Social history: Lives by herself. Family check son her daily  Controlled substance contract: n/a     Review of Systems  Constitutional:  Negative for diaphoresis.  Eyes:  Negative for pain.  Respiratory:  Negative for shortness of breath.   Cardiovascular:  Negative for chest pain, palpitations and leg swelling.  Gastrointestinal:  Negative for abdominal pain.  Endocrine: Negative for polydipsia.  Skin:  Negative for rash.  Neurological:  Negative for dizziness, weakness and headaches.  Hematological:  Does not bruise/bleed easily.  All other systems reviewed and are negative.     Objective:   Physical Exam Vitals and nursing note reviewed.  Constitutional:      General: She is not in acute distress.    Appearance: Normal appearance. She is well-developed.  HENT:     Head: Normocephalic.     Right Ear: Tympanic membrane normal.     Left Ear: Tympanic membrane normal.     Nose: Nose normal.     Mouth/Throat:     Mouth: Mucous membranes are moist.  Eyes:     Pupils: Pupils are equal, round, and  reactive to light.  Neck:     Vascular: No carotid bruit or JVD.  Cardiovascular:     Rate and Rhythm: Normal rate and regular rhythm.     Heart sounds: Normal heart sounds.  Pulmonary:     Effort: Pulmonary effort is normal. No respiratory distress.     Breath sounds:  Normal breath sounds. No wheezing or rales.  Chest:     Chest wall: No tenderness.  Abdominal:     General: Bowel sounds are normal. There is no distension or abdominal bruit.     Palpations: Abdomen is soft. There is no hepatomegaly, splenomegaly, mass or pulsatile mass.     Tenderness: There is no abdominal tenderness.  Musculoskeletal:        General: Normal range of motion.     Cervical back: Normal range of motion and neck supple.  Lymphadenopathy:     Cervical: No cervical adenopathy.  Skin:    General: Skin is warm and dry.  Neurological:     Mental Status: She is alert and oriented to person, place, and time.     Deep Tendon Reflexes: Reflexes are normal and symmetric.  Psychiatric:        Behavior: Behavior normal.        Thought Content: Thought content normal.        Judgment: Judgment normal.    BP 129/72   Pulse 60   Temp (!) 97.5 F (36.4 C) (Temporal)   Resp 20   Ht _0  (1.6 m)   Wt 144 lb (65.3 kg)   SpO2 97%   BMI 25.51 kg/m        Assessment & Plan:  Kristen Munoz comes in today with chief complaint of Medical Management of Chronic Issues   Diagnosis and orders addressed:  1. Essential hypertension, benign Low sodium diet - lisinopril-hydrochlorothiazide (ZESTORETIC) 10-12.5 MG tablet; TAKE 1 TABLET BY MOUTH ONCE DAILY (NEEDS TO BE SEEN BEFORE NEXT REFILL)  Dispense: 90 tablet; Refill: 1 - metoprolol tartrate (LOPRESSOR) 25 MG tablet; Take 1/2 (one-half) tablet by mouth twice daily  Dispense: 90 tablet; Refill: 1 - CBC with Differential/Platelet - CMP14+EGFR  2. Coronary artery disease involving native coronary artery of native heart without angina pectoris Encouraged to see  cardiology  3. Acquired hypothyroidism Labs pending - Thyroid Panel With TSH  4. Mixed hyperlipidemia Low fat diet - simvastatin (ZOCOR) 40 MG tablet; Take 1 tablet (40 mg total) by mouth daily.  Dispense: 90 tablet; Refill: 1 - Lipid panel  5. GAD (generalized anxiety disorder) Stress management  6. Recurrent major depressive disorder, in full remission ([place])  7. Primary insomnia Bedtime routine  8. Stress incontinence, female   42. Age-related osteoporosis without current pathological fracture Weight bearing exercises  10. Gastroesophageal reflux disease Avoid spicy foods Do not eat 2 hours prior to bedtime  - pantoprazole (PROTONIX) 40 MG tablet; Take 1 tablet (40 mg total) by mouth daily. (NEEDS TO BE SEEN BEFORE NEXT REFILL)  Dispense: 90 tablet; Refill: 1  11. Hypothyroidism - levothyroxine (EUTHYROX) 25 MCG tablet; TAKE 1 TABLET BY MOUTH ONCE DAILY BEFORE BREAKFAST  Dispense: 90 tablet; Refill: 1  12. Iron deficiency anemia, unspecified iron deficiency anemia type Labs oenidng - ferrous sulfate 325 (65 FE) MG tablet; Take 1 tablet (325 mg total) by mouth daily with breakfast. (NEEDS TO BE SEEN BEFORE NEXT REFILL)  Dispense: 90 tablet; Refill: 1   Labs pending Health Maintenance reviewed Diet and exercise encouraged  Follow up plan: 6 months   Mary-Margaret Hassell Done, FNP

## 2021-05-29 NOTE — Patient Instructions (Signed)

## 2021-05-30 LAB — CBC WITH DIFFERENTIAL/PLATELET
Basophils Absolute: 0.1 10*3/uL (ref 0.0–0.2)
Basos: 1 %
EOS (ABSOLUTE): 0.1 10*3/uL (ref 0.0–0.4)
Eos: 1 %
Hematocrit: 34.4 % (ref 34.0–46.6)
Hemoglobin: 11.9 g/dL (ref 11.1–15.9)
Immature Grans (Abs): 0 10*3/uL (ref 0.0–0.1)
Immature Granulocytes: 0 %
Lymphocytes Absolute: 3.3 10*3/uL — ABNORMAL HIGH (ref 0.7–3.1)
Lymphs: 37 %
MCH: 31.3 pg (ref 26.6–33.0)
MCHC: 34.6 g/dL (ref 31.5–35.7)
MCV: 91 fL (ref 79–97)
Monocytes Absolute: 0.8 10*3/uL (ref 0.1–0.9)
Monocytes: 9 %
Neutrophils Absolute: 4.7 10*3/uL (ref 1.4–7.0)
Neutrophils: 52 %
Platelets: 211 10*3/uL (ref 150–450)
RBC: 3.8 x10E6/uL (ref 3.77–5.28)
RDW: 13 % (ref 11.7–15.4)
WBC: 8.9 10*3/uL (ref 3.4–10.8)

## 2021-05-30 LAB — CMP14+EGFR
ALT: 6 IU/L (ref 0–32)
AST: 10 IU/L (ref 0–40)
Albumin/Globulin Ratio: 1.6 (ref 1.2–2.2)
Albumin: 4.3 g/dL (ref 3.7–4.7)
Alkaline Phosphatase: 66 IU/L (ref 44–121)
BUN/Creatinine Ratio: 15 (ref 12–28)
BUN: 16 mg/dL (ref 8–27)
Bilirubin Total: 0.4 mg/dL (ref 0.0–1.2)
CO2: 26 mmol/L (ref 20–29)
Calcium: 9.3 mg/dL (ref 8.7–10.3)
Chloride: 103 mmol/L (ref 96–106)
Creatinine, Ser: 1.05 mg/dL — ABNORMAL HIGH (ref 0.57–1.00)
Globulin, Total: 2.7 g/dL (ref 1.5–4.5)
Glucose: 90 mg/dL (ref 70–99)
Potassium: 4.2 mmol/L (ref 3.5–5.2)
Sodium: 139 mmol/L (ref 134–144)
Total Protein: 7 g/dL (ref 6.0–8.5)
eGFR: 55 mL/min/{1.73_m2} — ABNORMAL LOW (ref >59)

## 2021-05-30 LAB — LIPID PANEL
Chol/HDL Ratio: 4.5 ratio — ABNORMAL HIGH (ref 0.0–4.4)
Cholesterol, Total: 217 mg/dL — ABNORMAL HIGH (ref 100–199)
HDL: 48 mg/dL (ref >39)
LDL Chol Calc (NIH): 140 mg/dL — ABNORMAL HIGH (ref 0–99)
Triglycerides: 160 mg/dL — ABNORMAL HIGH (ref 0–149)
VLDL Cholesterol Cal: 29 mg/dL (ref 5–40)

## 2021-05-30 LAB — THYROID PANEL WITH TSH
Free Thyroxine Index: 2.9 (ref 1.2–4.9)
T3 Uptake Ratio: 31 % (ref 24–39)
T4, Total: 9.4 ug/dL (ref 4.5–12.0)
TSH: 1.09 u[IU]/mL (ref 0.450–4.500)

## 2021-06-05 ENCOUNTER — Other Ambulatory Visit: Payer: Self-pay | Admitting: Nurse Practitioner

## 2021-06-05 MED ORDER — ROSUVASTATIN CALCIUM 10 MG PO TABS: 10.0000 mg | ORAL_TABLET | Freq: Every day | ORAL | 11 refills | Status: DC

## 2021-06-05 NOTE — Progress Notes (Signed)
Or  

## 2021-09-26 DIAGNOSIS — H2513 Age-related nuclear cataract, bilateral: Secondary | ICD-10-CM | POA: Diagnosis not present

## 2021-11-07 ENCOUNTER — Telehealth: Payer: Self-pay | Admitting: *Deleted

## 2021-11-07 NOTE — Telephone Encounter (Signed)
Pt is due for prolia - I want to talk with pt before ordering the med  - please get Roselyn Reef or Abigail Butts for this pt when she calls back - jhb 5/9 ?

## 2021-11-21 DIAGNOSIS — H25043 Posterior subcapsular polar age-related cataract, bilateral: Secondary | ICD-10-CM | POA: Diagnosis not present

## 2021-11-21 DIAGNOSIS — H2512 Age-related nuclear cataract, left eye: Secondary | ICD-10-CM | POA: Diagnosis not present

## 2021-11-21 DIAGNOSIS — H18413 Arcus senilis, bilateral: Secondary | ICD-10-CM | POA: Diagnosis not present

## 2021-11-21 DIAGNOSIS — H25013 Cortical age-related cataract, bilateral: Secondary | ICD-10-CM | POA: Diagnosis not present

## 2021-11-21 DIAGNOSIS — H2513 Age-related nuclear cataract, bilateral: Secondary | ICD-10-CM | POA: Diagnosis not present

## 2021-11-21 DIAGNOSIS — H18593 Other hereditary corneal dystrophies, bilateral: Secondary | ICD-10-CM | POA: Diagnosis not present

## 2021-11-21 NOTE — Telephone Encounter (Signed)
Patient cost is $301 

## 2021-11-28 ENCOUNTER — Ambulatory Visit (INDEPENDENT_AMBULATORY_CARE_PROVIDER_SITE_OTHER): Payer: PPO | Admitting: Nurse Practitioner

## 2021-11-28 ENCOUNTER — Ambulatory Visit: Payer: PPO | Admitting: Nurse Practitioner

## 2021-11-28 ENCOUNTER — Encounter: Payer: Self-pay | Admitting: Nurse Practitioner

## 2021-11-28 VITALS — BP 132/78 | HR 95 | Temp 97.3°F | Resp 20 | Ht 63.0 in | Wt 143.0 lb

## 2021-11-28 DIAGNOSIS — F3342 Major depressive disorder, recurrent, in full remission: Secondary | ICD-10-CM

## 2021-11-28 DIAGNOSIS — E782 Mixed hyperlipidemia: Secondary | ICD-10-CM

## 2021-11-28 DIAGNOSIS — K219 Gastro-esophageal reflux disease without esophagitis: Secondary | ICD-10-CM | POA: Diagnosis not present

## 2021-11-28 DIAGNOSIS — N393 Stress incontinence (female) (male): Secondary | ICD-10-CM | POA: Diagnosis not present

## 2021-11-28 DIAGNOSIS — D509 Iron deficiency anemia, unspecified: Secondary | ICD-10-CM

## 2021-11-28 DIAGNOSIS — M810 Age-related osteoporosis without current pathological fracture: Secondary | ICD-10-CM | POA: Diagnosis not present

## 2021-11-28 DIAGNOSIS — F5101 Primary insomnia: Secondary | ICD-10-CM | POA: Diagnosis not present

## 2021-11-28 DIAGNOSIS — F411 Generalized anxiety disorder: Secondary | ICD-10-CM

## 2021-11-28 DIAGNOSIS — I2510 Atherosclerotic heart disease of native coronary artery without angina pectoris: Secondary | ICD-10-CM | POA: Diagnosis not present

## 2021-11-28 DIAGNOSIS — I10 Essential (primary) hypertension: Secondary | ICD-10-CM | POA: Diagnosis not present

## 2021-11-28 DIAGNOSIS — E039 Hypothyroidism, unspecified: Secondary | ICD-10-CM | POA: Diagnosis not present

## 2021-11-28 MED ORDER — FERROUS SULFATE 325 (65 FE) MG PO TABS: 325.0000 mg | ORAL_TABLET | Freq: Every day | ORAL | 1 refills | Status: DC

## 2021-11-28 MED ORDER — LISINOPRIL-HYDROCHLOROTHIAZIDE 10-12.5 MG PO TABS: ORAL_TABLET | ORAL | 1 refills | Status: DC

## 2021-11-28 MED ORDER — PANTOPRAZOLE SODIUM 40 MG PO TBEC: 40.0000 mg | DELAYED_RELEASE_TABLET | Freq: Every day | ORAL | 1 refills | Status: DC

## 2021-11-28 MED ORDER — CITALOPRAM HYDROBROMIDE 20 MG PO TABS: 20.0000 mg | ORAL_TABLET | Freq: Every day | ORAL | 5 refills | Status: DC

## 2021-11-28 MED ORDER — ROSUVASTATIN CALCIUM 10 MG PO TABS: 10.0000 mg | ORAL_TABLET | Freq: Every day | ORAL | 1 refills | Status: DC

## 2021-11-28 MED ORDER — METOPROLOL TARTRATE 25 MG PO TABS: ORAL_TABLET | ORAL | 1 refills | Status: DC

## 2021-11-28 MED ORDER — LEVOTHYROXINE SODIUM 25 MCG PO TABS: ORAL_TABLET | ORAL | 1 refills | Status: DC

## 2021-11-28 NOTE — Patient Instructions (Signed)

## 2021-11-28 NOTE — Progress Notes (Addendum)
Subjective:    Patient ID: Kristen Munoz, female    DOB: 09-Sep-1945, 76 y.o.   MRN: [id number]   Chief Complaint: medical management of chronic issues     HPI:  Kristen Munoz is a 76 y.o. who identifies as a female who was assigned female at birth.   Social history: Lives with: by hrself Work history: retired   Scientist, forensic in today for follow up of the following chronic medical issues:  1. Essential hypertension, benign No c/o chest pain, sob or headache. Does not check her blood pressure at home. BP Readings from Last 3 Encounters:  05/29/21 129/72  12/01/20 135/68  06/29/20 (!) 144/88     2. Coronary artery disease involving native coronary artery of native heart without angina pectoris Has not seen cardiologist.   3. Mixed hyperlipidemia Tries to watch diet but does little to no dedicated exercise. Lab Results  Component Value Date   CHOL 217 (H) 05/29/2021   HDL 48 05/29/2021   LDLCALC 140 (H) 05/29/2021   TRIG 160 (H) 05/29/2021   CHOLHDL 4.5 (H) 05/29/2021     4. Gastroesophageal reflux disease, unspecified whether esophagitis present Is on protonix daily and is doing well  5. Acquired hypothyroidism No problems that she is aware of. Lab Results  Component Value Date   TSH 1.090 05/29/2021     6. Stress incontinence, female Is currently on no prescription meds for this. She says she is doing well right now.  7. Recurrent major depressive disorder, in full remission ([place]) Says she is doing well on no medication.    11/28/2021    1:54 PM 05/29/2021   11:43 AM 02/13/2021    3:24 PM  Depression screen PHQ 2/9  Decreased Interest 1 0 1  Down, Depressed, Hopeless 0 0 1  PHQ - 2 Score 1 0 2  Altered sleeping 1 0 3  Tired, decreased energy 1 0 3  Change in appetite 1 0 1  Feeling bad or failure about yourself  1 0 0  Trouble concentrating 0 0 0  Moving slowly or fidgety/restless 1 0 0  Suicidal thoughts 1 0 0  PHQ-9 Score 7 0 9  Difficult doing  work/chores Somewhat difficult Not difficult at all Somewhat difficult     8. GAD (generalized anxiety disorder) On  no prescription medication. Says she has been doing well.    11/28/2021    1:54 PM 05/29/2021   11:43 AM 06/29/2020    3:30 PM 10/11/2016    5:28 PM  GAD 7 : Generalized Anxiety Score  Nervous, Anxious, on Edge 2 0 1 2  Control/stop worrying 2 0 3 2  Worry too much - different things 2 0  1  Trouble relaxing 2 0 1 1  Restless 2 0 1 1  Easily annoyed or irritable 2 0 1 2  Afraid - awful might happen 2 0 0 1  Total GAD 7 Score 14 0  10  Anxiety Difficulty Somewhat difficult Not difficult at all Somewhat difficult Very difficult      9. Primary insomnia Has been sleeping well. Sleeping about 8 hours a night  10. Age-related osteoporosis without current pathological fracture Last dexascan was done on 07/01/20. Her t score was -1.9.    New complaints: None today  Allergies  Allergen Reactions   Sulfa Antibiotics Nausea Only   Outpatient Encounter Medications as of 11/28/2021  Medication Sig   aspirin EC 81 MG tablet Take 81 mg by  mouth daily.   ferrous sulfate 325 (65 FE) MG tablet Take 1 tablet (325 mg total) by mouth daily with breakfast. (NEEDS TO BE SEEN BEFORE NEXT REFILL)   levothyroxine (EUTHYROX) 25 MCG tablet TAKE 1 TABLET BY MOUTH ONCE DAILY BEFORE BREAKFAST   lisinopril-hydrochlorothiazide (ZESTORETIC) 10-12.5 MG tablet TAKE 1 TABLET BY MOUTH ONCE DAILY (NEEDS TO BE SEEN BEFORE NEXT REFILL)   loperamide (IMODIUM A-D) 2 MG tablet Take 1 tablet (2 mg total) by mouth 4 (four) times daily as needed for diarrhea or loose stools.   metoprolol tartrate (LOPRESSOR) 25 MG tablet Take 1/2 (one-half) tablet by mouth twice daily   pantoprazole (PROTONIX) 40 MG tablet Take 1 tablet (40 mg total) by mouth daily. (NEEDS TO BE SEEN BEFORE NEXT REFILL)   rosuvastatin (CRESTOR) 10 MG tablet Take 1 tablet (10 mg total) by mouth daily.   simvastatin (ZOCOR) 40 MG  tablet Take 1 tablet (40 mg total) by mouth daily.   No facility-administered encounter medications on file as of 11/28/2021.    Past Surgical History:  Procedure Laterality Date   ABDOMINAL HYSTERECTOMY     partial   bladder      baldder tack   THYROID SURGERY     VESICOVAGINAL FISTULA CLOSURE W/ TAH      Family History  Problem Relation Age of Onset   Cancer Mother        colon   Hypertension Mother    Diabetes Mother    Heart disease Mother    Cancer Father        lung   Diabetes Father    Hypertension Father    Heart disease Father    Cancer Sister        bone   Heart failure Sister    Birth defects Sister    Cancer Brother        kidney   Early death Brother        gun shot   Birth defects Brother    Heart failure Brother    Stevens-Johnson syndrome Sister    Anxiety disorder Sister    Depression Sister    Drug abuse Paternal Uncle    Thyroid disease Daughter    Heart attack Daughter    Diabetes Daughter       Controlled substance contract: n/a     Review of Systems  Constitutional:  Negative for diaphoresis.  Eyes:  Negative for pain.  Respiratory:  Negative for shortness of breath.   Cardiovascular:  Negative for chest pain, palpitations and leg swelling.  Gastrointestinal:  Negative for abdominal pain.  Endocrine: Negative for polydipsia.  Skin:  Negative for rash.  Neurological:  Negative for dizziness, weakness and headaches.  Hematological:  Does not bruise/bleed easily.  All other systems reviewed and are negative.     Objective:   Physical Exam Vitals and nursing note reviewed.  Constitutional:      General: She is not in acute distress.    Appearance: Normal appearance. She is well-developed.  HENT:     Head: Normocephalic.     Right Ear: Tympanic membrane normal.     Left Ear: Tympanic membrane normal.     Nose: Nose normal.     Mouth/Throat:     Mouth: Mucous membranes are moist.  Eyes:     Pupils: Pupils are equal, round,  and reactive to light.  Neck:     Vascular: No carotid bruit or JVD.  Cardiovascular:     Rate and  Rhythm: Normal rate and regular rhythm.     Heart sounds: Normal heart sounds.  Pulmonary:     Effort: Pulmonary effort is normal. No respiratory distress.     Breath sounds: Normal breath sounds. No wheezing or rales.  Chest:     Chest wall: No tenderness.  Abdominal:     General: Bowel sounds are normal. There is no distension or abdominal bruit.     Palpations: Abdomen is soft. There is no hepatomegaly, splenomegaly, mass or pulsatile mass.     Tenderness: There is no abdominal tenderness.  Musculoskeletal:        General: Normal range of motion.     Cervical back: Normal range of motion and neck supple.  Lymphadenopathy:     Cervical: No cervical adenopathy.  Skin:    General: Skin is warm and dry.  Neurological:     Mental Status: She is alert and oriented to person, place, and time.     Deep Tendon Reflexes: Reflexes are normal and symmetric.  Psychiatric:        Behavior: Behavior normal.        Thought Content: Thought content normal.        Judgment: Judgment normal.    BP 132/78   Pulse 95   Temp (!) 97.3 F (36.3 C) (Temporal)   Resp 20   Ht _0  (1.6 m)   Wt 143 lb (64.9 kg)   SpO2 94%   BMI 25.33 kg/m        Assessment & Plan:  MIKAIAH STOFFER comes in today with chief complaint of Medical Management of Chronic Issues   Diagnosis and orders addressed:  1. Essential hypertension, benign Low sodium diet - metoprolol tartrate (LOPRESSOR) 25 MG tablet; Take 1/2 (one-half) tablet by mouth twice daily  Dispense: 90 tablet; Refill: 1 - lisinopril-hydrochlorothiazide (ZESTORETIC) 10-12.5 MG tablet; TAKE 1 TABLET BY MOUTH ONCE DAILY (NEEDS TO BE SEEN BEFORE NEXT REFILL)  Dispense: 90 tablet; Refill: 1 - CBC with Differential/Platelet - CMP14+EGFR  2. Coronary artery disease involving native coronary artery of native heart without angina pectoris  3. Mixed  hyperlipidemia Low fat diet - rosuvastatin (CRESTOR) 10 MG tablet; Take 1 tablet (10 mg total) by mouth daily.  Dispense: 90 tablet; Refill: 1 - Lipid panel  4. Gastroesophageal reflux disease, unspecified whether esophagitis present Avoid spicy foods Do not eat 2 hours prior to bedtime - pantoprazole (PROTONIX) 40 MG tablet; Take 1 tablet (40 mg total) by mouth daily. (NEEDS TO BE SEEN BEFORE NEXT REFILL)  Dispense: 90 tablet; Refill: 1  5. Acquired hypothyroidism Labs pending - levothyroxine (EUTHYROX) 25 MCG tablet; TAKE 1 TABLET BY MOUTH ONCE DAILY BEFORE BREAKFAST  Dispense: 90 tablet; Refill: 1 - Thyroid Panel With TSH  6. Stress incontinence, female  7. Recurrent major depressive disorder, in full remission ([place]) Stress management Added celexa 20 mg 1 po daily  8. GAD (generalized anxiety disorder)  9. Primary insomnia Bedtime routine  10. Age-related osteoporosis without current pathological fracture Weight bearing exercises  11. Iron deficiency anemia, unspecified iron deficiency anemia type Labs pending - ferrous sulfate 325 (65 FE) MG tablet; Take 1 tablet (325 mg total) by mouth daily with breakfast. (NEEDS TO BE SEEN BEFORE NEXT REFILL)  Dispense: 90 tablet; Refill: 1   Labs pending Health Maintenance reviewed Diet and exercise encouraged  Follow up plan: 6 months   Mary-Margaret Hassell Done, FNP

## 2021-11-28 NOTE — Addendum Note (Signed)
Addended by: [name] on: 11/28/2021 02:24 PM   Modules accepted: Orders

## 2021-11-29 LAB — THYROID PANEL WITH TSH
Free Thyroxine Index: 2.5 (ref 1.2–4.9)
T3 Uptake Ratio: 27 % (ref 24–39)
T4, Total: 9.4 ug/dL (ref 4.5–12.0)
TSH: 0.861 u[IU]/mL (ref 0.450–4.500)

## 2021-11-29 LAB — CBC WITH DIFFERENTIAL/PLATELET
Basophils Absolute: 0.1 10*3/uL (ref 0.0–0.2)
Basos: 1 %
EOS (ABSOLUTE): 0.1 10*3/uL (ref 0.0–0.4)
Eos: 1 %
Hematocrit: 36.2 % (ref 34.0–46.6)
Hemoglobin: 12.2 g/dL (ref 11.1–15.9)
Immature Grans (Abs): 0.1 10*3/uL (ref 0.0–0.1)
Immature Granulocytes: 1 %
Lymphocytes Absolute: 3.4 10*3/uL — ABNORMAL HIGH (ref 0.7–3.1)
Lymphs: 35 %
MCH: 31 pg (ref 26.6–33.0)
MCHC: 33.7 g/dL (ref 31.5–35.7)
MCV: 92 fL (ref 79–97)
Monocytes Absolute: 0.6 10*3/uL (ref 0.1–0.9)
Monocytes: 7 %
Neutrophils Absolute: 5.6 10*3/uL (ref 1.4–7.0)
Neutrophils: 55 %
Platelets: 227 10*3/uL (ref 150–450)
RBC: 3.93 x10E6/uL (ref 3.77–5.28)
RDW: 12.4 % (ref 11.7–15.4)
WBC: 9.8 10*3/uL (ref 3.4–10.8)

## 2021-11-29 LAB — LIPID PANEL
Chol/HDL Ratio: 2.9 ratio (ref 0.0–4.4)
Cholesterol, Total: 157 mg/dL (ref 100–199)
HDL: 54 mg/dL (ref >39)
LDL Chol Calc (NIH): 68 mg/dL (ref 0–99)
Triglycerides: 214 mg/dL — ABNORMAL HIGH (ref 0–149)
VLDL Cholesterol Cal: 35 mg/dL (ref 5–40)

## 2021-11-29 LAB — CMP14+EGFR
ALT: 7 IU/L (ref 0–32)
AST: 15 IU/L (ref 0–40)
Albumin/Globulin Ratio: 1.8 (ref 1.2–2.2)
Albumin: 4.8 g/dL — ABNORMAL HIGH (ref 3.7–4.7)
Alkaline Phosphatase: 82 IU/L (ref 44–121)
BUN/Creatinine Ratio: 16 (ref 12–28)
BUN: 18 mg/dL (ref 8–27)
Bilirubin Total: 0.3 mg/dL (ref 0.0–1.2)
CO2: 25 mmol/L (ref 20–29)
Calcium: 10.3 mg/dL (ref 8.7–10.3)
Chloride: 99 mmol/L (ref 96–106)
Creatinine, Ser: 1.15 mg/dL — ABNORMAL HIGH (ref 0.57–1.00)
Globulin, Total: 2.7 g/dL (ref 1.5–4.5)
Glucose: 102 mg/dL — ABNORMAL HIGH (ref 70–99)
Potassium: 4.4 mmol/L (ref 3.5–5.2)
Sodium: 139 mmol/L (ref 134–144)
Total Protein: 7.5 g/dL (ref 6.0–8.5)
eGFR: 50 mL/min/{1.73_m2} — ABNORMAL LOW (ref >59)

## 2021-12-01 NOTE — Telephone Encounter (Signed)
Pt aware - ordering prolia today - will be Buy and bill - will call pt next week, once it is at [facility] to sched her for the inj.

## 2021-12-04 NOTE — Telephone Encounter (Signed)
Scheduled for 400 pm on 6/6 for prolia

## 2021-12-05 ENCOUNTER — Ambulatory Visit (INDEPENDENT_AMBULATORY_CARE_PROVIDER_SITE_OTHER): Payer: PPO | Admitting: *Deleted

## 2021-12-05 DIAGNOSIS — M810 Age-related osteoporosis without current pathological fracture: Secondary | ICD-10-CM | POA: Diagnosis not present

## 2021-12-05 MED ORDER — DENOSUMAB 60 MG/ML ~~LOC~~ SOSY
60.0000 mg | PREFILLED_SYRINGE | Freq: Once | SUBCUTANEOUS | Status: AC
  Administered 2021-12-05: 60 mg via SUBCUTANEOUS

## 2022-02-14 ENCOUNTER — Ambulatory Visit: Payer: PPO

## 2022-02-16 DIAGNOSIS — H2511 Age-related nuclear cataract, right eye: Secondary | ICD-10-CM | POA: Diagnosis not present

## 2022-02-16 DIAGNOSIS — H2512 Age-related nuclear cataract, left eye: Secondary | ICD-10-CM | POA: Diagnosis not present

## 2022-02-27 ENCOUNTER — Ambulatory Visit (INDEPENDENT_AMBULATORY_CARE_PROVIDER_SITE_OTHER): Payer: PPO

## 2022-02-27 DIAGNOSIS — Z0000 Encounter for general adult medical examination without abnormal findings: Secondary | ICD-10-CM

## 2022-02-27 NOTE — Progress Notes (Cosign Needed Addendum)
MEDICARE ANNUAL WELLNESS VISIT  02/27/2022  Telephone Visit Disclaimer This Medicare AWV was conducted by telephone due to national recommendations for restrictions regarding the COVID-19 Pandemic (e.g. social distancing).  I verified, using two identifiers, that I am speaking with Kristen Munoz or their authorized healthcare agent. I discussed the limitations, risks, security, and privacy concerns of performing an evaluation and management service by telephone and the potential availability of an in-person appointment in the future. The patient expressed understanding and agreed to proceed.  Location of Patient: Home   Location of Provider (nurse):  [facility]  Subjective:    Kristen Munoz is a 76 y.o. female patient of [name], FNP who had a Medicare Annual Wellness Visit today via telephone. Kristen Munoz is Retired and lives alone. She has three children, one has passed away, five grandchildren, and two great grandchildren. She reports that she is socially active and does interact with friends/family regularly. She is minimally physically active and enjoys spending time with her family.  Patient Care Team: [name], FNP as PCP - General (Nurse Practitioner) Michaelle Copas, MD as Referring Physician (Optometry)     02/27/2022    3:29 PM 02/13/2021    3:36 PM 01/06/2020   11:03 AM 12/04/2018    3:24 PM 06/27/2017    5:58 PM 10/07/2015    3:51 PM 09/17/2014    4:30 PM  Advanced Directives  Does Patient Have a Medical Advance Directive? No No No No No No No  Does patient want to make changes to medical advance directive?   No - Patient declined      Would patient like information on creating a medical advance directive? No - Patient declined No - Patient declined  No - Patient declined  Yes - Educational materials given No - patient declined information    Hospital Utilization Over the Past 12 Months: # of hospitalizations or ER visits: 0 # of surgeries: 1  Review of  Systems    Patient reports that her overall health is worse compared to last year.  History obtained from chart review and the patient  Patient Reported Readings (BP, Pulse, CBG, Weight, etc) none  Pain Assessment Pain : 0-10 Pain Score: 3  Pain Location: Eye Pain Orientation: Left Pain Descriptors / Indicators: Aching Pain Onset: 1 to 4 weeks ago     Current Medications & Allergies (verified) Allergies as of 02/27/2022       Reactions   Sulfa Antibiotics Nausea Only        Medication List        Accurate as of February 27, 2022  3:40 PM. If you have any questions, ask your nurse or doctor.          aspirin EC 81 MG tablet Take 81 mg by mouth daily.   citalopram 20 MG tablet Commonly known as: CeleXA Take 1 tablet (20 mg total) by mouth daily.   ferrous sulfate 325 (65 FE) MG tablet Take 1 tablet (325 mg total) by mouth daily with breakfast. (NEEDS TO BE SEEN BEFORE NEXT REFILL)   levothyroxine 25 MCG tablet Commonly known as: Euthyrox TAKE 1 TABLET BY MOUTH ONCE DAILY BEFORE BREAKFAST   lisinopril-hydrochlorothiazide 10-12.5 MG tablet Commonly known as: ZESTORETIC TAKE 1 TABLET BY MOUTH ONCE DAILY (NEEDS TO BE SEEN BEFORE NEXT REFILL)   metoprolol tartrate 25 MG tablet Commonly known as: LOPRESSOR Take 1/2 (one-half) tablet by mouth twice daily   pantoprazole 40 MG tablet Commonly  known as: PROTONIX Take 1 tablet (40 mg total) by mouth daily. (NEEDS TO BE SEEN BEFORE NEXT REFILL)   rosuvastatin 10 MG tablet Commonly known as: Crestor Take 1 tablet (10 mg total) by mouth daily.        History (reviewed): Past Medical History:  Diagnosis Date   CAD (coronary artery disease)    GERD (gastroesophageal reflux disease)    Hyperlipidemia    Hypertension    Insomnia    Osteoporosis    Syncopal episodes    Thyroid disease    Urinary bladder incontinence    Past Surgical History:  Procedure Laterality Date   ABDOMINAL HYSTERECTOMY     partial    bladder      baldder tack   THYROID SURGERY     VESICOVAGINAL FISTULA CLOSURE W/ TAH     Family History  Problem Relation Age of Onset   Cancer Mother        colon   Hypertension Mother    Diabetes Mother    Heart disease Mother    Cancer Father        lung   Diabetes Father    Hypertension Father    Heart disease Father    Cancer Sister        bone   Heart failure Sister    Birth defects Sister    Cancer Brother        kidney   Early death Brother        gun shot   Birth defects Brother    Heart failure Brother    Stevens-Johnson syndrome Sister    Anxiety disorder Sister    Depression Sister    Drug abuse Paternal Uncle    Thyroid disease Daughter    Heart attack Daughter    Diabetes Daughter    Social History   Socioeconomic History   Marital status: Widowed    Spouse name: Not on file   Number of children: 2   Years of education: 11th   Highest education level: Not on file  Occupational History   Occupation: RETIRED   Tobacco Use   Smoking status: Every Day    Packs/day: 1.00    Years: 38.00    Total pack years: 38.00    Types: Cigarettes    Start date: 07/02/1974   Smokeless tobacco: Never  Vaping Use   Vaping Use: Never used  Substance and Sexual Activity   Alcohol use: No   Drug use: No   Sexual activity: Not Currently  Other Topics Concern   Not on file  Social History Narrative   Lives alone,   Right-handed.   Caffeine use: 2 cups coffee daily, 2-3 cans Pepsi daily   One daughter passed away, she has 2 daughters that live nearby   Social Determinants of Health   Financial Resource Strain: Low Risk  (02/13/2021)   Overall Financial Resource Strain (CARDIA)    Difficulty of Paying Living Expenses: Not hard at all  Food Insecurity: No Food Insecurity (02/13/2021)   Hunger Vital Sign    Worried About Running Out of Food in the Last Year: Never true    Ran Out of Food in the Last Year: Never true  Transportation Needs: No Transportation  Needs (02/13/2021)   PRAPARE - Administrator, Civil ServiceTransportation    Lack of Transportation (Medical): No    Lack of Transportation (Non-Medical): No  Physical Activity: Inactive (02/13/2021)   Exercise Vital Sign    Days of Exercise per Week: 0  days    Minutes of Exercise per Session: 0 min  Stress: Stress Concern Present (02/13/2021)   [employer] of Occupational Health - Occupational Stress Questionnaire    Feeling of Stress : Rather much  Social Connections: Socially Isolated (02/13/2021)   Social Connection and Isolation Panel [NHANES]    Frequency of Communication with Friends and Family: More than three times a week    Frequency of Social Gatherings with Friends and Family: Once a week    Attends Religious Services: Never    Database administrator or Organizations: No    Attends Banker Meetings: Never    Marital Status: Widowed    Activities of Daily Living    02/27/2022    3:35 PM  In your present state of health, do you have any difficulty performing the following activities:  Hearing? 0  Vision? 1  Difficulty concentrating or making decisions? 0  Walking or climbing stairs? 0  Dressing or bathing? 0  Doing errands, shopping? 0  Preparing Food and eating ? N  Using the Toilet? N  In the past six months, have you accidently leaked urine? N  Do you have problems with loss of bowel control? N  Managing your Medications? N  Managing your Finances? N  Housekeeping or managing your Housekeeping? N    Patient Education/ Literacy How often do you need to have someone help you when you read instructions, pamphlets, or other written materials from your doctor or pharmacy?: 1 - Never  Exercise Current Exercise Habits: The patient does not participate in regular exercise at present, Exercise limited by: None identified  Diet Patient reports consuming 2 meals a day and 2 snack(s) a day Patient reports that her primary diet is: Regular Patient reports that she does have  regular access to food.   Depression Screen    02/27/2022    3:40 PM 11/28/2021    1:54 PM 05/29/2021   11:43 AM 02/13/2021    3:24 PM 12/01/2020    4:15 PM 06/29/2020    3:12 PM 01/19/2020    2:45 PM  PHQ 2/9 Scores  PHQ - 2 Score 0 1 0 2 0 3 0  PHQ- 9 Score  7 0 9  9      Fall Risk    02/27/2022    3:40 PM 11/28/2021    1:54 PM 05/29/2021   11:43 AM 02/13/2021    3:36 PM 12/01/2020    4:15 PM  Fall Risk   Falls in the past year? 0 0 0 0 0  Number falls in past yr:    0   Injury with Fall?    0   Risk for fall due to :    Medication side effect   Follow up Falls evaluation completed   Falls prevention discussed      Objective:  Kristen Munoz seemed alert and oriented and she participated appropriately during our telephone visit.  Blood Pressure Weight BMI  BP Readings from Last 3 Encounters:  11/28/21 132/78  05/29/21 129/72  12/01/20 135/68   Wt Readings from Last 3 Encounters:  11/28/21 143 lb (64.9 kg)  05/29/21 144 lb (65.3 kg)  02/13/21 144 lb (65.3 kg)   BMI Readings from Last 1 Encounters:  11/28/21 25.33 kg/m    *Unable to obtain current vital signs, weight, and BMI due to telephone visit type  Hearing/Vision  Reya did not seem to have difficulty with hearing/understanding during the telephone conversation Reports  that she has had a formal eye exam by an eye care professional within the past year Reports that she has not had a formal hearing evaluation within the past year *Unable to fully assess hearing and vision during telephone visit type  Cognitive Function:    02/27/2022    3:39 PM 02/13/2021    3:39 PM 01/06/2020   11:04 AM 12/04/2018    3:28 PM  6CIT Screen  What Year? 0 points 0 points 0 points 0 points  What month? 0 points 0 points 0 points 0 points  What time? 0 points 0 points 0 points 0 points  Count back from 20 0 points 0 points 0 points 0 points  Months in reverse 0 points 0 points 0 points 0 points  Repeat phrase 0 points 2 points 0  points 0 points  Total Score 0 points 2 points 0 points 0 points   (Normal:0-7, Significant for Dysfunction: >8)  Normal Cognitive Function Screening: Yes   Immunization & Health Maintenance Record Immunization History  Administered Date(s) Administered   Fluad Quad(high Dose 65+) 06/02/2019, 05/29/2021   Influenza, High Dose Seasonal PF 04/18/2016, 05/20/2017, 05/09/2018   Influenza,inj,Quad PF,6+ Mos 04/22/2013, 06/05/2014, 03/31/2015   Moderna Sars-Covid-2 Vaccination 11/04/2019, 12/04/2019   Pneumococcal Conjugate-13 10/11/2014   Pneumococcal Polysaccharide-23 05/03/2011   Zoster, Live 09/17/2014    Health Maintenance  Topic Date Due   COVID-19 Vaccine (3 - Moderna series) 01/29/2020   Zoster Vaccines- Shingrix (1 of 2) 02/28/2022 (Originally 12/29/1995)   MAMMOGRAM  05/29/2022 (Originally 05/07/2017)   TETANUS/TDAP  05/29/2022 (Originally 04/11/2019)   INFLUENZA VACCINE  09/30/2022 (Originally 01/30/2022)   DEXA SCAN  06/30/2022   Pneumonia Vaccine 66+ Years old  Completed   Hepatitis C Screening  Completed   HPV VACCINES  Aged Out   COLONOSCOPY (Pts 45-63yrs Insurance coverage will need to be confirmed)  Discontinued       Assessment  This is a routine wellness examination for Kristen Munoz.  Health Maintenance: Due or Overdue Health Maintenance Due  Topic Date Due   COVID-19 Vaccine (3 - Moderna series) 01/29/2020    Kristen Munoz does not need a referral for Community Assistance: Care Management:   no Social Work:    no Prescription Assistance:  no Nutrition/Diabetes Education:  no   Plan:  Personalized Goals  Goals Addressed             This Visit's Progress    Patient Stated       02/27/2022 AWV Goal: Exercise for General Health  Patient will verbalize understanding of the benefits of increased physical activity: Exercising regularly is important. It will improve your overall fitness, flexibility, and endurance. Regular exercise also will improve  your overall health. It can help you control your weight, reduce stress, and improve your bone density. Over the next year, patient will increase physical activity as tolerated with a goal of at least 150 minutes of moderate physical activity per week.  You can tell that you are exercising at a moderate intensity if your heart starts beating faster and you start breathing faster but can still hold a conversation. Moderate-intensity exercise ideas include: Walking 1 mile (1.6 km) in about 15 minutes Biking Hiking Golfing Dancing Water aerobics Patient will verbalize understanding of everyday activities that increase physical activity by providing examples like the following: Yard work, such as: Production designer, theatre/television/film windows or  floors Patient will be able to explain general safety guidelines for exercising:  Before you start a new exercise program, talk with your health care provider. Do not exercise so much that you hurt yourself, feel dizzy, or get very short of breath. Wear comfortable clothes and wear shoes with good support. Drink plenty of water while you exercise to prevent dehydration or heat stroke. Work out until your breathing and your heartbeat get faster.        Personalized Health Maintenance & Screening Recommendations  Td vaccine Screening mammography Shingrix vaccine  Lung Cancer Screening Recommended: yes (Low Dose CT Chest recommended if Age 11-80 years, 30 pack-year currently smoking OR have quit w/in past 15 years) Hepatitis C Screening recommended: no HIV Screening recommended: no  Advanced Directives: Written information was not prepared per patient's request.  Referrals & Orders No orders of the defined types were placed in this encounter.   Follow-up Plan Follow-up with [name], FNP as planned     I have personally reviewed and noted the  following in the patient's chart:   Medical and social history Use of alcohol, tobacco or illicit drugs  Current medications and supplements Functional ability and status Nutritional status Physical activity Advanced directives List of other physicians Hospitalizations, surgeries, and ER visits in previous 12 months Vitals Screenings to include cognitive, depression, and falls Referrals and appointments  In addition, I have reviewed and discussed with Kristen Munoz certain preventive protocols, quality metrics, and best practice recommendations. A written personalized care plan for preventive services as well as general preventive health recommendations is available and can be mailed to the patient at her request.      Ulice Brilliant  02/27/2022  Patient declined after visit summary  I have reviewed and agree with the above AWV documentation.   Mary-Margaret Daphine Deutscher, FNP

## 2022-03-12 ENCOUNTER — Ambulatory Visit: Payer: PPO | Admitting: Nurse Practitioner

## 2022-03-12 ENCOUNTER — Encounter: Payer: Self-pay | Admitting: Nurse Practitioner

## 2022-03-30 DIAGNOSIS — H2511 Age-related nuclear cataract, right eye: Secondary | ICD-10-CM | POA: Diagnosis not present

## 2022-05-31 ENCOUNTER — Ambulatory Visit (INDEPENDENT_AMBULATORY_CARE_PROVIDER_SITE_OTHER): Payer: PPO | Admitting: Nurse Practitioner

## 2022-05-31 ENCOUNTER — Encounter: Payer: Self-pay | Admitting: Nurse Practitioner

## 2022-05-31 VITALS — BP 107/63 | HR 53 | Temp 97.0°F | Resp 20 | Ht 63.0 in | Wt 142.0 lb

## 2022-05-31 DIAGNOSIS — I10 Essential (primary) hypertension: Secondary | ICD-10-CM

## 2022-05-31 DIAGNOSIS — E782 Mixed hyperlipidemia: Secondary | ICD-10-CM | POA: Diagnosis not present

## 2022-05-31 DIAGNOSIS — D509 Iron deficiency anemia, unspecified: Secondary | ICD-10-CM

## 2022-05-31 DIAGNOSIS — Z23 Encounter for immunization: Secondary | ICD-10-CM

## 2022-05-31 DIAGNOSIS — E039 Hypothyroidism, unspecified: Secondary | ICD-10-CM | POA: Diagnosis not present

## 2022-05-31 DIAGNOSIS — M810 Age-related osteoporosis without current pathological fracture: Secondary | ICD-10-CM

## 2022-05-31 DIAGNOSIS — I2510 Atherosclerotic heart disease of native coronary artery without angina pectoris: Secondary | ICD-10-CM | POA: Diagnosis not present

## 2022-05-31 DIAGNOSIS — F3342 Major depressive disorder, recurrent, in full remission: Secondary | ICD-10-CM | POA: Diagnosis not present

## 2022-05-31 DIAGNOSIS — F411 Generalized anxiety disorder: Secondary | ICD-10-CM

## 2022-05-31 DIAGNOSIS — K219 Gastro-esophageal reflux disease without esophagitis: Secondary | ICD-10-CM | POA: Diagnosis not present

## 2022-05-31 DIAGNOSIS — F5101 Primary insomnia: Secondary | ICD-10-CM

## 2022-05-31 MED ORDER — CITALOPRAM HYDROBROMIDE 20 MG PO TABS: 20.0000 mg | ORAL_TABLET | Freq: Every day | ORAL | 1 refills | Status: DC

## 2022-05-31 MED ORDER — METOPROLOL TARTRATE 25 MG PO TABS: ORAL_TABLET | ORAL | 1 refills | Status: DC

## 2022-05-31 MED ORDER — LISINOPRIL-HYDROCHLOROTHIAZIDE 10-12.5 MG PO TABS: ORAL_TABLET | ORAL | 1 refills | Status: DC

## 2022-05-31 MED ORDER — FERROUS SULFATE 325 (65 FE) MG PO TABS
325.0000 mg | ORAL_TABLET | Freq: Every day | ORAL | 1 refills | Status: DC
Start: 2022-05-31 — End: 2022-12-21

## 2022-05-31 MED ORDER — PANTOPRAZOLE SODIUM 40 MG PO TBEC
40.0000 mg | DELAYED_RELEASE_TABLET | Freq: Every day | ORAL | 1 refills | Status: DC
Start: 2022-05-31 — End: 2022-12-21

## 2022-05-31 MED ORDER — ROSUVASTATIN CALCIUM 10 MG PO TABS: 10.0000 mg | ORAL_TABLET | Freq: Every day | ORAL | 1 refills | Status: DC

## 2022-05-31 MED ORDER — LEVOTHYROXINE SODIUM 25 MCG PO TABS: ORAL_TABLET | ORAL | 1 refills | Status: DC

## 2022-05-31 NOTE — Addendum Note (Signed)
Addended by: [name] on: 05/31/2022 02:35 PM   Modules accepted: Orders, Level of Service

## 2022-05-31 NOTE — Progress Notes (Signed)
Subjective:    Patient ID: Kristen Munoz, female    DOB: 07/10/1945, 76 y.o.   MRN: [id number]   Chief Complaint: medical management of chronic issues     HPI:  Kristen Munoz is a 76 y.o. who identifies as a female who was assigned female at birth.   Social history: Lives with: by herself since her husband died Work history: retired   Water engineer in today for follow up of the following chronic medical issues:  1. Essential hypertension, benign No c/o chest pain, sob or headache. Doe snot check blood pressure at home. BP Readings from Last 3 Encounters:  11/28/21 132/78  05/29/21 129/72  12/01/20 135/68     2. Coronary artery disease involving native coronary artery of native heart without angina pectoris Has not seen cardiology in several years  3. Acquired hypothyroidism No recent changes Lab Results  Component Value Date   TSH 0.861 11/28/2021     4. Gastroesophageal reflux disease, unspecified whether esophagitis present Is on protonix daily , which works well to keep symptoms under control.  5. Mixed hyperlipidemia Does try to watch diet but does not do much exercise. She says she does try to stay active. Lab Results  Component Value Date   CHOL 157 11/28/2021   HDL 54 11/28/2021   LDLCALC 68 11/28/2021   TRIG 214 (H) 11/28/2021   CHOLHDL 2.9 11/28/2021    6. GAD (generalized anxiety disorder) 7. Recurrent major depressive disorder, in full remission (HCC) Is on celexa daily and is doing well.    05/31/2022    2:11 PM 11/28/2021    1:54 PM 05/29/2021   11:43 AM 06/29/2020    3:30 PM  GAD 7 : Generalized Anxiety Score  Nervous, Anxious, on Edge 2 2 0 1  Control/stop worrying 2 2 0 3  Worry too much - different things 2 2 0   Trouble relaxing 2 2 0 1  Restless 2 2 0 1  Easily annoyed or irritable 2 2 0 1  Afraid - awful might happen 2 2 0 0  Total GAD 7 Score 14 14 0   Anxiety Difficulty Somewhat difficult Somewhat difficult Not difficult at all  Somewhat difficult       05/31/2022    2:11 PM 02/27/2022    3:40 PM 11/28/2021    1:54 PM  Depression screen PHQ 2/9  Decreased Interest 0 0 1  Down, Depressed, Hopeless 1 0 0  PHQ - 2 Score 1 0 1  Altered sleeping 1  1  Tired, decreased energy 1  1  Change in appetite 0  1  Feeling bad or failure about yourself  0  1  Trouble concentrating 0  0  Moving slowly or fidgety/restless 0  1  Suicidal thoughts 0  1  PHQ-9 Score 3  7  Difficult doing work/chores Not difficult at all  Somewhat difficult     8. Primary insomnia Is on nothing for sleep. Sleeps about 6-7 hours a night  9. Age-related osteoporosis without current pathological fracture Last dexascan was done on 07/01/20. Her t score was -1.9. Needs to repeat scan.   New complaints: None today  Allergies  Allergen Reactions   Sulfa Antibiotics Nausea Only   Outpatient Encounter Medications as of 05/31/2022  Medication Sig   aspirin EC 81 MG tablet Take 81 mg by mouth daily.   citalopram (CELEXA) 20 MG tablet Take 1 tablet (20 mg total) by mouth daily.   ferrous  sulfate 325 (65 FE) MG tablet Take 1 tablet (325 mg total) by mouth daily with breakfast. (NEEDS TO BE SEEN BEFORE NEXT REFILL)   levothyroxine (EUTHYROX) 25 MCG tablet TAKE 1 TABLET BY MOUTH ONCE DAILY BEFORE BREAKFAST   lisinopril-hydrochlorothiazide (ZESTORETIC) 10-12.5 MG tablet TAKE 1 TABLET BY MOUTH ONCE DAILY (NEEDS TO BE SEEN BEFORE NEXT REFILL)   metoprolol tartrate (LOPRESSOR) 25 MG tablet Take 1/2 (one-half) tablet by mouth twice daily   pantoprazole (PROTONIX) 40 MG tablet Take 1 tablet (40 mg total) by mouth daily. (NEEDS TO BE SEEN BEFORE NEXT REFILL)   rosuvastatin (CRESTOR) 10 MG tablet Take 1 tablet (10 mg total) by mouth daily.   No facility-administered encounter medications on file as of 05/31/2022.    Past Surgical History:  Procedure Laterality Date   ABDOMINAL HYSTERECTOMY     partial   bladder      baldder tack   THYROID  SURGERY     VESICOVAGINAL FISTULA CLOSURE W/ TAH      Family History  Problem Relation Age of Onset   Cancer Mother        colon   Hypertension Mother    Diabetes Mother    Heart disease Mother    Cancer Father        lung   Diabetes Father    Hypertension Father    Heart disease Father    Cancer Sister        bone   Heart failure Sister    Birth defects Sister    Cancer Brother        kidney   Early death Brother        gun shot   Birth defects Brother    Heart failure Brother    Stevens-Johnson syndrome Sister    Anxiety disorder Sister    Depression Sister    Drug abuse Paternal Uncle    Thyroid disease Daughter    Heart attack Daughter    Diabetes Daughter       Controlled substance contract: n/a     Review of Systems  Constitutional:  Negative for diaphoresis.  Eyes:  Negative for pain.  Respiratory:  Negative for shortness of breath.   Cardiovascular:  Negative for chest pain, palpitations and leg swelling.  Gastrointestinal:  Negative for abdominal pain.  Endocrine: Negative for polydipsia.  Skin:  Negative for rash.  Neurological:  Negative for dizziness, weakness and headaches.  Hematological:  Does not bruise/bleed easily.  All other systems reviewed and are negative.      Objective:   Physical Exam Vitals and nursing note reviewed.  Constitutional:      General: She is not in acute distress.    Appearance: Normal appearance. She is well-developed.  HENT:     Head: Normocephalic.     Right Ear: Tympanic membrane normal.     Left Ear: Tympanic membrane normal.     Nose: Nose normal.     Mouth/Throat:     Mouth: Mucous membranes are moist.  Eyes:     Pupils: Pupils are equal, round, and reactive to light.  Neck:     Vascular: No carotid bruit or JVD.  Cardiovascular:     Rate and Rhythm: Normal rate and regular rhythm.     Heart sounds: Normal heart sounds.  Pulmonary:     Effort: Pulmonary effort is normal. No respiratory distress.      Breath sounds: Normal breath sounds. No wheezing or rales.  Chest:  Chest wall: No tenderness.  Abdominal:     General: Bowel sounds are normal. There is no distension or abdominal bruit.     Palpations: Abdomen is soft. There is no hepatomegaly, splenomegaly, mass or pulsatile mass.     Tenderness: There is no abdominal tenderness.  Musculoskeletal:        General: Normal range of motion.     Cervical back: Normal range of motion and neck supple.  Lymphadenopathy:     Cervical: No cervical adenopathy.  Skin:    General: Skin is warm and dry.  Neurological:     Mental Status: She is alert and oriented to person, place, and time.     Deep Tendon Reflexes: Reflexes are normal and symmetric.  Psychiatric:        Behavior: Behavior normal.        Thought Content: Thought content normal.        Judgment: Judgment normal.   BP 107/63   Pulse (!) 53   Temp (!) 97 F (36.1 C) (Temporal)   Resp 20   Ht 5\' 3"  (1.6 m)   Wt 142 lb (64.4 kg)   SpO2 95%   BMI 25.15 kg/m          Assessment & Plan:   DAYANA DALPORTO comes in today with chief complaint of Medical Management of Chronic Issues   Diagnosis and orders addressed:  1. Essential hypertension, benign Low sodium diet - lisinopril-hydrochlorothiazide (ZESTORETIC) 10-12.5 MG tablet; TAKE 1 TABLET BY MOUTH ONCE DAILY (NEEDS TO BE SEEN BEFORE NEXT REFILL)  Dispense: 90 tablet; Refill: 1 - metoprolol tartrate (LOPRESSOR) 25 MG tablet; Take 1/2 (one-half) tablet by mouth twice daily  Dispense: 90 tablet; Refill: 1  2. Coronary artery disease involving native coronary artery of native heart without angina pectoris  3. Acquired hypothyroidism Labs pending - levothyroxine (EUTHYROX) 25 MCG tablet; TAKE 1 TABLET BY MOUTH ONCE DAILY BEFORE BREAKFAST  Dispense: 90 tablet; Refill: 1  4. Gastroesophageal reflux disease, unspecified whether esophagitis present Avoid spicy foods Do not eat 2 hours prior to bedtime -  pantoprazole (PROTONIX) 40 MG tablet; Take 1 tablet (40 mg total) by mouth daily. (NEEDS TO BE SEEN BEFORE NEXT REFILL)  Dispense: 90 tablet; Refill: 1  5. Mixed hyperlipidemia Low fat diet - rosuvastatin (CRESTOR) 10 MG tablet; Take 1 tablet (10 mg total) by mouth daily.  Dispense: 90 tablet; Refill: 1  6. GAD (generalized anxiety disorder) Stress management - citalopram (CELEXA) 20 MG tablet; Take 1 tablet (20 mg total) by mouth daily.  Dispense: 90 tablet; Refill: 1  7. Recurrent major depressive disorder, in full remission (HCC)  8. Primary insomnia Bedtime routine  9. Age-related osteoporosis without current pathological fracture Weight bearing exercise  10. Iron deficiency anemia, unspecified iron deficiency anemia type Labs pending - ferrous sulfate 325 (65 FE) MG tablet; Take 1 tablet (325 mg total) by mouth daily with breakfast. (NEEDS TO BE SEEN BEFORE NEXT REFILL)  Dispense: 90 tablet; Refill: 1   Labs pending Health Maintenance reviewed Diet and exercise encouraged  Follow up plan: 6 months   Mary-Margaret [name], FNP

## 2022-05-31 NOTE — Patient Instructions (Signed)
Insomnia Insomnia is a sleep disorder that makes it difficult to fall asleep or stay asleep. Insomnia can cause fatigue, low energy, difficulty concentrating, mood swings, and poor performance at work or school. There are three different ways to classify insomnia: Difficulty falling asleep. Difficulty staying asleep. Waking up too early in the morning. Any type of insomnia can be long-term (chronic) or short-term (acute). Both are common. Short-term insomnia usually lasts for 3 months or less. Chronic insomnia occurs at least three times a week for longer than 3 months. What are the causes? Insomnia may be caused by another condition, situation, or substance, such as: Having certain mental health conditions, such as anxiety and depression. Using caffeine, alcohol, tobacco, or drugs. Having gastrointestinal conditions, such as gastroesophageal reflux disease (GERD). Having certain medical conditions. These include: Asthma. Alzheimer's disease. Stroke. Chronic pain. An overactive thyroid gland (hyperthyroidism). Other sleep disorders, such as restless legs syndrome and sleep apnea. Menopause. Sometimes, the cause of insomnia may not be known. What increases the risk? Risk factors for insomnia include: Gender. Females are affected more often than males. Age. Insomnia is more common as people get older. Stress and certain medical and mental health conditions. Lack of exercise. Having an irregular work schedule. This may include working night shifts and traveling between different time zones. What are the signs or symptoms? If you have insomnia, the main symptom is having trouble falling asleep or having trouble staying asleep. This may lead to other symptoms, such as: Feeling tired or having low energy. Feeling nervous about going to sleep. Not feeling rested in the morning. Having trouble concentrating. Feeling irritable, anxious, or depressed. How is this diagnosed? This condition  may be diagnosed based on: Your symptoms and medical history. Your health care provider may ask about: Your sleep habits. Any medical conditions you have. Your mental health. A physical exam. How is this treated? Treatment for insomnia depends on the cause. Treatment may focus on treating an underlying condition that is causing the insomnia. Treatment may also include: Medicines to help you sleep. Counseling or therapy. Lifestyle adjustments to help you sleep better. Follow these instructions at home: Eating and drinking  Limit or avoid alcohol, caffeinated beverages, and products that contain nicotine and tobacco, especially close to bedtime. These can disrupt your sleep. Do not eat a large meal or eat spicy foods right before bedtime. This can lead to digestive discomfort that can make it hard for you to sleep. Sleep habits  Keep a sleep diary to help you and your health care provider figure out what could be causing your insomnia. Write down: When you sleep. When you wake up during the night. How well you sleep and how rested you feel the next day. Any side effects of medicines you are taking. What you eat and drink. Make your bedroom a dark, comfortable place where it is easy to fall asleep. Put up shades or blackout curtains to block light from outside. Use a white noise machine to block noise. Keep the temperature cool. Limit screen use before bedtime. This includes: Not watching TV. Not using your smartphone, tablet, or computer. Stick to a routine that includes going to bed and waking up at the same times every day and night. This can help you fall asleep faster. Consider making a quiet activity, such as reading, part of your nighttime routine. Try to avoid taking naps during the day so that you sleep better at night. Get out of bed if you are still awake after   15 minutes of trying to sleep. Keep the lights down, but try reading or doing a quiet activity. When you feel  sleepy, go back to bed. General instructions Take over-the-counter and prescription medicines only as told by your health care provider. Exercise regularly as told by your health care provider. However, avoid exercising in the hours right before bedtime. Use relaxation techniques to manage stress. Ask your health care provider to suggest some techniques that may work well for you. These may include: Breathing exercises. Routines to release muscle tension. Visualizing peaceful scenes. Make sure that you drive carefully. Do not drive if you feel very sleepy. Keep all follow-up visits. This is important. Contact a health care provider if: You are tired throughout the day. You have trouble in your daily routine due to sleepiness. You continue to have sleep problems, or your sleep problems get worse. Get help right away if: You have thoughts about hurting yourself or someone else. Get help right away if you feel like you may hurt yourself or others, or have thoughts about taking your own life. Go to your nearest emergency room or: Call 911. Call the National Suicide Prevention Lifeline at 1-800-273-8255 or 988. This is open 24 hours a day. Text the Crisis Text Line at 741741. Summary Insomnia is a sleep disorder that makes it difficult to fall asleep or stay asleep. Insomnia can be long-term (chronic) or short-term (acute). Treatment for insomnia depends on the cause. Treatment may focus on treating an underlying condition that is causing the insomnia. Keep a sleep diary to help you and your health care provider figure out what could be causing your insomnia. This information is not intended to replace advice given to you by your health care provider. Make sure you discuss any questions you have with your health care provider. Document Revised: 05/29/2021 Document Reviewed: 05/29/2021 Elsevier Patient Education  2023 Elsevier Inc.  

## 2022-06-01 NOTE — Addendum Note (Signed)
Addended by: Cleda Daub on: 06/01/2022 04:01 PM   Modules accepted: Orders

## 2022-06-07 ENCOUNTER — Telehealth: Payer: Self-pay | Admitting: Nurse Practitioner

## 2022-06-07 NOTE — Telephone Encounter (Signed)
Detailed message left for patient to call and schedule prolia. She may receive any day after 12/7

## 2022-06-19 ENCOUNTER — Ambulatory Visit (INDEPENDENT_AMBULATORY_CARE_PROVIDER_SITE_OTHER): Payer: PPO | Admitting: *Deleted

## 2022-06-19 DIAGNOSIS — M810 Age-related osteoporosis without current pathological fracture: Secondary | ICD-10-CM

## 2022-06-19 MED ORDER — DENOSUMAB 60 MG/ML ~~LOC~~ SOSY
60.0000 mg | PREFILLED_SYRINGE | Freq: Once | SUBCUTANEOUS | Status: AC
  Administered 2022-06-19: 60 mg via SUBCUTANEOUS

## 2022-06-28 NOTE — Telephone Encounter (Signed)
Injection given 06/19/22

## 2022-10-16 ENCOUNTER — Telehealth: Payer: Self-pay | Admitting: Nurse Practitioner

## 2022-10-16 NOTE — Telephone Encounter (Signed)
Called patient to schedule Medicare Annual Wellness Visit (AWV). Left message for patient to call back and schedule Medicare Annual Wellness Visit (AWV).  Patient has [employer] ADVANTAGE (Calendar) can be scheduled sooner than  03/01/2023   Last date of AWV: 02/27/2022   Please schedule an appointment at any time with either Laura or Courtney, NHA's. .  If any questions, please contact me at [phone].  Thank you,  Stephanie,  AMB Clinical Support [facility] AWV Program Direct Dial ??[phone]    

## 2022-11-05 ENCOUNTER — Telehealth: Payer: Self-pay | Admitting: Nurse Practitioner

## 2022-11-05 NOTE — Telephone Encounter (Signed)
Contacted Kristen Munoz to schedule their annual wellness visit. Appointment made for 11/15/2022.  Thank you,  Judeth Cornfield,  AMB Clinical Support [facility] AWV Program Direct Dial ??[phone]

## 2022-11-09 ENCOUNTER — Telehealth: Payer: Self-pay | Admitting: Nurse Practitioner

## 2022-11-09 NOTE — Telephone Encounter (Signed)
Can we please check benefits for Prolia.  Injection Due Date:  12/20/2022

## 2022-11-12 ENCOUNTER — Telehealth: Payer: Self-pay

## 2022-11-12 ENCOUNTER — Other Ambulatory Visit (HOSPITAL_COMMUNITY): Payer: Self-pay

## 2022-11-12 NOTE — Telephone Encounter (Signed)
   20% coinsurance, no deductible, no prior auth. needed 

## 2022-11-12 NOTE — Telephone Encounter (Signed)
Pt ready for scheduling for PROLIA on or after : 12/20/22  Out-of-pocket cost due at time of visit: $302  Primary: [employer] ADVANTAGE Prolia co-insurance: 20% Admin fee co-insurance: 0%  Secondary: --- Prolia co-insurance:  Admin fee co-insurance:   Medical Benefit Details: Date Benefits were checked: 11/09/22 Deductible: NO/ Coinsurance: 20%/ Admin Fee: 0%  Prior Auth: N/A PA# Expiration Date:    Pharmacy benefit: Copay $200 If patient wants fill through the pharmacy benefit please send prescription to:  [facility] , and include estimated need by date in rx notes. Pharmacy will ship medication directly to the office.  Patient NOT eligible for Prolia Copay Card. Copay Card can make patient's cost as little as $25. Link to apply: [email or web address]://[email or web address]  ** This summary of benefits is an estimation of the patient's out-of-pocket cost. Exact cost may very based on individual plan coverage.

## 2022-11-12 NOTE — Telephone Encounter (Signed)
Created new encounter for Prolia BIV. Routed to pool.

## 2022-11-15 ENCOUNTER — Ambulatory Visit (INDEPENDENT_AMBULATORY_CARE_PROVIDER_SITE_OTHER): Payer: PPO

## 2022-11-15 VITALS — Ht 63.0 in | Wt 145.0 lb

## 2022-11-15 DIAGNOSIS — R55 Syncope and collapse: Secondary | ICD-10-CM | POA: Diagnosis not present

## 2022-11-15 DIAGNOSIS — S0285XA Fracture of orbit, unspecified, initial encounter for closed fracture: Secondary | ICD-10-CM | POA: Diagnosis not present

## 2022-11-15 DIAGNOSIS — Z8249 Family history of ischemic heart disease and other diseases of the circulatory system: Secondary | ICD-10-CM | POA: Diagnosis not present

## 2022-11-15 DIAGNOSIS — Z0000 Encounter for general adult medical examination without abnormal findings: Secondary | ICD-10-CM | POA: Diagnosis not present

## 2022-11-15 DIAGNOSIS — S299XXA Unspecified injury of thorax, initial encounter: Secondary | ICD-10-CM | POA: Diagnosis not present

## 2022-11-15 DIAGNOSIS — Z79899 Other long term (current) drug therapy: Secondary | ICD-10-CM | POA: Diagnosis not present

## 2022-11-15 DIAGNOSIS — S0232XA Fracture of orbital floor, left side, initial encounter for closed fracture: Secondary | ICD-10-CM | POA: Diagnosis not present

## 2022-11-15 DIAGNOSIS — J439 Emphysema, unspecified: Secondary | ICD-10-CM | POA: Diagnosis not present

## 2022-11-15 DIAGNOSIS — E860 Dehydration: Secondary | ICD-10-CM | POA: Diagnosis not present

## 2022-11-15 DIAGNOSIS — Z823 Family history of stroke: Secondary | ICD-10-CM | POA: Diagnosis not present

## 2022-11-15 DIAGNOSIS — S20212A Contusion of left front wall of thorax, initial encounter: Secondary | ICD-10-CM | POA: Diagnosis not present

## 2022-11-15 DIAGNOSIS — Z780 Asymptomatic menopausal state: Secondary | ICD-10-CM

## 2022-11-15 DIAGNOSIS — R001 Bradycardia, unspecified: Secondary | ICD-10-CM | POA: Diagnosis not present

## 2022-11-15 DIAGNOSIS — I272 Pulmonary hypertension, unspecified: Secondary | ICD-10-CM | POA: Diagnosis not present

## 2022-11-15 DIAGNOSIS — Z800 Family history of malignant neoplasm of digestive organs: Secondary | ICD-10-CM | POA: Diagnosis not present

## 2022-11-15 DIAGNOSIS — F17210 Nicotine dependence, cigarettes, uncomplicated: Secondary | ICD-10-CM | POA: Diagnosis not present

## 2022-11-15 DIAGNOSIS — N179 Acute kidney failure, unspecified: Secondary | ICD-10-CM | POA: Diagnosis not present

## 2022-11-15 DIAGNOSIS — E079 Disorder of thyroid, unspecified: Secondary | ICD-10-CM | POA: Diagnosis not present

## 2022-11-15 DIAGNOSIS — I10 Essential (primary) hypertension: Secondary | ICD-10-CM | POA: Diagnosis not present

## 2022-11-15 DIAGNOSIS — Z122 Encounter for screening for malignant neoplasm of respiratory organs: Secondary | ICD-10-CM

## 2022-11-15 DIAGNOSIS — I2510 Atherosclerotic heart disease of native coronary artery without angina pectoris: Secondary | ICD-10-CM | POA: Diagnosis not present

## 2022-11-15 DIAGNOSIS — K219 Gastro-esophageal reflux disease without esophagitis: Secondary | ICD-10-CM | POA: Diagnosis not present

## 2022-11-15 DIAGNOSIS — I081 Rheumatic disorders of both mitral and tricuspid valves: Secondary | ICD-10-CM | POA: Diagnosis not present

## 2022-11-15 DIAGNOSIS — Z7982 Long term (current) use of aspirin: Secondary | ICD-10-CM | POA: Diagnosis not present

## 2022-11-15 DIAGNOSIS — J029 Acute pharyngitis, unspecified: Secondary | ICD-10-CM | POA: Diagnosis not present

## 2022-11-15 DIAGNOSIS — W19XXXA Unspecified fall, initial encounter: Secondary | ICD-10-CM | POA: Diagnosis not present

## 2022-11-15 DIAGNOSIS — T675XXA Heat exhaustion, unspecified, initial encounter: Secondary | ICD-10-CM | POA: Diagnosis not present

## 2022-11-15 DIAGNOSIS — Z882 Allergy status to sulfonamides status: Secondary | ICD-10-CM | POA: Diagnosis not present

## 2022-11-15 DIAGNOSIS — Z801 Family history of malignant neoplasm of trachea, bronchus and lung: Secondary | ICD-10-CM | POA: Diagnosis not present

## 2022-11-15 NOTE — Patient Instructions (Signed)
Kristen Munoz , Thank you for taking time to come for your Medicare Wellness Visit. I appreciate your ongoing commitment to your health goals. Please review the following plan we discussed and let me know if I can assist you in the future.   These are the goals we discussed:  Goals      DIET - INCREASE WATER INTAKE     Pt is now drinking water, until last winter she would not drink water.     Have 3 meals a day     Pt had previously lost weight and is now trying to gain some back and stay at a healthly weight     Patient Stated     02/27/2022 AWV Goal: Exercise for General Health  Patient will verbalize understanding of the benefits of increased physical activity: Exercising regularly is important. It will improve your overall fitness, flexibility, and endurance. Regular exercise also will improve your overall health. It can help you control your weight, reduce stress, and improve your bone density. Over the next year, patient will increase physical activity as tolerated with a goal of at least 150 minutes of moderate physical activity per week.  You can tell that you are exercising at a moderate intensity if your heart starts beating faster and you start breathing faster but can still hold a conversation. Moderate-intensity exercise ideas include: Walking 1 mile (1.6 km) in about 15 minutes Biking Hiking Golfing Dancing Water aerobics Patient will verbalize understanding of everyday activities that increase physical activity by providing examples like the following: Yard work, such as: Insurance underwriter Gardening Washing windows or floors Patient will be able to explain general safety guidelines for exercising:  Before you start a new exercise program, talk with your health care provider. Do not exercise so much that you hurt yourself, feel dizzy, or get very short of breath. Wear comfortable clothes  and wear shoes with good support. Drink plenty of water while you exercise to prevent dehydration or heat stroke. Work out until your breathing and your heartbeat get faster.      Prevent falls     Stay active Travel and visit family         This is a list of the screening recommended for you and due dates:  Health Maintenance  Topic Date Due   Zoster (Shingles) Vaccine (1 of 2) Never done   Screening for Lung Cancer  11/14/2017   COVID-19 Vaccine (3 - 2023-24 season) 03/02/2022   DEXA scan (bone density measurement)  06/30/2022   Mammogram  06/01/2023*   Flu Shot  01/31/2023   Medicare Annual Wellness Visit  11/15/2023   DTaP/Tdap/Td vaccine (2 - Td or Tdap) 05/31/2032   Pneumonia Vaccine  Completed   Hepatitis C Screening: USPSTF Recommendation to screen - Ages 24-79 yo.  Completed   HPV Vaccine  Aged Out   Colon Cancer Screening  Discontinued  *Topic was postponed. The date shown is not the original due date.    Advanced directives: Advance directive discussed with you today. I have provided a copy for you to complete at home and have notarized. Once this is complete please bring a copy in to our office so we can scan it into your chart.   Conditions/risks identified: Aim for 30 minutes of exercise or brisk walking, 6-8 glasses of water, and 5 servings of fruits and vegetables each day.   Next appointment: Follow  up in one year for your annual wellness visit    Preventive Care 65 Years and Older, Female Preventive care refers to lifestyle choices and visits with your health care provider that can promote health and wellness. What does preventive care include? A yearly physical exam. This is also called an annual well check. Dental exams once or twice a year. Routine eye exams. Ask your health care provider how often you should have your eyes checked. Personal lifestyle choices, including: Daily care of your teeth and gums. Regular physical activity. Eating a healthy  diet. Avoiding tobacco and drug use. Limiting alcohol use. Practicing safe sex. Taking low-dose aspirin every day. Taking vitamin and mineral supplements as recommended by your health care provider. What happens during an annual well check? The services and screenings done by your health care provider during your annual well check will depend on your age, overall health, lifestyle risk factors, and family history of disease. Counseling  Your health care provider may ask you questions about your: Alcohol use. Tobacco use. Drug use. Emotional well-being. Home and relationship well-being. Sexual activity. Eating habits. History of falls. Memory and ability to understand (cognition). Work and work Astronomer. Reproductive health. Screening  You may have the following tests or measurements: Height, weight, and BMI. Blood pressure. Lipid and cholesterol levels. These may be checked every 5 years, or more frequently if you are over 62 years old. Skin check. Lung cancer screening. You may have this screening every year starting at age 47 if you have a 30-pack-year history of smoking and currently smoke or have quit within the past 15 years. Fecal occult blood test (FOBT) of the stool. You may have this test every year starting at age 24. Flexible sigmoidoscopy or colonoscopy. You may have a sigmoidoscopy every 5 years or a colonoscopy every 10 years starting at age 67. Hepatitis C blood test. Hepatitis B blood test. Sexually transmitted disease (STD) testing. Diabetes screening. This is done by checking your blood sugar (glucose) after you have not eaten for a while (fasting). You may have this done every 1-3 years. Bone density scan. This is done to screen for osteoporosis. You may have this done starting at age 33. Mammogram. This may be done every 1-2 years. Talk to your health care provider about how often you should have regular mammograms. Talk with your health care provider about  your test results, treatment options, and if necessary, the need for more tests. Vaccines  Your health care provider may recommend certain vaccines, such as: Influenza vaccine. This is recommended every year. Tetanus, diphtheria, and acellular pertussis (Tdap, Td) vaccine. You may need a Td booster every 10 years. Zoster vaccine. You may need this after age 67. Pneumococcal 13-valent conjugate (PCV13) vaccine. One dose is recommended after age 29. Pneumococcal polysaccharide (PPSV23) vaccine. One dose is recommended after age 65. Talk to your health care provider about which screenings and vaccines you need and how often you need them. This information is not intended to replace advice given to you by your health care provider. Make sure you discuss any questions you have with your health care provider. Document Released: 07/15/2015 Document Revised: 03/07/2016 Document Reviewed: 04/19/2015 Elsevier Interactive Patient Education  2017 [employer].  Fall Prevention in the Home Falls can cause injuries. They can happen to people of all ages. There are many things you can do to make your home safe and to help prevent falls. What can I do on the outside of my home? Regularly  fix the edges of walkways and driveways and fix any cracks. Remove anything that might make you trip as you walk through a door, such as a raised step or threshold. Trim any bushes or trees on the path to your home. Use bright outdoor lighting. Clear any walking paths of anything that might make someone trip, such as rocks or tools. Regularly check to see if handrails are loose or broken. Make sure that both sides of any steps have handrails. Any raised decks and porches should have guardrails on the edges. Have any leaves, snow, or ice cleared regularly. Use sand or salt on walking paths during winter. Clean up any spills in your garage right away. This includes oil or grease spills. What can I do in the bathroom? Use  night lights. Install grab bars by the toilet and in the tub and shower. Do not use towel bars as grab bars. Use non-skid mats or decals in the tub or shower. If you need to sit down in the shower, use a plastic, non-slip stool. Keep the floor dry. Clean up any water that spills on the floor as soon as it happens. Remove soap buildup in the tub or shower regularly. Attach bath mats securely with double-sided non-slip rug tape. Do not have throw rugs and other things on the floor that can make you trip. What can I do in the bedroom? Use night lights. Make sure that you have a light by your bed that is easy to reach. Do not use any sheets or blankets that are too big for your bed. They should not hang down onto the floor. Have a firm chair that has side arms. You can use this for support while you get dressed. Do not have throw rugs and other things on the floor that can make you trip. What can I do in the kitchen? Clean up any spills right away. Avoid walking on wet floors. Keep items that you use a lot in easy-to-reach places. If you need to reach something above you, use a strong step stool that has a grab bar. Keep electrical cords out of the way. Do not use floor polish or wax that makes floors slippery. If you must use wax, use non-skid floor wax. Do not have throw rugs and other things on the floor that can make you trip. What can I do with my stairs? Do not leave any items on the stairs. Make sure that there are handrails on both sides of the stairs and use them. Fix handrails that are broken or loose. Make sure that handrails are as long as the stairways. Check any carpeting to make sure that it is firmly attached to the stairs. Fix any carpet that is loose or worn. Avoid having throw rugs at the top or bottom of the stairs. If you do have throw rugs, attach them to the floor with carpet tape. Make sure that you have a light switch at the top of the stairs and the bottom of the  stairs. If you do not have them, ask someone to add them for you. What else can I do to help prevent falls? Wear shoes that: Do not have high heels. Have rubber bottoms. Are comfortable and fit you well. Are closed at the toe. Do not wear sandals. If you use a stepladder: Make sure that it is fully opened. Do not climb a closed stepladder. Make sure that both sides of the stepladder are locked into place. Ask someone to hold  it for you, if possible. Clearly mark and make sure that you can see: Any grab bars or handrails. First and last steps. Where the edge of each step is. Use tools that help you move around (mobility aids) if they are needed. These include: Canes. Walkers. Scooters. Crutches. Turn on the lights when you go into a dark area. Replace any light bulbs as soon as they burn out. Set up your furniture so you have a clear path. Avoid moving your furniture around. If any of your floors are uneven, fix them. If there are any pets around you, be aware of where they are. Review your medicines with your doctor. Some medicines can make you feel dizzy. This can increase your chance of falling. Ask your doctor what other things that you can do to help prevent falls. This information is not intended to replace advice given to you by your health care provider. Make sure you discuss any questions you have with your health care provider. Document Released: 04/14/2009 Document Revised: 11/24/2015 Document Reviewed: 07/23/2014 Elsevier Interactive Patient Education  2017 [employer].

## 2022-11-15 NOTE — Progress Notes (Signed)
Subjective:   Kristen Munoz is a 77 y.o. female who presents for Medicare Annual (Subsequent) preventive examination. I connected with  Kristen Munoz on 11/15/22 by a audio enabled telemedicine application and verified that I am speaking with the correct person using two identifiers.  Patient Location: Home  Provider Location: Home Office  I discussed the limitations of evaluation and management by telemedicine. The patient expressed understanding and agreed to proceed.  Review of Systems     Cardiac Risk Factors include: advanced age (>72men, >82 women);dyslipidemia     Objective:    Today's Vitals   11/15/22 1526  Weight: 145 lb (65.8 kg)  Height: 5\' 3"  (1.6 m)   Body mass index is 25.69 kg/m.     11/15/2022    3:29 PM 02/27/2022    3:29 PM 02/13/2021    3:36 PM 01/06/2020   11:03 AM 12/04/2018    3:24 PM 06/27/2017    5:58 PM 10/07/2015    3:51 PM  Advanced Directives  Does Patient Have a Medical Advance Directive? No No No No No No No  Does patient want to make changes to medical advance directive?    No - Patient declined     Would patient like information on creating a medical advance directive? No - Patient declined No - Patient declined No - Patient declined  No - Patient declined  Yes - Educational materials given    Current Medications (verified) Outpatient Encounter Medications as of 11/15/2022  Medication Sig   aspirin EC 81 MG tablet Take 81 mg by mouth daily.   citalopram (CELEXA) 20 MG tablet Take 1 tablet (20 mg total) by mouth daily.   ferrous sulfate 325 (65 FE) MG tablet Take 1 tablet (325 mg total) by mouth daily with breakfast. (NEEDS TO BE SEEN BEFORE NEXT REFILL)   levothyroxine (EUTHYROX) 25 MCG tablet TAKE 1 TABLET BY MOUTH ONCE DAILY BEFORE BREAKFAST   lisinopril-hydrochlorothiazide (ZESTORETIC) 10-12.5 MG tablet TAKE 1 TABLET BY MOUTH ONCE DAILY (NEEDS TO BE SEEN BEFORE NEXT REFILL)   metoprolol tartrate (LOPRESSOR) 25 MG tablet Take 1/2 (one-half)  tablet by mouth twice daily   pantoprazole (PROTONIX) 40 MG tablet Take 1 tablet (40 mg total) by mouth daily. (NEEDS TO BE SEEN BEFORE NEXT REFILL)   rosuvastatin (CRESTOR) 10 MG tablet Take 1 tablet (10 mg total) by mouth daily.   No facility-administered encounter medications on file as of 11/15/2022.    Allergies (verified) Sulfa antibiotics   History: Past Medical History:  Diagnosis Date   CAD (coronary artery disease)    GERD (gastroesophageal reflux disease)    Hyperlipidemia    Hypertension    Insomnia    Osteoporosis    Syncopal episodes    Thyroid disease    Urinary bladder incontinence    Past Surgical History:  Procedure Laterality Date   ABDOMINAL HYSTERECTOMY     partial   bladder      baldder tack   THYROID SURGERY     VESICOVAGINAL FISTULA CLOSURE W/ TAH     Family History  Problem Relation Age of Onset   Cancer Mother        colon   Hypertension Mother    Diabetes Mother    Heart disease Mother    Cancer Father        lung   Diabetes Father    Hypertension Father    Heart disease Father    Cancer Sister  bone   Heart failure Sister    Birth defects Sister    Cancer Brother        kidney   Early death Brother        gun shot   Birth defects Brother    Heart failure Brother    Stevens-Johnson syndrome Sister    Anxiety disorder Sister    Depression Sister    Drug abuse Paternal Uncle    Thyroid disease Daughter    Heart attack Daughter    Diabetes Daughter    Social History   Socioeconomic History   Marital status: Widowed    Spouse name: Not on file   Number of children: 2   Years of education: 11th   Highest education level: Not on file  Occupational History   Occupation: RETIRED   Tobacco Use   Smoking status: Every Day    Packs/day: 1.00    Years: 38.00    Additional pack years: 0.00    Total pack years: 38.00    Types: Cigarettes    Start date: 07/02/1974   Smokeless tobacco: Never  Vaping Use   Vaping Use:  Never used  Substance and Sexual Activity   Alcohol use: No   Drug use: No   Sexual activity: Not Currently  Other Topics Concern   Not on file  Social History Narrative   Lives alone,   Right-handed.   Caffeine use: 2 cups coffee daily, 2-3 cans Pepsi daily   One daughter passed away, she has 2 daughters that live nearby   Social Determinants of Health   Financial Resource Strain: Low Risk  (11/15/2022)   Overall Financial Resource Strain (CARDIA)    Difficulty of Paying Living Expenses: Not hard at all  Food Insecurity: No Food Insecurity (11/15/2022)   Hunger Vital Sign    Worried About Running Out of Food in the Last Year: Never true    Ran Out of Food in the Last Year: Never true  Transportation Needs: No Transportation Needs (11/15/2022)   PRAPARE - Administrator, Civil Service (Medical): No    Lack of Transportation (Non-Medical): No  Physical Activity: Inactive (11/15/2022)   Exercise Vital Sign    Days of Exercise per Week: 0 days    Minutes of Exercise per Session: 0 min  Stress: No Stress Concern Present (11/15/2022)   [employer] of Occupational Health - Occupational Stress Questionnaire    Feeling of Stress : Not at all  Social Connections: Socially Isolated (11/15/2022)   Social Connection and Isolation Panel [NHANES]    Frequency of Communication with Friends and Family: More than three times a week    Frequency of Social Gatherings with Friends and Family: More than three times a week    Attends Religious Services: Never    Database administrator or Organizations: No    Attends Banker Meetings: Never    Marital Status: Widowed    Tobacco Counseling Ready to quit: No Counseling given: Not Answered   Clinical Intake:  Pre-visit preparation completed: Yes  Pain : No/denies pain     Nutritional Risks: None Diabetes: No  How often do you need to have someone help you when you read instructions, pamphlets, or other written  materials from your doctor or pharmacy?: 1 - Never  Diabetic?no   Interpreter Needed?: No  Information entered by :: Renie Ora, LPN   Activities of Daily Living    11/15/2022    3:29 PM  02/27/2022    3:35 PM  In your present state of health, do you have any difficulty performing the following activities:  Hearing? 0 0  Vision? 0 1  Difficulty concentrating or making decisions? 0 0  Walking or climbing stairs? 0 0  Dressing or bathing? 0 0  Doing errands, shopping? 0 0  Preparing Food and eating ? N N  Using the Toilet? N N  In the past six months, have you accidently leaked urine? N N  Do you have problems with loss of bowel control? N N  Managing your Medications? N N  Managing your Finances? N N  Housekeeping or managing your Housekeeping? N N    Patient Care Team: [name], FNP as PCP - General (Nurse Practitioner) Michaelle Copas, MD as Referring Physician (Optometry)  Indicate any recent Medical Services you may have received from other than [facility] providers in the past year (date may be approximate).     Assessment:   This is a routine wellness examination for Nayeli.  Hearing/Vision screen Vision Screening - Comments:: Wears rx glasses - up to date with routine eye exams with  Dr. Laural Benes   Dietary issues and exercise activities discussed: Current Exercise Habits: The patient does not participate in regular exercise at present   Goals Addressed             This Visit's Progress    DIET - INCREASE WATER INTAKE   On track    Pt is now drinking water, until last winter she would not drink water.       Depression Screen    11/15/2022    3:28 PM 05/31/2022    2:11 PM 02/27/2022    3:40 PM 11/28/2021    1:54 PM 05/29/2021   11:43 AM 02/13/2021    3:24 PM 12/01/2020    4:15 PM  PHQ 2/9 Scores  PHQ - 2 Score 0 1 0 1 0 2 0  PHQ- 9 Score  3  7 0 9     Fall Risk    11/15/2022    3:27 PM 05/31/2022    2:11 PM 02/27/2022    3:40 PM 11/28/2021     1:54 PM 05/29/2021   11:43 AM  Fall Risk   Falls in the past year? 0 0 0 0 0  Number falls in past yr: 0      Injury with Fall? 0      Risk for fall due to : No Fall Risks      Follow up Falls prevention discussed  Falls evaluation completed      FALL RISK PREVENTION PERTAINING TO THE HOME:  Any stairs in or around the home? Yes  If so, are there any without handrails? No  Home free of loose throw rugs in walkways, pet beds, electrical cords, etc? Yes  Adequate lighting in your home to reduce risk of falls? Yes   ASSISTIVE DEVICES UTILIZED TO PREVENT FALLS:  Life alert? No  Use of a cane, walker or w/c? No  Grab bars in the bathroom? No  Shower chair or bench in shower? No  Elevated toilet seat or a handicapped toilet? No       10/10/2016    4:09 PM 10/10/2016    4:02 PM 10/07/2015    4:01 PM 09/17/2014    4:35 PM  MMSE - Mini Mental State Exam  Orientation to time 5 5 5 5   Orientation to Place 5 5 5  5  Registration 3  3 3   Attention/ Calculation 4 4 3 5   Recall 2  3 3   Language- name 2 objects 2 2 2 2   Language- repeat 1 1 1 1   Language- follow 3 step command 3 3 3 3   Language- read & follow direction 1 1 1 1   Write a sentence 1 1 1 1   Copy design 0 0 1 1  Total score 27  28 30         11/15/2022    3:30 PM 02/27/2022    3:39 PM 02/13/2021    3:39 PM 01/06/2020   11:04 AM 12/04/2018    3:28 PM  6CIT Screen  What Year? 0 points 0 points 0 points 0 points 0 points  What month? 0 points 0 points 0 points 0 points 0 points  What time? 0 points 0 points 0 points 0 points 0 points  Count back from 20 0 points 0 points 0 points 0 points 0 points  Months in reverse 0 points 0 points 0 points 0 points 0 points  Repeat phrase 0 points 0 points 2 points 0 points 0 points  Total Score 0 points 0 points 2 points 0 points 0 points    Immunizations Immunization History  Administered Date(s) Administered   Fluad Quad(high Dose 65+) 06/02/2019, 05/29/2021, 05/31/2022    Influenza, High Dose Seasonal PF 04/18/2016, 05/20/2017, 05/09/2018   Influenza,inj,Quad PF,6+ Mos 04/22/2013, 06/05/2014, 03/31/2015   Moderna Sars-Covid-2 Vaccination 11/04/2019, 12/04/2019   Pneumococcal Conjugate-13 10/11/2014   Pneumococcal Polysaccharide-23 05/03/2011   Tdap 05/31/2022   Zoster, Live 09/17/2014    TDAP status: Up to date  Flu Vaccine status: Up to date  Pneumococcal vaccine status: Up to date  Covid-19 vaccine status: Completed vaccines  Qualifies for Shingles Vaccine? Yes   Zostavax completed No   Shingrix Completed?: No.    Education has been provided regarding the importance of this vaccine. Patient has been advised to call insurance company to determine out of pocket expense if they have not yet received this vaccine. Advised may also receive vaccine at local pharmacy or Health Dept. Verbalized acceptance and understanding.  Screening Tests Health Maintenance  Topic Date Due   Zoster Vaccines- Shingrix (1 of 2) Never done   Lung Cancer Screening  11/14/2017   COVID-19 Vaccine (3 - 2023-24 season) 03/02/2022   DEXA SCAN  06/30/2022   MAMMOGRAM  06/01/2023 (Originally 05/07/2017)   INFLUENZA VACCINE  01/31/2023   Medicare Annual Wellness (AWV)  11/15/2023   DTaP/Tdap/Td (2 - Td or Tdap) 05/31/2032   Pneumonia Vaccine 55+ Years old  Completed   Hepatitis C Screening  Completed   HPV VACCINES  Aged Out   COLONOSCOPY (Pts 45-19yrs Insurance coverage will need to be confirmed)  Discontinued    Health Maintenance  Health Maintenance Due  Topic Date Due   Zoster Vaccines- Shingrix (1 of 2) Never done   Lung Cancer Screening  11/14/2017   COVID-19 Vaccine (3 - 2023-24 season) 03/02/2022   DEXA SCAN  06/30/2022    Colorectal cancer screening: No longer required.   Mammogram status: No longer required due to age .  Bone Density status: Ordered 11/15/2022. Pt provided with contact info and advised to call to schedule appt.  Lung Cancer Screening:  (Low Dose CT Chest recommended if Age 104-80 years, 30 pack-year currently smoking OR have quit w/in 15years.) does not qualify.   Lung Cancer Screening Referral: n/a  Additional Screening:  Hepatitis C Screening: does not  qualify; Completed 08/25/2015  Vision Screening: Recommended annual ophthalmology exams for early detection of glaucoma and other disorders of the eye. Is the patient up to date with their annual eye exam?  Yes  Who is the provider or what is the name of the office in which the patient attends annual eye exams? Dr.Johnson  If pt is not established with a provider, would they like to be referred to a provider to establish care? No .   Dental Screening: Recommended annual dental exams for proper oral hygiene  Community Resource Referral / Chronic Care Management: CRR required this visit?  No   CCM required this visit?  No      Plan:     I have personally reviewed and noted the following in the patient's chart:   Medical and social history Use of alcohol, tobacco or illicit drugs  Current medications and supplements including opioid prescriptions. Patient is not currently taking opioid prescriptions. Functional ability and status Nutritional status Physical activity Advanced directives List of other physicians Hospitalizations, surgeries, and ER visits in previous 12 months Vitals Screenings to include cognitive, depression, and falls Referrals and appointments  In addition, I have reviewed and discussed with patient certain preventive protocols, quality metrics, and best practice recommendations. A written personalized care plan for preventive services as well as general preventive health recommendations were provided to patient.     Lorrene Reid, LPN   1/61/0960   Nurse Notes: none

## 2022-11-16 DIAGNOSIS — I10 Essential (primary) hypertension: Secondary | ICD-10-CM | POA: Diagnosis not present

## 2022-11-16 DIAGNOSIS — I272 Pulmonary hypertension, unspecified: Secondary | ICD-10-CM | POA: Diagnosis not present

## 2022-11-16 DIAGNOSIS — K219 Gastro-esophageal reflux disease without esophagitis: Secondary | ICD-10-CM | POA: Diagnosis not present

## 2022-11-16 DIAGNOSIS — N179 Acute kidney failure, unspecified: Secondary | ICD-10-CM | POA: Diagnosis not present

## 2022-11-16 DIAGNOSIS — S0232XD Fracture of orbital floor, left side, subsequent encounter for fracture with routine healing: Secondary | ICD-10-CM | POA: Diagnosis not present

## 2022-11-16 DIAGNOSIS — R079 Chest pain, unspecified: Secondary | ICD-10-CM | POA: Diagnosis not present

## 2022-11-16 DIAGNOSIS — W19XXXD Unspecified fall, subsequent encounter: Secondary | ICD-10-CM | POA: Diagnosis not present

## 2022-11-16 DIAGNOSIS — R55 Syncope and collapse: Secondary | ICD-10-CM | POA: Diagnosis not present

## 2022-11-16 DIAGNOSIS — I081 Rheumatic disorders of both mitral and tricuspid valves: Secondary | ICD-10-CM | POA: Diagnosis not present

## 2022-11-23 ENCOUNTER — Ambulatory Visit (INDEPENDENT_AMBULATORY_CARE_PROVIDER_SITE_OTHER): Payer: PPO | Admitting: Family Medicine

## 2022-11-23 ENCOUNTER — Encounter: Payer: Self-pay | Admitting: Family Medicine

## 2022-11-23 VITALS — BP 129/62 | HR 62 | Ht 63.0 in | Wt 144.0 lb

## 2022-11-23 DIAGNOSIS — R55 Syncope and collapse: Secondary | ICD-10-CM | POA: Diagnosis not present

## 2022-11-23 DIAGNOSIS — S0285XD Fracture of orbit, unspecified, subsequent encounter for fracture with routine healing: Secondary | ICD-10-CM

## 2022-11-23 DIAGNOSIS — S0285XA Fracture of orbit, unspecified, initial encounter for closed fracture: Secondary | ICD-10-CM

## 2022-11-23 DIAGNOSIS — N179 Acute kidney failure, unspecified: Secondary | ICD-10-CM | POA: Diagnosis not present

## 2022-11-23 LAB — CBC WITH DIFFERENTIAL/PLATELET
Basophils Absolute: 0.1 10*3/uL (ref 0.0–0.2)
EOS (ABSOLUTE): 0.1 10*3/uL (ref 0.0–0.4)
Eos: 1 %
Hematocrit: 34.9 % (ref 34.0–46.6)
Lymphocytes Absolute: 3.6 10*3/uL — ABNORMAL HIGH (ref 0.7–3.1)
MCHC: 33.2 g/dL (ref 31.5–35.7)
Monocytes Absolute: 0.7 10*3/uL (ref 0.1–0.9)
RBC: 3.79 x10E6/uL (ref 3.77–5.28)
RDW: 13.2 % (ref 11.7–15.4)

## 2022-11-23 LAB — CMP14+EGFR

## 2022-11-23 NOTE — Progress Notes (Signed)
BP 129/62   Pulse 62   Ht 5\' 3"  (1.6 m)   Wt 144 lb (65.3 kg)   SpO2 98%   BMI 25.51 kg/m    Subjective:   Patient ID: Kristen Munoz, female    DOB: Sep 17, 1945, 77 y.o.   MRN: [id number]  HPI: Kristen Munoz is a 77 y.o. female presenting on 11/23/2022 for Hospitalization Follow-up (Syncope from dehydration)   HPI Patient is coming in today for hospital follow-up for syncope and dehydration. Patient was admitted on 11/15/2022 and discharged on 11/16/2022.  She had a syncope and collapse and it looks like they diagnosed her with orbital fracture and left facial swelling.  It was suspected to be orthostatic in nature and echocardiogram did not find anything significant.  It looks like they referred her to ENT as well.  She did have mild AKI while she was in the hospital.  She says the day that she fell she was working outside and had not eaten anything or really drinking anything except a coffee and then she came in because she was feeling lightheaded and dizzy and sat down and then she fell in the kitchen.  She has done this about 4 times in the past year and it has always been related to her getting dehydrated.  Relevant past medical, surgical, family and social history reviewed and updated as indicated. Interim medical history since our last visit reviewed. Allergies and medications reviewed and updated.  Review of Systems  Constitutional:  Negative for chills and fever.  HENT:  Positive for facial swelling.   Eyes:  Negative for pain and visual disturbance.  Respiratory:  Negative for chest tightness and shortness of breath.   Cardiovascular:  Negative for chest pain and leg swelling.  Genitourinary:  Negative for difficulty urinating and dysuria.  Musculoskeletal:  Negative for back pain and gait problem.  Skin:  Positive for color change. Negative for rash and wound.  Neurological:  Negative for light-headedness and headaches.  Psychiatric/Behavioral:  Negative for agitation and  behavioral problems.   All other systems reviewed and are negative.   Per HPI unless specifically indicated above   Allergies as of 11/23/2022       Reactions   Sulfa Antibiotics Nausea Only        Medication List        Accurate as of Nov 23, 2022 10:20 AM. If you have any questions, ask your nurse or doctor.          aspirin EC 81 MG tablet Take 81 mg by mouth daily.   citalopram 20 MG tablet Commonly known as: CeleXA Take 1 tablet (20 mg total) by mouth daily.   ferrous sulfate 325 (65 FE) MG tablet Take 1 tablet (325 mg total) by mouth daily with breakfast. (NEEDS TO BE SEEN BEFORE NEXT REFILL)   levothyroxine 25 MCG tablet Commonly known as: Euthyrox TAKE 1 TABLET BY MOUTH ONCE DAILY BEFORE BREAKFAST   lisinopril-hydrochlorothiazide 10-12.5 MG tablet Commonly known as: ZESTORETIC TAKE 1 TABLET BY MOUTH ONCE DAILY (NEEDS TO BE SEEN BEFORE NEXT REFILL)   metoprolol tartrate 25 MG tablet Commonly known as: LOPRESSOR Take 1/2 (one-half) tablet by mouth twice daily   pantoprazole 40 MG tablet Commonly known as: PROTONIX Take 1 tablet (40 mg total) by mouth daily. (NEEDS TO BE SEEN BEFORE NEXT REFILL)   rosuvastatin 10 MG tablet Commonly known as: Crestor Take 1 tablet (10 mg total) by mouth daily.  Objective:   BP 129/62   Pulse 62   Ht 5\' 3"  (1.6 m)   Wt 144 lb (65.3 kg)   SpO2 98%   BMI 25.51 kg/m   Wt Readings from Last 3 Encounters:  11/23/22 144 lb (65.3 kg)  11/15/22 145 lb (65.8 kg)  05/31/22 142 lb (64.4 kg)    Physical Exam Vitals and nursing note reviewed.  Constitutional:      General: She is not in acute distress.    Appearance: She is well-developed. She is not diaphoretic.  Eyes:     Extraocular Movements: Extraocular movements intact.     Conjunctiva/sclera: Conjunctivae normal.     Pupils: Pupils are equal, round, and reactive to light.     Comments: No significant tenderness with range of motion in the eye.  EOM  intact  Cardiovascular:     Rate and Rhythm: Normal rate and regular rhythm.     Heart sounds: Normal heart sounds. No murmur heard. Pulmonary:     Effort: Pulmonary effort is normal. No respiratory distress.     Breath sounds: Normal breath sounds. No wheezing.  Musculoskeletal:        General: No tenderness. Normal range of motion.  Skin:    General: Skin is warm and dry.     Findings: Bruising (Bruising over left side of face from under down to her chin.  Does appear to be healing) present. No rash.  Neurological:     Mental Status: She is alert and oriented to person, place, and time.     Coordination: Coordination normal.  Psychiatric:        Behavior: Behavior normal.       Assessment & Plan:   Problem List Items Addressed This Visit   None Visit Diagnoses     Syncope and collapse    -  Primary   Relevant Orders   CBC with Differential/Platelet   CMP14+EGFR   Closed fracture of orbit with routine healing, subsequent encounter       AKI (acute kidney injury) (HCC)       Relevant Orders   CBC with Differential/Platelet   CMP14+EGFR   Hypomagnesemia       Relevant Orders   Magnesium       Will check blood work today, it does appear that her eye is healing.  She does have an ENT referral in place but no appointment yet, she will give another week and then call us if she still does not have anything from them.  Gave instructions on hydration and fall prevention Follow up plan: Return if symptoms worsen or fail to improve.  Counseling provided for all of the vaccine components Orders Placed This Encounter  Procedures   CBC with Differential/Platelet   CMP14+EGFR   Magnesium    Arville Care, MD [place] [facility] 11/23/2022, 10:20 AM

## 2022-11-24 LAB — CBC WITH DIFFERENTIAL/PLATELET
Basos: 1 %
Hemoglobin: 11.6 g/dL (ref 11.1–15.9)
Immature Grans (Abs): 0 10*3/uL (ref 0.0–0.1)
Immature Granulocytes: 0 %
Lymphs: 35 %
MCH: 30.6 pg (ref 26.6–33.0)
MCV: 92 fL (ref 79–97)
Monocytes: 7 %
Neutrophils Absolute: 5.7 10*3/uL (ref 1.4–7.0)
Neutrophils: 56 %
Platelets: 248 10*3/uL (ref 150–450)
WBC: 10.1 10*3/uL (ref 3.4–10.8)

## 2022-11-24 LAB — CMP14+EGFR
ALT: 5 IU/L (ref 0–32)
Albumin: 4.5 g/dL (ref 3.8–4.8)
Alkaline Phosphatase: 65 IU/L (ref 44–121)
BUN: 31 mg/dL — ABNORMAL HIGH (ref 8–27)
Bilirubin Total: 0.3 mg/dL (ref 0.0–1.2)
CO2: 23 mmol/L (ref 20–29)
Chloride: 101 mmol/L (ref 96–106)
Creatinine, Ser: 1.58 mg/dL — ABNORMAL HIGH (ref 0.57–1.00)
Globulin, Total: 2.6 g/dL (ref 1.5–4.5)
Potassium: 4.7 mmol/L (ref 3.5–5.2)
Sodium: 140 mmol/L (ref 134–144)

## 2022-11-24 LAB — MAGNESIUM: Magnesium: 1.5 mg/dL — ABNORMAL LOW (ref 1.6–2.3)

## 2022-11-28 ENCOUNTER — Other Ambulatory Visit: Payer: Self-pay

## 2022-11-28 DIAGNOSIS — R944 Abnormal results of kidney function studies: Secondary | ICD-10-CM

## 2022-12-03 ENCOUNTER — Other Ambulatory Visit: Payer: Self-pay | Admitting: Family Medicine

## 2022-12-03 ENCOUNTER — Telehealth: Payer: Self-pay | Admitting: Nurse Practitioner

## 2022-12-03 DIAGNOSIS — S0285XD Fracture of orbit, unspecified, subsequent encounter for fracture with routine healing: Secondary | ICD-10-CM

## 2022-12-03 NOTE — Progress Notes (Signed)
Placed new referral for ENT

## 2022-12-03 NOTE — Telephone Encounter (Signed)
Patient still has not heard from ENT. Can you please place referral for her

## 2022-12-03 NOTE — Telephone Encounter (Signed)
Patient prefers buy and bill and appointment given for 06/21.

## 2022-12-03 NOTE — Telephone Encounter (Signed)
ENT referral place. Patient aware

## 2022-12-07 DIAGNOSIS — S060X1D Concussion with loss of consciousness of 30 minutes or less, subsequent encounter: Secondary | ICD-10-CM | POA: Diagnosis not present

## 2022-12-10 ENCOUNTER — Ambulatory Visit: Payer: PPO | Admitting: Nurse Practitioner

## 2022-12-10 ENCOUNTER — Other Ambulatory Visit: Payer: PPO

## 2022-12-12 DIAGNOSIS — S0285XD Fracture of orbit, unspecified, subsequent encounter for fracture with routine healing: Secondary | ICD-10-CM | POA: Diagnosis not present

## 2022-12-21 ENCOUNTER — Ambulatory Visit (INDEPENDENT_AMBULATORY_CARE_PROVIDER_SITE_OTHER): Payer: PPO | Admitting: Nurse Practitioner

## 2022-12-21 ENCOUNTER — Ambulatory Visit (INDEPENDENT_AMBULATORY_CARE_PROVIDER_SITE_OTHER): Payer: PPO

## 2022-12-21 ENCOUNTER — Encounter: Payer: Self-pay | Admitting: Nurse Practitioner

## 2022-12-21 VITALS — BP 127/76 | HR 68 | Temp 97.0°F | Resp 20 | Ht 63.0 in | Wt 143.0 lb

## 2022-12-21 DIAGNOSIS — M8589 Other specified disorders of bone density and structure, multiple sites: Secondary | ICD-10-CM | POA: Diagnosis not present

## 2022-12-21 DIAGNOSIS — F411 Generalized anxiety disorder: Secondary | ICD-10-CM

## 2022-12-21 DIAGNOSIS — D509 Iron deficiency anemia, unspecified: Secondary | ICD-10-CM

## 2022-12-21 DIAGNOSIS — I2510 Atherosclerotic heart disease of native coronary artery without angina pectoris: Secondary | ICD-10-CM | POA: Diagnosis not present

## 2022-12-21 DIAGNOSIS — F3342 Major depressive disorder, recurrent, in full remission: Secondary | ICD-10-CM | POA: Diagnosis not present

## 2022-12-21 DIAGNOSIS — N393 Stress incontinence (female) (male): Secondary | ICD-10-CM | POA: Diagnosis not present

## 2022-12-21 DIAGNOSIS — E039 Hypothyroidism, unspecified: Secondary | ICD-10-CM

## 2022-12-21 DIAGNOSIS — I10 Essential (primary) hypertension: Secondary | ICD-10-CM

## 2022-12-21 DIAGNOSIS — E782 Mixed hyperlipidemia: Secondary | ICD-10-CM | POA: Diagnosis not present

## 2022-12-21 DIAGNOSIS — K219 Gastro-esophageal reflux disease without esophagitis: Secondary | ICD-10-CM

## 2022-12-21 DIAGNOSIS — Z780 Asymptomatic menopausal state: Secondary | ICD-10-CM | POA: Diagnosis not present

## 2022-12-21 DIAGNOSIS — F5101 Primary insomnia: Secondary | ICD-10-CM | POA: Diagnosis not present

## 2022-12-21 DIAGNOSIS — M810 Age-related osteoporosis without current pathological fracture: Secondary | ICD-10-CM | POA: Diagnosis not present

## 2022-12-21 MED ORDER — LISINOPRIL-HYDROCHLOROTHIAZIDE 10-12.5 MG PO TABS
ORAL_TABLET | ORAL | 1 refills | Status: DC
Start: 2022-12-21 — End: 2023-06-21

## 2022-12-21 MED ORDER — PANTOPRAZOLE SODIUM 40 MG PO TBEC: 40.0000 mg | DELAYED_RELEASE_TABLET | Freq: Every day | ORAL | 1 refills | Status: DC

## 2022-12-21 MED ORDER — ROSUVASTATIN CALCIUM 10 MG PO TABS
10.0000 mg | ORAL_TABLET | Freq: Every day | ORAL | 1 refills | Status: DC
Start: 2022-12-21 — End: 2023-06-21

## 2022-12-21 MED ORDER — FERROUS SULFATE 325 (65 FE) MG PO TABS: 325.0000 mg | ORAL_TABLET | Freq: Every day | ORAL | 1 refills | Status: DC

## 2022-12-21 MED ORDER — METOPROLOL TARTRATE 25 MG PO TABS: ORAL_TABLET | ORAL | 1 refills | Status: DC

## 2022-12-21 MED ORDER — CITALOPRAM HYDROBROMIDE 40 MG PO TABS
40.0000 mg | ORAL_TABLET | Freq: Every day | ORAL | 5 refills | Status: DC
Start: 2022-12-21 — End: 2023-06-21

## 2022-12-21 MED ORDER — DENOSUMAB 60 MG/ML ~~LOC~~ SOSY
60.0000 mg | PREFILLED_SYRINGE | Freq: Once | SUBCUTANEOUS | Status: AC
Start: 2022-12-21 — End: 2022-12-21
  Administered 2022-12-21: 60 mg via SUBCUTANEOUS

## 2022-12-21 MED ORDER — LEVOTHYROXINE SODIUM 25 MCG PO TABS: ORAL_TABLET | ORAL | 1 refills | Status: DC

## 2022-12-21 NOTE — Addendum Note (Signed)
Addended by: [name] on: 12/21/2022 02:59 PM   Modules accepted: Orders

## 2022-12-21 NOTE — Progress Notes (Signed)
Subjective:    Patient ID: Kristen Munoz, female    DOB: 09-10-1945, 77 y.o.   MRN: [id number]   Chief Complaint: medical management of chronic issues     HPI:  Kristen Munoz is a 77 y.o. who identifies as a female who was assigned female at birth.   Social history: Lives with: by herself- her granddaughter checks on her daily Work history: retired   Water engineer in today for follow up of the following chronic medical issues:  1. Essential hypertension, benign No c/o chest  pain, sob or headache . Does not check blood pressure at  home. BP Readings from Last 3 Encounters:  11/23/22 129/62  05/31/22 107/63  11/28/21 132/78    2. Mixed hyperlipidemia Does try to watch diet and stay active. But does no dedicated exercise. Lab Results  Component Value Date   CHOL 157 11/28/2021   HDL 54 11/28/2021   LDLCALC 68 11/28/2021   TRIG 214 (H) 11/28/2021   CHOLHDL 2.9 11/28/2021     3. Coronary artery disease involving native coronary artery of native heart without angina pectoris Has not seen cardiology in over 3 years.  4. Gastroesophageal reflux disease, unspecified whether esophagitis present Is on protonix and is doing well.  5. Acquired hypothyroidism No issues that she is aware of Lab Results  Component Value Date   TSH 0.861 11/28/2021     6. Recurrent major depressive disorder, in full remission (HCC) Is on celexa and is doing well.    12/21/2022    2:42 PM 11/23/2022    9:54 AM 11/15/2022    3:28 PM  Depression screen PHQ 2/9  Decreased Interest 1 1 0  Down, Depressed, Hopeless 1 1 0  PHQ - 2 Score 2 2 0  Altered sleeping 1 1   Tired, decreased energy 1 1   Change in appetite 1 1   Feeling bad or failure about yourself  1 1   Trouble concentrating 2 1   Moving slowly or fidgety/restless 1 1   Suicidal thoughts 1 1   PHQ-9 Score 10 9   Difficult doing work/chores Very difficult Somewhat difficult      7. GAD (generalized anxiety disorder) Celexa helps.  Worries aboyt dying all the time.    12/21/2022    2:43 PM 11/23/2022    9:55 AM 05/31/2022    2:11 PM 11/28/2021    1:54 PM  GAD 7 : Generalized Anxiety Score  Nervous, Anxious, on Edge 2 1 2 2   Control/stop worrying 2 1 2 2   Worry too much - different things 2 1 2 2   Trouble relaxing 1 1 2 2   Restless 2 1 2 2   Easily annoyed or irritable 2 1 2 2   Afraid - awful might happen 3 1 2 2   Total GAD 7 Score 14 7 14 14   Anxiety Difficulty Somewhat difficult Somewhat difficult Somewhat difficult Somewhat difficult      8. Primary insomnia She has been sleeping well as of late  9. Stress incontinence, female Wears depends-  10. Age-related osteoporosis without current pathological fracture No weight bearing exercise. Last dexascan was done on 06/29/20. T score was -1.9.   New complaints: Patient got dehydrated and passed out on 11/15/22. She has  a closed fracture of left orbit. She is doing much better.  Allergies  Allergen Reactions   Sulfa Antibiotics Nausea Only   Outpatient Encounter Medications as of 12/21/2022  Medication Sig   aspirin EC 81  MG tablet Take 81 mg by mouth daily.   citalopram (CELEXA) 20 MG tablet Take 1 tablet (20 mg total) by mouth daily.   ferrous sulfate 325 (65 FE) MG tablet Take 1 tablet (325 mg total) by mouth daily with breakfast. (NEEDS TO BE SEEN BEFORE NEXT REFILL)   levothyroxine (EUTHYROX) 25 MCG tablet TAKE 1 TABLET BY MOUTH ONCE DAILY BEFORE BREAKFAST   lisinopril-hydrochlorothiazide (ZESTORETIC) 10-12.5 MG tablet TAKE 1 TABLET BY MOUTH ONCE DAILY (NEEDS TO BE SEEN BEFORE NEXT REFILL)   metoprolol tartrate (LOPRESSOR) 25 MG tablet Take 1/2 (one-half) tablet by mouth twice daily   pantoprazole (PROTONIX) 40 MG tablet Take 1 tablet (40 mg total) by mouth daily. (NEEDS TO BE SEEN BEFORE NEXT REFILL)   rosuvastatin (CRESTOR) 10 MG tablet Take 1 tablet (10 mg total) by mouth daily.   No facility-administered encounter medications on file as of  12/21/2022.    Past Surgical History:  Procedure Laterality Date   ABDOMINAL HYSTERECTOMY     partial   bladder      baldder tack   THYROID SURGERY     VESICOVAGINAL FISTULA CLOSURE W/ TAH      Family History  Problem Relation Age of Onset   Cancer Mother        colon   Hypertension Mother    Diabetes Mother    Heart disease Mother    Cancer Father        lung   Diabetes Father    Hypertension Father    Heart disease Father    Cancer Sister        bone   Heart failure Sister    Birth defects Sister    Cancer Brother        kidney   Early death Brother        gun shot   Birth defects Brother    Heart failure Brother    Stevens-Johnson syndrome Sister    Anxiety disorder Sister    Depression Sister    Drug abuse Paternal Uncle    Thyroid disease Daughter    Heart attack Daughter    Diabetes Daughter       Controlled substance contract: n/a     Review of Systems  Constitutional:  Negative for diaphoresis.  Eyes:  Negative for pain.  Respiratory:  Negative for shortness of breath.   Cardiovascular:  Negative for chest pain, palpitations and leg swelling.  Gastrointestinal:  Negative for abdominal pain.  Endocrine: Negative for polydipsia.  Skin:  Negative for rash.  Neurological:  Negative for dizziness, weakness and headaches.  Hematological:  Does not bruise/bleed easily.  All other systems reviewed and are negative.      Objective:   Physical Exam Vitals and nursing note reviewed.  Constitutional:      General: She is not in acute distress.    Appearance: Normal appearance. She is well-developed.  HENT:     Head: Normocephalic.     Right Ear: Tympanic membrane normal.     Left Ear: Tympanic membrane normal.     Nose: Nose normal.     Mouth/Throat:     Mouth: Mucous membranes are moist.  Eyes:     Pupils: Pupils are equal, round, and reactive to light.  Neck:     Vascular: No carotid bruit or JVD.  Cardiovascular:     Rate and Rhythm:  Normal rate and regular rhythm.     Heart sounds: Normal heart sounds.  Pulmonary:  Effort: Pulmonary effort is normal. No respiratory distress.     Breath sounds: Normal breath sounds. No wheezing or rales.  Chest:     Chest wall: No tenderness.  Abdominal:     General: Bowel sounds are normal. There is no distension or abdominal bruit.     Palpations: Abdomen is soft. There is no hepatomegaly, splenomegaly, mass or pulsatile mass.     Tenderness: There is no abdominal tenderness.  Musculoskeletal:        General: Normal range of motion.     Cervical back: Normal range of motion and neck supple.  Lymphadenopathy:     Cervical: No cervical adenopathy.  Skin:    General: Skin is warm and dry.     Comments: Small hematoma left lateral eye orbit.  Neurological:     Mental Status: She is alert and oriented to person, place, and time.     Cranial Nerves: No cranial nerve deficit.     Sensory: No sensory deficit.     Deep Tendon Reflexes: Reflexes are normal and symmetric.  Psychiatric:        Behavior: Behavior normal.        Thought Content: Thought content normal.        Judgment: Judgment normal.    BP 127/76   Pulse 68   Temp (!) 97 F (36.1 C) (Temporal)   Resp 20   Ht 5\' 3"  (1.6 m)   Wt 143 lb (64.9 kg)   SpO2 97%   BMI 25.33 kg/m         Assessment & Plan:   HARLAN VINAL comes in today with chief complaint of Medical Management of Chronic Issues   Diagnosis and orders addressed:  1. Essential hypertension, benign Low sodium diet - lisinopril-hydrochlorothiazide (ZESTORETIC) 10-12.5 MG tablet; TAKE 1 TABLET BY MOUTH ONCE DAILY (NEEDS TO BE SEEN BEFORE NEXT REFILL)  Dispense: 90 tablet; Refill: 1 - metoprolol tartrate (LOPRESSOR) 25 MG tablet; Take 1/2 (one-half) tablet by mouth twice daily  Dispense: 90 tablet; Refill: 1  2. Mixed hyperlipidemia Low fat diet - rosuvastatin (CRESTOR) 10 MG tablet; Take 1 tablet (10 mg total) by mouth daily.  Dispense: 90  tablet; Refill: 1  3. Coronary artery disease involving native coronary artery of native heart without angina pectoris Avoid spicy foods Do not eat 2 hours prior to bedtime 4. Gastroesophageal reflux disease, unspecified whether esophagitis present Avoid spicy foods Do not eat 2 hours prior to bedtime  - pantoprazole (PROTONIX) 40 MG tablet; Take 1 tablet (40 mg total) by mouth daily. (NEEDS TO BE SEEN BEFORE NEXT REFILL)  Dispense: 90 tablet; Refill: 1  5. Acquired hypothyroidism Labs pending - levothyroxine (EUTHYROX) 25 MCG tablet; TAKE 1 TABLET BY MOUTH ONCE DAILY BEFORE BREAKFAST  Dispense: 90 tablet; Refill: 1  6. Recurrent major depressive disorder, in full remission (HCC) Stress management - citalopram (CELEXA) 40 MG tablet; Take 1 tablet (40 mg total) by mouth daily.  Dispense: 30 tablet; Refill: 5  7. GAD (generalized anxiety disorder) - citalopram (CELEXA) 40 MG tablet; Take 1 tablet (40 mg total) by mouth daily.  Dispense: 30 tablet; Refill: 5  8. Primary insomnia Bedtime routine  9. Stress incontinence, female  10. Age-related osteoporosis without current pathological fracture Weight bearing exercise  11. Iron deficiency anemia, unspecified iron deficiency anemia type Continue iro supplement - ferrous sulfate 325 (65 FE) MG tablet; Take 1 tablet (325 mg total) by mouth daily with breakfast. (NEEDS TO BE SEEN BEFORE  NEXT REFILL)  Dispense: 90 tablet; Refill: 1   Labs pending Health Maintenance reviewed Diet and exercise encouraged  Follow up plan: 6 months   Mary-Margaret Daphine Deutscher, FNP

## 2022-12-21 NOTE — Addendum Note (Signed)
Addended by: Cleda Daub on: 12/21/2022 04:32 PM   Modules accepted: Orders

## 2022-12-21 NOTE — Patient Instructions (Signed)
Fall Prevention in the Home, Adult Falls can cause injuries and can happen to people of all ages. There are many things you can do to make your home safer and to help prevent falls. What actions can I take to prevent falls? General information Use good lighting in all rooms. Make sure to: Replace any light bulbs that burn out. Turn on the lights in dark areas and use night-lights. Keep items that you use often in easy-to-reach places. Lower the shelves around your home if needed. Move furniture so that there are clear paths around it. Do not use throw rugs or other things on the floor that can make you trip. If any of your floors are uneven, fix them. Add color or contrast paint or tape to clearly mark and help you see: Grab bars or handrails. First and last steps of staircases. Where the edge of each step is. If you use a ladder or stepladder: Make sure that it is fully opened. Do not climb a closed ladder. Make sure the sides of the ladder are locked in place. Have someone hold the ladder while you use it. Know where your pets are as you move through your home. What can I do in the bathroom?     Keep the floor dry. Clean up any water on the floor right away. Remove soap buildup in the bathtub or shower. Buildup makes bathtubs and showers slippery. Use non-skid mats or decals on the floor of the bathtub or shower. Attach bath mats securely with double-sided, non-slip rug tape. If you need to sit down in the shower, use a non-slip stool. Install grab bars by the toilet and in the bathtub and shower. Do not use towel bars as grab bars. What can I do in the bedroom? Make sure that you have a light by your bed that is easy to reach. Do not use any sheets or blankets on your bed that hang to the floor. Have a firm chair or bench with side arms that you can use for support when you get dressed. What can I do in the kitchen? Clean up any spills right away. If you need to reach something  above you, use a step stool with a grab bar. Keep electrical cords out of the way. Do not use floor polish or wax that makes floors slippery. What can I do with my stairs? Do not leave anything on the stairs. Make sure that you have a light switch at the top and the bottom of the stairs. Make sure that there are handrails on both sides of the stairs. Fix handrails that are broken or loose. Install non-slip stair treads on all your stairs if they do not have carpet. Avoid having throw rugs at the top or bottom of the stairs. Choose a carpet that does not hide the edge of the steps on the stairs. Make sure that the carpet is firmly attached to the stairs. Fix carpet that is loose or worn. What can I do on the outside of my home? Use bright outdoor lighting. Fix the edges of walkways and driveways and fix any cracks. Clear paths of anything that can make you trip, such as tools or rocks. Add color or contrast paint or tape to clearly mark and help you see anything that might make you trip as you walk through a door, such as a raised step or threshold. Trim any bushes or trees on paths to your home. Check to see if handrails are loose   or broken and that both sides of all steps have handrails. Install guardrails along the edges of any raised decks and porches. Have leaves, snow, or ice cleared regularly. Use sand, salt, or ice melter on paths if you live where there is ice and snow during the winter. Clean up any spills in your garage right away. This includes grease or oil spills. What other actions can I take? Review your medicines with your doctor. Some medicines can cause dizziness or changes in blood pressure, which increase your risk of falling. Wear shoes that: Have a low heel. Do not wear high heels. Have rubber bottoms and are closed at the toe. Feel good on your feet and fit well. Use tools that help you move around if needed. These include: Canes. Walkers. Scooters. Crutches. Ask  your doctor what else you can do to help prevent falls. This may include seeing a physical therapist to learn to do exercises to move better and get stronger. Where to find more information Centers for Disease Control and Prevention, [employer]: cdc.gov National Institute on Aging: nia.nih.gov National Institute on Aging: nia.nih.gov Contact a doctor if: You are afraid of falling at home. You feel weak, drowsy, or dizzy at home. You fall at home. Get help right away if you: Lose consciousness or have trouble moving after a fall. Have a fall that causes a head injury. These symptoms may be an emergency. Get help right away. Call 911. Do not wait to see if the symptoms will go away. Do not drive yourself to the hospital. This information is not intended to replace advice given to you by your health care provider. Make sure you discuss any questions you have with your health care provider. Document Revised: 02/19/2022 Document Reviewed: 02/19/2022 Elsevier Patient Education  2024 [employer]  

## 2022-12-21 NOTE — Addendum Note (Signed)
Addended by: [name] on: 12/21/2022 02:56 PM   Modules accepted: Level of Service

## 2022-12-22 LAB — CBC WITH DIFFERENTIAL/PLATELET
Basophils Absolute: 0.1 10*3/uL (ref 0.0–0.2)
Basos: 1 %
EOS (ABSOLUTE): 0.1 10*3/uL (ref 0.0–0.4)
Eos: 1 %
Hematocrit: 33.7 % — ABNORMAL LOW (ref 34.0–46.6)
Hemoglobin: 11.3 g/dL (ref 11.1–15.9)
Immature Grans (Abs): 0.1 10*3/uL (ref 0.0–0.1)
Immature Granulocytes: 1 %
Lymphocytes Absolute: 3.8 10*3/uL — ABNORMAL HIGH (ref 0.7–3.1)
Lymphs: 40 %
MCH: 30.8 pg (ref 26.6–33.0)
MCHC: 33.5 g/dL (ref 31.5–35.7)
MCV: 92 fL (ref 79–97)
Monocytes Absolute: 0.7 10*3/uL (ref 0.1–0.9)
Monocytes: 8 %
Neutrophils Absolute: 4.8 10*3/uL (ref 1.4–7.0)
Neutrophils: 49 %
Platelets: 210 10*3/uL (ref 150–450)
RBC: 3.67 x10E6/uL — ABNORMAL LOW (ref 3.77–5.28)
RDW: 13 % (ref 11.7–15.4)
WBC: 9.5 10*3/uL (ref 3.4–10.8)

## 2022-12-22 LAB — CMP14+EGFR
ALT: 7 IU/L (ref 0–32)
AST: 10 IU/L (ref 0–40)
Albumin: 4.4 g/dL (ref 3.8–4.8)
Alkaline Phosphatase: 67 IU/L (ref 44–121)
BUN/Creatinine Ratio: 19 (ref 12–28)
BUN: 27 mg/dL (ref 8–27)
Bilirubin Total: 0.3 mg/dL (ref 0.0–1.2)
CO2: 19 mmol/L — ABNORMAL LOW (ref 20–29)
Calcium: 8.6 mg/dL — ABNORMAL LOW (ref 8.7–10.3)
Chloride: 103 mmol/L (ref 96–106)
Creatinine, Ser: 1.42 mg/dL — ABNORMAL HIGH (ref 0.57–1.00)
Globulin, Total: 2.8 g/dL (ref 1.5–4.5)
Glucose: 106 mg/dL — ABNORMAL HIGH (ref 70–99)
Potassium: 4.2 mmol/L (ref 3.5–5.2)
Sodium: 138 mmol/L (ref 134–144)
Total Protein: 7.2 g/dL (ref 6.0–8.5)
eGFR: 38 mL/min/{1.73_m2} — ABNORMAL LOW (ref >59)

## 2022-12-22 LAB — LIPID PANEL
Chol/HDL Ratio: 3.2 ratio (ref 0.0–4.4)
Cholesterol, Total: 160 mg/dL (ref 100–199)
HDL: 50 mg/dL (ref >39)
LDL Chol Calc (NIH): 81 mg/dL (ref 0–99)
Triglycerides: 171 mg/dL — ABNORMAL HIGH (ref 0–149)
VLDL Cholesterol Cal: 29 mg/dL (ref 5–40)

## 2022-12-25 DIAGNOSIS — R55 Syncope and collapse: Secondary | ICD-10-CM | POA: Diagnosis not present

## 2022-12-25 DIAGNOSIS — I272 Pulmonary hypertension, unspecified: Secondary | ICD-10-CM | POA: Diagnosis not present

## 2022-12-25 DIAGNOSIS — I25119 Atherosclerotic heart disease of native coronary artery with unspecified angina pectoris: Secondary | ICD-10-CM | POA: Diagnosis not present

## 2022-12-25 DIAGNOSIS — I10 Essential (primary) hypertension: Secondary | ICD-10-CM | POA: Diagnosis not present

## 2023-01-22 DIAGNOSIS — R079 Chest pain, unspecified: Secondary | ICD-10-CM | POA: Diagnosis not present

## 2023-01-22 DIAGNOSIS — I25119 Atherosclerotic heart disease of native coronary artery with unspecified angina pectoris: Secondary | ICD-10-CM | POA: Diagnosis not present

## 2023-01-22 DIAGNOSIS — R55 Syncope and collapse: Secondary | ICD-10-CM | POA: Diagnosis not present

## 2023-06-13 ENCOUNTER — Telehealth: Payer: Self-pay

## 2023-06-13 DIAGNOSIS — M810 Age-related osteoporosis without current pathological fracture: Secondary | ICD-10-CM

## 2023-06-13 MED ORDER — DENOSUMAB 60 MG/ML ~~LOC~~ SOSY
60.0000 mg | PREFILLED_SYRINGE | Freq: Once | SUBCUTANEOUS | Status: AC
Start: 2023-06-27 — End: 2023-08-07
  Administered 2023-08-07: 60 mg via SUBCUTANEOUS

## 2023-06-13 NOTE — Telephone Encounter (Signed)
Prolia ordered for PA team to review verification benefits

## 2023-06-21 ENCOUNTER — Encounter: Payer: Self-pay | Admitting: Nurse Practitioner

## 2023-06-21 ENCOUNTER — Ambulatory Visit (INDEPENDENT_AMBULATORY_CARE_PROVIDER_SITE_OTHER): Payer: PPO | Admitting: Nurse Practitioner

## 2023-06-21 VITALS — BP 142/76 | HR 73 | Temp 97.5°F | Ht 63.0 in | Wt 141.0 lb

## 2023-06-21 DIAGNOSIS — J069 Acute upper respiratory infection, unspecified: Secondary | ICD-10-CM

## 2023-06-21 DIAGNOSIS — M810 Age-related osteoporosis without current pathological fracture: Secondary | ICD-10-CM

## 2023-06-21 DIAGNOSIS — F3342 Major depressive disorder, recurrent, in full remission: Secondary | ICD-10-CM | POA: Diagnosis not present

## 2023-06-21 DIAGNOSIS — I10 Essential (primary) hypertension: Secondary | ICD-10-CM | POA: Diagnosis not present

## 2023-06-21 DIAGNOSIS — E782 Mixed hyperlipidemia: Secondary | ICD-10-CM

## 2023-06-21 DIAGNOSIS — E039 Hypothyroidism, unspecified: Secondary | ICD-10-CM | POA: Diagnosis not present

## 2023-06-21 DIAGNOSIS — F411 Generalized anxiety disorder: Secondary | ICD-10-CM

## 2023-06-21 DIAGNOSIS — K219 Gastro-esophageal reflux disease without esophagitis: Secondary | ICD-10-CM

## 2023-06-21 DIAGNOSIS — N393 Stress incontinence (female) (male): Secondary | ICD-10-CM | POA: Diagnosis not present

## 2023-06-21 DIAGNOSIS — F5101 Primary insomnia: Secondary | ICD-10-CM

## 2023-06-21 DIAGNOSIS — D509 Iron deficiency anemia, unspecified: Secondary | ICD-10-CM | POA: Diagnosis not present

## 2023-06-21 MED ORDER — METOPROLOL TARTRATE 25 MG PO TABS
ORAL_TABLET | ORAL | 1 refills | Status: DC
Start: 2023-06-21 — End: 2024-03-06

## 2023-06-21 MED ORDER — PANTOPRAZOLE SODIUM 40 MG PO TBEC: 40.0000 mg | DELAYED_RELEASE_TABLET | Freq: Every day | ORAL | 1 refills | Status: DC

## 2023-06-21 MED ORDER — LISINOPRIL-HYDROCHLOROTHIAZIDE 10-12.5 MG PO TABS
ORAL_TABLET | ORAL | 1 refills | Status: DC
Start: 2023-06-21 — End: 2024-02-12

## 2023-06-21 MED ORDER — FERROUS SULFATE 325 (65 FE) MG PO TABS: 325.0000 mg | ORAL_TABLET | Freq: Every day | ORAL | 1 refills | Status: DC

## 2023-06-21 MED ORDER — BENZONATATE 100 MG PO CAPS
100.0000 mg | ORAL_CAPSULE | Freq: Three times a day (TID) | ORAL | 0 refills | Status: DC | PRN
Start: 2023-06-21 — End: 2024-03-06

## 2023-06-21 MED ORDER — CITALOPRAM HYDROBROMIDE 40 MG PO TABS
40.0000 mg | ORAL_TABLET | Freq: Every day | ORAL | 1 refills | Status: DC
Start: 2023-06-21 — End: 2024-03-06

## 2023-06-21 MED ORDER — ROSUVASTATIN CALCIUM 10 MG PO TABS: 10.0000 mg | ORAL_TABLET | Freq: Every day | ORAL | 1 refills | Status: DC

## 2023-06-21 MED ORDER — AZITHROMYCIN 250 MG PO TABS
ORAL_TABLET | ORAL | 0 refills | Status: DC
Start: 2023-06-21 — End: 2024-03-06

## 2023-06-21 MED ORDER — LEVOTHYROXINE SODIUM 25 MCG PO TABS
ORAL_TABLET | ORAL | 1 refills | Status: DC
Start: 2023-06-21 — End: 2024-03-06

## 2023-06-21 NOTE — Progress Notes (Signed)
Subjective:    Patient ID: Kristen Munoz, female    DOB: 1945-11-20, 77 y.o.   MRN: [id number]   Chief Complaint: medical management of chronic issues     HPI:  Kristen Munoz is a 77 y.o. who identifies as a female who was assigned female at birth.   Social history: Lives with: by herself- her granddaughter checks on her daily Work history: retired   Water engineer in today for follow up of the following chronic medical issues:  1. Essential hypertension, benign No c/o chest  pain, sob or headache . Does not check blood pressure at  home. BP Readings from Last 3 Encounters:  06/21/23 (!) 177/81  12/21/22 127/76  11/23/22 129/62    2. Mixed hyperlipidemia Does try to watch diet and stay active. But does no dedicated exercise. Lab Results  Component Value Date   CHOL 160 12/21/2022   HDL 50 12/21/2022   LDLCALC 81 12/21/2022   TRIG 171 (H) 12/21/2022   CHOLHDL 3.2 12/21/2022     3. Coronary artery disease involving native coronary artery of native heart without angina pectoris Has not seen cardiology in over 3 years.  4. Gastroesophageal reflux disease, unspecified whether esophagitis present Is on protonix and is doing well.  5. Acquired hypothyroidism No issues that she is aware of Lab Results  Component Value Date   TSH 0.861 11/28/2021     6. Recurrent major depressive disorder, in full remission (HCC) Is on celexa and is doing well.    06/21/2023    2:53 PM 12/21/2022    2:42 PM 11/23/2022    9:54 AM  Depression screen PHQ 2/9  Decreased Interest 2 1 1   Down, Depressed, Hopeless 2 1 1   PHQ - 2 Score 4 2 2   Altered sleeping 2 1 1   Tired, decreased energy 2 1 1   Change in appetite 2 1 1   Feeling bad or failure about yourself  2 1 1   Trouble concentrating 2 2 1   Moving slowly or fidgety/restless 2 1 1   Suicidal thoughts 2 1 1   PHQ-9 Score 18 10 9   Difficult doing work/chores Somewhat difficult Very difficult Somewhat difficult     7. GAD (generalized  anxiety disorder) Celexa helps. Worries aboyt dying all the time.    06/21/2023    2:53 PM 12/21/2022    2:43 PM 11/23/2022    9:55 AM 05/31/2022    2:11 PM  GAD 7 : Generalized Anxiety Score  Nervous, Anxious, on Edge 2 2 1 2   Control/stop worrying 2 2 1 2   Worry too much - different things 2 2 1 2   Trouble relaxing 2 1 1 2   Restless 2 2 1 2   Easily annoyed or irritable 2 2 1 2   Afraid - awful might happen 2 3 1 2   Total GAD 7 Score 14 14 7 14   Anxiety Difficulty Somewhat difficult Somewhat difficult Somewhat difficult Somewhat difficult      8. Primary insomnia She has been sleeping well as of late  9. Stress incontinence, female Wears depends-  10. Age-related osteoporosis without current pathological fracture No weight bearing exercise. Last dexascan was done on 06/29/20. T score was -1.9.   New complaints: Cough and congestion for over a week. Cough is worse at night. Had fever and is sweating at night  Allergies  Allergen Reactions   Sulfa Antibiotics Nausea Only   Outpatient Encounter Medications as of 06/21/2023  Medication Sig   aspirin EC 81 MG  tablet Take 81 mg by mouth daily.   citalopram (CELEXA) 40 MG tablet Take 1 tablet (40 mg total) by mouth daily.   ferrous sulfate 325 (65 FE) MG tablet Take 1 tablet (325 mg total) by mouth daily with breakfast. (NEEDS TO BE SEEN BEFORE NEXT REFILL)   levothyroxine (EUTHYROX) 25 MCG tablet TAKE 1 TABLET BY MOUTH ONCE DAILY BEFORE BREAKFAST   lisinopril-hydrochlorothiazide (ZESTORETIC) 10-12.5 MG tablet TAKE 1 TABLET BY MOUTH ONCE DAILY (NEEDS TO BE SEEN BEFORE NEXT REFILL)   metoprolol tartrate (LOPRESSOR) 25 MG tablet Take 1/2 (one-half) tablet by mouth twice daily   pantoprazole (PROTONIX) 40 MG tablet Take 1 tablet (40 mg total) by mouth daily. (NEEDS TO BE SEEN BEFORE NEXT REFILL)   rosuvastatin (CRESTOR) 10 MG tablet Take 1 tablet (10 mg total) by mouth daily.   Facility-Administered Encounter Medications as of  06/21/2023  Medication   [START ON 06/27/2023] denosumab (PROLIA) injection 60 mg    Past Surgical History:  Procedure Laterality Date   ABDOMINAL HYSTERECTOMY     partial   bladder      baldder tack   THYROID SURGERY     VESICOVAGINAL FISTULA CLOSURE W/ TAH      Family History  Problem Relation Age of Onset   Cancer Mother        colon   Hypertension Mother    Diabetes Mother    Heart disease Mother    Cancer Father        lung   Diabetes Father    Hypertension Father    Heart disease Father    Cancer Sister        bone   Heart failure Sister    Birth defects Sister    Cancer Brother        kidney   Early death Brother        gun shot   Birth defects Brother    Heart failure Brother    Stevens-Johnson syndrome Sister    Anxiety disorder Sister    Depression Sister    Drug abuse Paternal Uncle    Thyroid disease Daughter    Heart attack Daughter    Diabetes Daughter       Controlled substance contract: n/a     Review of Systems  Constitutional:  Negative for diaphoresis.  Eyes:  Negative for pain.  Respiratory:  Negative for shortness of breath.   Cardiovascular:  Negative for chest pain, palpitations and leg swelling.  Gastrointestinal:  Negative for abdominal pain.  Endocrine: Negative for polydipsia.  Skin:  Negative for rash.  Neurological:  Negative for dizziness, weakness and headaches.  Hematological:  Does not bruise/bleed easily.  All other systems reviewed and are negative.      Objective:   Physical Exam Vitals and nursing note reviewed.  Constitutional:      General: She is not in acute distress.    Appearance: Normal appearance. She is well-developed.  HENT:     Head: Normocephalic.     Right Ear: Tympanic membrane normal.     Left Ear: Tympanic membrane normal.     Nose: Nose normal.     Mouth/Throat:     Mouth: Mucous membranes are moist.  Eyes:     Pupils: Pupils are equal, round, and reactive to light.  Neck:      Vascular: No carotid bruit or JVD.  Cardiovascular:     Rate and Rhythm: Normal rate and regular rhythm.     Heart sounds:  Normal heart sounds.  Pulmonary:     Effort: Pulmonary effort is normal. No respiratory distress.     Breath sounds: Normal breath sounds. No wheezing or rales.  Chest:     Chest wall: No tenderness.  Abdominal:     General: Bowel sounds are normal. There is no distension or abdominal bruit.     Palpations: Abdomen is soft. There is no hepatomegaly, splenomegaly, mass or pulsatile mass.     Tenderness: There is no abdominal tenderness.  Musculoskeletal:        General: Normal range of motion.     Cervical back: Normal range of motion and neck supple.  Lymphadenopathy:     Cervical: No cervical adenopathy.  Skin:    General: Skin is warm and dry.     Comments: Small hematoma left lateral eye orbit.  Neurological:     Mental Status: She is alert and oriented to person, place, and time.     Cranial Nerves: No cranial nerve deficit.     Sensory: No sensory deficit.     Deep Tendon Reflexes: Reflexes are normal and symmetric.  Psychiatric:        Behavior: Behavior normal.        Thought Content: Thought content normal.        Judgment: Judgment normal.    BP (!) 142/76 (BP Location: Right Arm, Cuff Size: Normal)   Pulse 73   Temp (!) 97.5 F (36.4 C) (Temporal)   Ht 5\' 3"  (1.6 m)   Wt 141 lb (64 kg)   SpO2 96%   BMI 24.98 kg/m           Assessment & Plan:   Kristen Munoz comes in today with chief complaint of Medical Management of Chronic Issues (Sinus problem/)   Diagnosis and orders addressed:  1. Essential hypertension, benign Low sodium diet - lisinopril-hydrochlorothiazide (ZESTORETIC) 10-12.5 MG tablet; TAKE 1 TABLET BY MOUTH ONCE DAILY (NEEDS TO BE SEEN BEFORE NEXT REFILL)  Dispense: 90 tablet; Refill: 1 - metoprolol tartrate (LOPRESSOR) 25 MG tablet; Take 1/2 (one-half) tablet by mouth twice daily  Dispense: 90 tablet; Refill: 1  2.  Mixed hyperlipidemia Low fat diet - rosuvastatin (CRESTOR) 10 MG tablet; Take 1 tablet (10 mg total) by mouth daily.  Dispense: 90 tablet; Refill: 1  3. Coronary artery disease involving native coronary artery of native heart without angina pectoris Avoid spicy foods Do not eat 2 hours prior to bedtime 4. Gastroesophageal reflux disease, unspecified whether esophagitis present Avoid spicy foods Do not eat 2 hours prior to bedtime  - pantoprazole (PROTONIX) 40 MG tablet; Take 1 tablet (40 mg total) by mouth daily. (NEEDS TO BE SEEN BEFORE NEXT REFILL)  Dispense: 90 tablet; Refill: 1  5. Acquired hypothyroidism Labs pending - levothyroxine (EUTHYROX) 25 MCG tablet; TAKE 1 TABLET BY MOUTH ONCE DAILY BEFORE BREAKFAST  Dispense: 90 tablet; Refill: 1  6. Recurrent major depressive disorder, in full remission (HCC) Stress management - citalopram (CELEXA) 40 MG tablet; Take 1 tablet (40 mg total) by mouth daily.  Dispense: 30 tablet; Refill: 5  7. GAD (generalized anxiety disorder) - citalopram (CELEXA) 40 MG tablet; Take 1 tablet (40 mg total) by mouth daily.  Dispense: 30 tablet; Refill: 5  8. Primary insomnia Bedtime routine  9. Stress incontinence, female  10. Age-related osteoporosis without current pathological fracture Weight bearing exercise  11. Iron deficiency anemia, unspecified iron deficiency anemia type Continue iro supplement - ferrous sulfate 325 (65 FE) MG  tablet; Take 1 tablet (325 mg total) by mouth daily with breakfast. (NEEDS TO BE SEEN BEFORE NEXT REFILL)  Dispense: 90 tablet; Refill: 1  12. Uri with cough and congestion 1. Take meds as prescribed 2. Use a cool mist humidifier especially during the winter months and when heat has been humid. 3. Use saline nose sprays frequently 4. Saline irrigations of the nose can be very helpful if done frequently.  * 4X daily for 1 week*  * Use of a nettie pot can be helpful with this. Follow directions with this* 5. Drink  plenty of fluids 6. Keep thermostat turn down low 7.For any cough or congestion- tessalon perles 8. For fever or aces or pains- take tylenol or ibuprofen appropriate for age and weight.  * for fevers greater than 101 orally you may alternate ibuprofen and tylenol every  3 hours. Z pak and tessalon perles    Labs pending Health Maintenance reviewed Diet and exercise encouraged  Follow up plan: 6 months   Mary-Margaret Daphine Deutscher, FNP

## 2023-06-21 NOTE — Patient Instructions (Signed)
Fall Prevention in the Home, Adult Falls can cause injuries and can happen to people of all ages. There are many things you can do to make your home safer and to help prevent falls. What actions can I take to prevent falls? General information Use good lighting in all rooms. Make sure to: Replace any light bulbs that burn out. Turn on the lights in dark areas and use night-lights. Keep items that you use often in easy-to-reach places. Lower the shelves around your home if needed. Move furniture so that there are clear paths around it. Do not use throw rugs or other things on the floor that can make you trip. If any of your floors are uneven, fix them. Add color or contrast paint or tape to clearly mark and help you see: Grab bars or handrails. First and last steps of staircases. Where the edge of each step is. If you use a ladder or stepladder: Make sure that it is fully opened. Do not climb a closed ladder. Make sure the sides of the ladder are locked in place. Have someone hold the ladder while you use it. Know where your pets are as you move through your home. What can I do in the bathroom?     Keep the floor dry. Clean up any water on the floor right away. Remove soap buildup in the bathtub or shower. Buildup makes bathtubs and showers slippery. Use non-skid mats or decals on the floor of the bathtub or shower. Attach bath mats securely with double-sided, non-slip rug tape. If you need to sit down in the shower, use a non-slip stool. Install grab bars by the toilet and in the bathtub and shower. Do not use towel bars as grab bars. What can I do in the bedroom? Make sure that you have a light by your bed that is easy to reach. Do not use any sheets or blankets on your bed that hang to the floor. Have a firm chair or bench with side arms that you can use for support when you get dressed. What can I do in the kitchen? Clean up any spills right away. If you need to reach something  above you, use a step stool with a grab bar. Keep electrical cords out of the way. Do not use floor polish or wax that makes floors slippery. What can I do with my stairs? Do not leave anything on the stairs. Make sure that you have a light switch at the top and the bottom of the stairs. Make sure that there are handrails on both sides of the stairs. Fix handrails that are broken or loose. Install non-slip stair treads on all your stairs if they do not have carpet. Avoid having throw rugs at the top or bottom of the stairs. Choose a carpet that does not hide the edge of the steps on the stairs. Make sure that the carpet is firmly attached to the stairs. Fix carpet that is loose or worn. What can I do on the outside of my home? Use bright outdoor lighting. Fix the edges of walkways and driveways and fix any cracks. Clear paths of anything that can make you trip, such as tools or rocks. Add color or contrast paint or tape to clearly mark and help you see anything that might make you trip as you walk through a door, such as a raised step or threshold. Trim any bushes or trees on paths to your home. Check to see if handrails are loose   or broken and that both sides of all steps have handrails. Install guardrails along the edges of any raised decks and porches. Have leaves, snow, or ice cleared regularly. Use sand, salt, or ice melter on paths if you live where there is ice and snow during the winter. Clean up any spills in your garage right away. This includes grease or oil spills. What other actions can I take? Review your medicines with your doctor. Some medicines can cause dizziness or changes in blood pressure, which increase your risk of falling. Wear shoes that: Have a low heel. Do not wear high heels. Have rubber bottoms and are closed at the toe. Feel good on your feet and fit well. Use tools that help you move around if needed. These include: Canes. Walkers. Scooters. Crutches. Ask  your doctor what else you can do to help prevent falls. This may include seeing a physical therapist to learn to do exercises to move better and get stronger. Where to find more information Centers for Disease Control and Prevention, [employer]: cdc.gov National Institute on Aging: nia.nih.gov National Institute on Aging: nia.nih.gov Contact a doctor if: You are afraid of falling at home. You feel weak, drowsy, or dizzy at home. You fall at home. Get help right away if you: Lose consciousness or have trouble moving after a fall. Have a fall that causes a head injury. These symptoms may be an emergency. Get help right away. Call 911. Do not wait to see if the symptoms will go away. Do not drive yourself to the hospital. This information is not intended to replace advice given to you by your health care provider. Make sure you discuss any questions you have with your health care provider. Document Revised: 02/19/2022 Document Reviewed: 02/19/2022 Elsevier Patient Education  2024 [employer]  

## 2023-06-22 LAB — THYROID PANEL WITH TSH
Free Thyroxine Index: 2.5 (ref 1.2–4.9)
T3 Uptake Ratio: 27 % (ref 24–39)
T4, Total: 9.4 ug/dL (ref 4.5–12.0)
TSH: 0.593 u[IU]/mL (ref 0.450–4.500)

## 2023-08-07 ENCOUNTER — Ambulatory Visit (INDEPENDENT_AMBULATORY_CARE_PROVIDER_SITE_OTHER): Payer: PPO

## 2023-08-07 DIAGNOSIS — M810 Age-related osteoporosis without current pathological fracture: Secondary | ICD-10-CM

## 2023-10-19 DIAGNOSIS — K219 Gastro-esophageal reflux disease without esophagitis: Secondary | ICD-10-CM | POA: Diagnosis not present

## 2023-10-19 DIAGNOSIS — I10 Essential (primary) hypertension: Secondary | ICD-10-CM | POA: Diagnosis not present

## 2023-10-19 DIAGNOSIS — R9431 Abnormal electrocardiogram [ECG] [EKG]: Secondary | ICD-10-CM | POA: Diagnosis not present

## 2023-10-19 DIAGNOSIS — R079 Chest pain, unspecified: Secondary | ICD-10-CM | POA: Diagnosis not present

## 2023-10-19 DIAGNOSIS — R918 Other nonspecific abnormal finding of lung field: Secondary | ICD-10-CM | POA: Diagnosis not present

## 2023-10-19 DIAGNOSIS — M542 Cervicalgia: Secondary | ICD-10-CM | POA: Diagnosis not present

## 2023-10-19 DIAGNOSIS — F17210 Nicotine dependence, cigarettes, uncomplicated: Secondary | ICD-10-CM | POA: Diagnosis not present

## 2023-10-19 DIAGNOSIS — R0989 Other specified symptoms and signs involving the circulatory and respiratory systems: Secondary | ICD-10-CM | POA: Diagnosis not present

## 2023-10-19 DIAGNOSIS — Z882 Allergy status to sulfonamides status: Secondary | ICD-10-CM | POA: Diagnosis not present

## 2023-10-19 DIAGNOSIS — Z7982 Long term (current) use of aspirin: Secondary | ICD-10-CM | POA: Diagnosis not present

## 2023-10-19 DIAGNOSIS — F419 Anxiety disorder, unspecified: Secondary | ICD-10-CM | POA: Diagnosis not present

## 2023-10-19 DIAGNOSIS — Z79890 Hormone replacement therapy (postmenopausal): Secondary | ICD-10-CM | POA: Diagnosis not present

## 2023-10-19 DIAGNOSIS — Z79899 Other long term (current) drug therapy: Secondary | ICD-10-CM | POA: Diagnosis not present

## 2023-10-19 DIAGNOSIS — R001 Bradycardia, unspecified: Secondary | ICD-10-CM | POA: Diagnosis not present

## 2023-10-19 DIAGNOSIS — I2510 Atherosclerotic heart disease of native coronary artery without angina pectoris: Secondary | ICD-10-CM | POA: Diagnosis not present

## 2023-10-19 DIAGNOSIS — E079 Disorder of thyroid, unspecified: Secondary | ICD-10-CM | POA: Diagnosis not present

## 2023-10-24 DIAGNOSIS — R079 Chest pain, unspecified: Secondary | ICD-10-CM | POA: Insufficient documentation

## 2023-10-24 DIAGNOSIS — I10 Essential (primary) hypertension: Secondary | ICD-10-CM | POA: Diagnosis not present

## 2023-10-24 DIAGNOSIS — I700 Atherosclerosis of aorta: Secondary | ICD-10-CM | POA: Diagnosis not present

## 2023-10-24 DIAGNOSIS — I708 Atherosclerosis of other arteries: Secondary | ICD-10-CM | POA: Diagnosis not present

## 2023-10-24 DIAGNOSIS — R55 Syncope and collapse: Secondary | ICD-10-CM | POA: Diagnosis not present

## 2023-10-24 DIAGNOSIS — I25119 Atherosclerotic heart disease of native coronary artery with unspecified angina pectoris: Secondary | ICD-10-CM | POA: Diagnosis not present

## 2023-10-24 DIAGNOSIS — I272 Pulmonary hypertension, unspecified: Secondary | ICD-10-CM | POA: Diagnosis not present

## 2023-11-18 ENCOUNTER — Ambulatory Visit: Payer: PPO

## 2023-11-18 VITALS — BP 142/76 | HR 73 | Ht 63.0 in | Wt 141.0 lb

## 2023-11-18 DIAGNOSIS — Z122 Encounter for screening for malignant neoplasm of respiratory organs: Secondary | ICD-10-CM

## 2023-11-18 DIAGNOSIS — Z0000 Encounter for general adult medical examination without abnormal findings: Secondary | ICD-10-CM | POA: Diagnosis not present

## 2023-11-18 NOTE — Progress Notes (Signed)
 Subjective:   Kristen Munoz is a 78 y.o. who presents for a Medicare Wellness preventive visit.  As a reminder, Annual Wellness Visits don't include a physical exam, and some assessments may be limited, especially if this visit is performed virtually. We may recommend an in-person follow-up visit with your provider if needed.  Visit Complete: Virtual I connected with  Kristen Munoz on 11/18/23 by a audio enabled telemedicine application and verified that I am speaking with the correct person using two identifiers.  Patient Location: Home  Provider Location: Home Office  I discussed the limitations of evaluation and management by telemedicine. The patient expressed understanding and agreed to proceed.  Vital Signs: Because this visit was a virtual/telehealth visit, some criteria may be missing or patient reported. Any vitals not documented were not able to be obtained and vitals that have been documented are patient reported.  VideoDeclined- This patient declined Librarian, academic. Therefore the visit was completed with audio only.  Persons Participating in Visit: Patient.  AWV Questionnaire: No: Patient Medicare AWV questionnaire was not completed prior to this visit.  Cardiac Risk Factors include: advanced age (>19men, >71 women);dyslipidemia;hypertension;sedentary lifestyle;smoking/ tobacco exposure     Objective:     Today's Vitals   11/18/23 1516  BP: (!) 142/76  Pulse: 73  Weight: 141 lb (64 kg)  Height: 5\' 3"  (1.6 m)  PainSc: 3    Body mass index is 24.98 kg/m.     11/18/2023    3:32 PM 11/15/2022    3:29 PM 02/27/2022    3:29 PM 02/13/2021    3:36 PM 01/06/2020   11:03 AM 12/04/2018    3:24 PM 06/27/2017    5:58 PM  Advanced Directives  Does Patient Have a Medical Advance Directive? No No No No No No No  Does patient want to make changes to medical advance directive?     No - Patient declined    Would patient like information on creating  a medical advance directive?  No - Patient declined No - Patient declined No - Patient declined  No - Patient declined     Current Medications (verified) Outpatient Encounter Medications as of 11/18/2023  Medication Sig   aspirin EC 81 MG tablet Take 81 mg by mouth daily.   citalopram  (CELEXA ) 40 MG tablet Take 1 tablet (40 mg total) by mouth daily.   ferrous sulfate  325 (65 FE) MG tablet Take 1 tablet (325 mg total) by mouth daily with breakfast. (NEEDS TO BE SEEN BEFORE NEXT REFILL)   levothyroxine  (EUTHYROX ) 25 MCG tablet TAKE 1 TABLET BY MOUTH ONCE DAILY BEFORE BREAKFAST   lisinopril -hydrochlorothiazide  (ZESTORETIC ) 10-12.5 MG tablet TAKE 1 TABLET BY MOUTH ONCE DAILY (NEEDS TO BE SEEN BEFORE NEXT REFILL)   metoprolol  tartrate (LOPRESSOR ) 25 MG tablet Take 1/2 (one-half) tablet by mouth twice daily   pantoprazole  (PROTONIX ) 40 MG tablet Take 1 tablet (40 mg total) by mouth daily. (NEEDS TO BE SEEN BEFORE NEXT REFILL)   rosuvastatin  (CRESTOR ) 10 MG tablet Take 1 tablet (10 mg total) by mouth daily.   azithromycin  (ZITHROMAX  Z-PAK) 250 MG tablet As directed (Patient not taking: Reported on 11/18/2023)   benzonatate  (TESSALON  PERLES) 100 MG capsule Take 1 capsule (100 mg total) by mouth 3 (three) times daily as needed for cough. (Patient not taking: Reported on 11/18/2023)   No facility-administered encounter medications on file as of 11/18/2023.    Allergies (verified) Sulfa antibiotics   History: Past Medical History:  Diagnosis Date   CAD (coronary artery disease)    GERD (gastroesophageal reflux disease)    Hyperlipidemia    Hypertension    Insomnia    Osteoporosis    Syncopal episodes    Thyroid  disease    Urinary bladder incontinence    Past Surgical History:  Procedure Laterality Date   ABDOMINAL HYSTERECTOMY     partial   bladder      baldder tack   THYROID  SURGERY     VESICOVAGINAL FISTULA CLOSURE W/ TAH     Family History  Problem Relation Age of Onset   Cancer  Mother        colon   Hypertension Mother    Diabetes Mother    Heart disease Mother    Cancer Father        lung   Diabetes Father    Hypertension Father    Heart disease Father    Cancer Sister        bone   Heart failure Sister    Birth defects Sister    Cancer Brother        kidney   Early death Brother        gun shot   Birth defects Brother    Heart failure Brother    Stevens-Johnson syndrome Sister    Anxiety disorder Sister    Depression Sister    Drug abuse Paternal Uncle    Thyroid  disease Daughter    Heart attack Daughter    Diabetes Daughter    Social History   Socioeconomic History   Marital status: Widowed    Spouse name: Not on file   Number of children: 2   Years of education: 11th   Highest education level: Not on file  Occupational History   Occupation: RETIRED   Tobacco Use   Smoking status: Every Day    Current packs/day: 1.00    Average packs/day: 1 pack/day for 49.4 years (49.4 ttl pk-yrs)    Types: Cigarettes    Start date: 07/02/1974   Smokeless tobacco: Never  Vaping Use   Vaping status: Never Used  Substance and Sexual Activity   Alcohol use: No   Drug use: No   Sexual activity: Not Currently  Other Topics Concern   Not on file  Social History Narrative   Lives alone,   Right-handed.   Caffeine use: 2 cups coffee daily, 2-3 cans Pepsi daily   One daughter passed away, she has 2 daughters that live nearby   Social Drivers of Health   Financial Resource Strain: Low Risk  (11/18/2023)   Overall Financial Resource Strain (CARDIA)    Difficulty of Paying Living Expenses: Not hard at all  Food Insecurity: No Food Insecurity (11/18/2023)   Hunger Vital Sign    Worried About Running Out of Food in the Last Year: Never true    Ran Out of Food in the Last Year: Never true  Transportation Needs: No Transportation Needs (11/18/2023)   PRAPARE - Administrator, Civil Service (Medical): No    Lack of Transportation  (Non-Medical): No  Physical Activity: Unknown (11/18/2023)   Exercise Vital Sign    Days of Exercise per Week: Patient declined    Minutes of Exercise per Session: Not on file  Stress: No Stress Concern Present (11/18/2023)   [employer] of Occupational Health - Occupational Stress Questionnaire    Feeling of Stress : Not at all  Social Connections: Moderately Isolated (11/18/2023)   Social  Connection and Isolation Panel [NHANES]    Frequency of Communication with Friends and Family: More than three times a week    Frequency of Social Gatherings with Friends and Family: More than three times a week    Attends Religious Services: More than 4 times per year    Active Member of [employer] or Organizations: No    Attends Banker Meetings: Never    Marital Status: Widowed    Tobacco Counseling Ready to quit: Not Answered Counseling given: Yes    Clinical Intake:  Pre-visit preparation completed: Yes  Pain : 0-10 (pt fell yesterday, just bruises around arms/hand/and hip) Pain Score: 3  Pain Type: Other (Comment) (pt fell yesterday, just bruises around arms/hand/and hip) Pain Location:  (pt fell yesterday, just bruises around arms/hand/and hip) Pain Descriptors / Indicators:  (pt fell yesterday, just bruises around arms/hand/and hip) Pain Onset: Yesterday Pain Frequency: Intermittent Pain Relieving Factors: tylenol Effect of Pain on Daily Activities: no  Pain Relieving Factors: tylenol  BMI - recorded: 24.98 Nutritional Status: BMI of 19-24  Normal Nutritional Risks: None Diabetes: No  No results found for: "HGBA1C"   How often do you need to have someone help you when you read instructions, pamphlets, or other written materials from your doctor or pharmacy?: 1 - Never  Interpreter Needed?: No  Information entered by :: Alia T/cma   Activities of Daily Living     11/18/2023    3:26 PM  In your present state of health, do you have any difficulty performing  the following activities:  Hearing? 0  Vision? 0  Difficulty concentrating or making decisions? 0  Walking or climbing stairs? 1  Dressing or bathing? 0  Doing errands, shopping? 0  Preparing Food and eating ? N  Using the Toilet? N  In the past six months, have you accidently leaked urine? Y  Do you have problems with loss of bowel control? Y  Managing your Medications? N  Managing your Finances? N  Housekeeping or managing your Housekeeping? N    Patient Care Team: Delfina Feller, FNP as PCP - General (Nurse Practitioner) Hyland Mailman, MD as Referring Physician (Optometry)  Indicate any recent Medical Services you may have received from other than [facility] providers in the past year (date may be approximate).     Assessment:    This is a routine wellness examination for Kristen Munoz.  Hearing/Vision screen Hearing Screening - Comments:: Pt denies hearing dif Vision Screening - Comments:: Pt denies vision dif   Goals Addressed             This Visit's Progress    DIET - INCREASE WATER INTAKE   On track    Pt is now drinking water, until last winter she would not drink water.     Patient Stated   On track    02/27/2022 AWV Goal: Exercise for General Health  Patient will verbalize understanding of the benefits of increased physical activity: Exercising regularly is important. It will improve your overall fitness, flexibility, and endurance. Regular exercise also will improve your overall health. It can help you control your weight, reduce stress, and improve your bone density. Over the next year, patient will increase physical activity as tolerated with a goal of at least 150 minutes of moderate physical activity per week.  You can tell that you are exercising at a moderate intensity if your heart starts beating faster and you start breathing faster but can still hold a conversation. Moderate-intensity  exercise ideas include: Walking 1 mile (1.6 km) in about 15  minutes Biking Hiking Golfing Dancing Water aerobics Patient will verbalize understanding of everyday activities that increase physical activity by providing examples like the following: Yard work, such as: Insurance underwriter Gardening Washing windows or floors Patient will be able to explain general safety guidelines for exercising:  Before you start a new exercise program, talk with your health care provider. Do not exercise so much that you hurt yourself, feel dizzy, or get very short of breath. Wear comfortable clothes and wear shoes with good support. Drink plenty of water while you exercise to prevent dehydration or heat stroke. Work out until your breathing and your heartbeat get faster.        Depression Screen     11/18/2023    3:35 PM 06/21/2023    2:53 PM 12/21/2022    2:42 PM 11/23/2022    9:54 AM 11/15/2022    3:28 PM 05/31/2022    2:11 PM 02/27/2022    3:40 PM  PHQ 2/9 Scores  PHQ - 2 Score 4 4 2 2  0 1 0  PHQ- 9 Score 8 18 10 9  3      Fall Risk     11/18/2023    3:25 PM 06/21/2023    2:53 PM 12/21/2022    2:42 PM 11/23/2022    9:54 AM 11/15/2022    3:27 PM  Fall Risk   Falls in the past year? 1 1 1 1  0  Number falls in past yr: 0 1 1 0 0  Injury with Fall? 0 0 1 1 0  Risk for fall due to : Impaired balance/gait;Impaired mobility History of fall(s) History of fall(s) Mental status change No Fall Risks  Follow up  Education provided Education provided Falls evaluation completed Falls prevention discussed    MEDICARE RISK AT HOME:  Medicare Risk at Home Any stairs in or around the home?: Yes If so, are there any without handrails?: Yes Home free of loose throw rugs in walkways, pet beds, electrical cords, etc?: Yes Adequate lighting in your home to reduce risk of falls?: Yes Life alert?: No Use of a cane, walker or w/c?: No Grab bars in the bathroom?: No Shower chair or  bench in shower?: No Elevated toilet seat or a handicapped toilet?: No  TIMED UP AND GO:  Was the test performed?  no  Cognitive Function: 6CIT completed    10/10/2016    4:09 PM 10/10/2016    4:02 PM 10/07/2015    4:01 PM 09/17/2014    4:35 PM  MMSE - Mini Mental State Exam  Orientation to time 5 5 5 5   Orientation to Place 5 5 5 5   Registration 3  3 3   Attention/ Calculation 4 4 3 5   Recall 2  3 3   Language- name 2 objects 2 2 2 2   Language- repeat 1 1 1 1   Language- follow 3 step command 3 3 3 3   Language- read & follow direction 1 1 1 1   Write a sentence 1 1 1 1   Copy design 0 0 1 1  Total score 27  28 30         11/18/2023    3:40 PM 11/15/2022    3:30 PM 02/27/2022    3:39 PM 02/13/2021    3:39 PM 01/06/2020   11:04 AM  6CIT Screen  What Year? 0  points 0 points 0 points 0 points 0 points  What month? 0 points 0 points 0 points 0 points 0 points  What time? 0 points 0 points 0 points 0 points 0 points  Count back from 20 0 points 0 points 0 points 0 points 0 points  Months in reverse 4 points 0 points 0 points 0 points 0 points  Repeat phrase 0 points 0 points 0 points 2 points 0 points  Total Score 4 points 0 points 0 points 2 points 0 points    Immunizations Immunization History  Administered Date(s) Administered   Fluad Quad(high Dose 65+) 06/02/2019, 05/29/2021, 05/31/2022   Influenza, High Dose Seasonal PF 04/18/2016, 05/20/2017, 05/09/2018   Influenza,inj,Quad PF,6+ Mos 04/22/2013, 06/05/2014, 03/31/2015   Moderna Sars-Covid-2 Vaccination 11/04/2019, 12/04/2019   Pneumococcal Conjugate-13 10/11/2014   Pneumococcal Polysaccharide-23 05/03/2011   Tdap 05/31/2022   Zoster, Live 09/17/2014    Screening Tests Health Maintenance  Topic Date Due   Zoster Vaccines- Shingrix (1 of 2) 12/28/1964   MAMMOGRAM  05/07/2017   Lung Cancer Screening  11/14/2017   COVID-19 Vaccine (3 - Moderna risk series) 12/03/2024 (Originally 01/01/2020)   INFLUENZA VACCINE  01/31/2024    Medicare Annual Wellness (AWV)  11/17/2024   DEXA SCAN  12/20/2024   DTaP/Tdap/Td (2 - Td or Tdap) 05/31/2032   Pneumonia Vaccine 3+ Years old  Completed   Hepatitis C Screening  Completed   HPV VACCINES  Aged Out   Meningococcal B Vaccine  Aged Out   Colonoscopy  Discontinued    Health Maintenance  Health Maintenance Due  Topic Date Due   Zoster Vaccines- Shingrix (1 of 2) 12[birth year]28/1964   MAMMOGRAM  05/07/2017   Lung Cancer Screening  11/14/2017   Health Maintenance Items Addressed: See Nurse Notes  Additional Screening:  Vision Screening: Recommended annual ophthalmology exams for early detection of glaucoma and other disorders of the eye.  Dental Screening: Recommended annual dental exams for proper oral hygiene  Community Resource Referral / Chronic Care Management: CRR required this visit?  No   CCM required this visit?  No   Plan:    I have personally reviewed and noted the following in the patient's chart:   Medical and social history Use of alcohol, tobacco or illicit drugs  Current medications and supplements including opioid prescriptions. Patient is not currently taking opioid prescriptions. Functional ability and status Nutritional status Physical activity Advanced directives List of other physicians Hospitalizations, surgeries, and ER visits in previous 12 months Vitals Screenings to include cognitive, depression, and falls Referrals and appointments  In addition, I have reviewed and discussed with patient certain preventive protocols, quality metrics, and best practice recommendations. A written personalized care plan for preventive services as well as general preventive health recommendations were provided to patient.   Michaelle Adolphus, CMA   11/18/2023   After Visit Summary: (MyChart) Due to this being a telephonic visit, the after visit summary with patients personalized plan was offered to patient via MyChart   Notes:  FYI PCP: Please remember  to schedule for your lung cancer screening and get your Shingles vaccine done. Pt is aware she is due for mammogram, per pt does not want it. Also, see routing msg.

## 2023-11-18 NOTE — Patient Instructions (Signed)
 Kristen Munoz , Thank you for taking time out of your busy schedule to complete your Annual Wellness Visit with me. I enjoyed our conversation and look forward to speaking with you again next year. I, as well as your care team,  appreciate your ongoing commitment to your health goals. Please review the following plan we discussed and let me know if I can assist you in the future. Your Game plan/ To Do List     Follow up Visits: Next Medicare AWV with our clinical staff: Wed., 11/18/24 at 1:50p.m. Next Office Visit with your provider: 12/20/23 at 2:15p.m.  Clinician Recommendations:  Aim for 30 minutes of exercise or brisk walking, 6-8 glasses of water, and 5 servings of fruits and vegetables each day. Please remember to schedule for your lung cancer screening and get your Shingles vaccine done. If you have any questions please contact our office at [phone].      This is a list of the screening recommended for you and due dates:  Health Maintenance  Topic Date Due   Zoster (Shingles) Vaccine (1 of 2) 12[birth year]28/1964   Mammogram  05/07/2017   Screening for Lung Cancer  11/14/2017   COVID-19 Vaccine (3 - Moderna risk series) 12/03/2024*   Flu Shot  01/31/2024   Medicare Annual Wellness Visit  11/17/2024   DEXA scan (bone density measurement)  12/20/2024   DTaP/Tdap/Td vaccine (2 - Td or Tdap) 05/31/2032   Pneumonia Vaccine  Completed   Hepatitis C Screening  Completed   HPV Vaccine  Aged Out   Meningitis B Vaccine  Aged Out   Colon Cancer Screening  Discontinued  *Topic was postponed. The date shown is not the original due date.    Advanced directives: (Declined) Advance directive discussed with you today. Even though you declined this today, please call our office should you change your mind, and we can give you the proper paperwork for you to fill out. Advance Care Planning is important because it:  [x]  Makes sure you receive the medical care that is consistent with your values, goals, and  preferences  [x]  It provides guidance to your family and loved ones and reduces their decisional burden about whether or not they are making the right decisions based on your wishes.  Follow the link provided in your after visit summary or read over the paperwork we have mailed to you to help you started getting your Advance Directives in place. If you need assistance in completing these, please reach out to us  so that we can help you!  See attachments for Preventive Care and Fall Prevention Tips.

## 2023-12-11 ENCOUNTER — Telehealth: Payer: Self-pay

## 2023-12-11 DIAGNOSIS — M810 Age-related osteoporosis without current pathological fracture: Secondary | ICD-10-CM

## 2023-12-11 MED ORDER — DENOSUMAB 60 MG/ML ~~LOC~~ SOSY
60.0000 mg | PREFILLED_SYRINGE | Freq: Once | SUBCUTANEOUS | Status: AC
  Administered 2024-02-04: 60 mg via SUBCUTANEOUS

## 2023-12-11 NOTE — Telephone Encounter (Signed)
 [employer]  sent foe benefit verification. Injection due 02/05/2024

## 2023-12-20 ENCOUNTER — Ambulatory Visit: Payer: PPO | Admitting: Nurse Practitioner

## 2023-12-20 NOTE — Progress Notes (Deleted)
 Subjective:    Patient ID: Kristen Munoz, female    DOB: April 13, 1946, 78 y.o.   MRN: [id number]   Chief Complaint: medical management of chronic issues     HPI:  Kristen Munoz is a 78 y.o. who identifies as a female who was assigned female at birth.   Social history: Lives with: by herself- her granddaughter checks on her daily Work history: retired   Water engineer in today for follow up of the following chronic medical issues:  1. Essential hypertension, benign No c/o chest  pain, sob or headache . Does not check blood pressure at  home. BP Readings from Last 3 Encounters:  11/18/23 (!) 142/76  06/21/23 (!) 142/76  12/21/22 127/76    2. Mixed hyperlipidemia Does try to watch diet and stay active. But does no dedicated exercise. Lab Results  Component Value Date   CHOL 160 12/21/2022   HDL 50 12/21/2022   LDLCALC 81 12/21/2022   TRIG 171 (H) 12/21/2022   CHOLHDL 3.2 12/21/2022     3. Coronary artery disease involving native coronary artery of native heart without angina pectoris Has not seen cardiology in over 3 years.  4. Gastroesophageal reflux disease, unspecified whether esophagitis present Is on protonix  and is doing well.  5. Acquired hypothyroidism No issues that she is aware of Lab Results  Component Value Date   TSH 0.593 06/21/2023     6. Recurrent major depressive disorder, in full remission (HCC) Is on celexa  and is doing well.    11/18/2023    3:35 PM 06/21/2023    2:53 PM 12/21/2022    2:42 PM  Depression screen PHQ 2/9  Decreased Interest 3 2 1   Down, Depressed, Hopeless 1 2 1   PHQ - 2 Score 4 4 2   Altered sleeping 1 2 1   Tired, decreased energy 3 2 1   Change in appetite 0 2 1  Feeling bad or failure about yourself  0 2 1  Trouble concentrating 0 2 2  Moving slowly or fidgety/restless 0 2 1  Suicidal thoughts 0 2 1  PHQ-9 Score 8 18 10   Difficult doing work/chores Not difficult at all Somewhat difficult Very difficult     7. GAD  (generalized anxiety disorder) Celexa  helps. Worries aboyt dying all the time.    06/21/2023    2:53 PM 12/21/2022    2:43 PM 11/23/2022    9:55 AM 05/31/2022    2:11 PM  GAD 7 : Generalized Anxiety Score  Nervous, Anxious, on Edge 2 2 1 2   Control/stop worrying 2 2 1 2   Worry too much - different things 2 2 1 2   Trouble relaxing 2 1 1 2   Restless 2 2 1 2   Easily annoyed or irritable 2 2 1 2   Afraid - awful might happen 2 3 1 2   Total GAD 7 Score 14 14 7 14   Anxiety Difficulty Somewhat difficult Somewhat difficult Somewhat difficult Somewhat difficult      8. Primary insomnia She has been sleeping well as of late  9. Stress incontinence, female Wears depends-  10. Age-related osteoporosis without current pathological fracture No weight bearing exercise. Last dexascan was done on 06/29/20. T score was -1.9.   New complaints: None today  Allergies  Allergen Reactions   Sulfa Antibiotics Nausea Only   Outpatient Encounter Medications as of 12/20/2023  Medication Sig   aspirin EC 81 MG tablet Take 81 mg by mouth daily.   azithromycin  (ZITHROMAX  Z-PAK) 250 MG  tablet As directed (Patient not taking: Reported on 11/18/2023)   benzonatate  (TESSALON  PERLES) 100 MG capsule Take 1 capsule (100 mg total) by mouth 3 (three) times daily as needed for cough. (Patient not taking: Reported on 11/18/2023)   citalopram  (CELEXA ) 40 MG tablet Take 1 tablet (40 mg total) by mouth daily.   ferrous sulfate  325 (65 FE) MG tablet Take 1 tablet (325 mg total) by mouth daily with breakfast. (NEEDS TO BE SEEN BEFORE NEXT REFILL)   levothyroxine  (EUTHYROX ) 25 MCG tablet TAKE 1 TABLET BY MOUTH ONCE DAILY BEFORE BREAKFAST   lisinopril -hydrochlorothiazide  (ZESTORETIC ) 10-12.5 MG tablet TAKE 1 TABLET BY MOUTH ONCE DAILY (NEEDS TO BE SEEN BEFORE NEXT REFILL)   metoprolol  tartrate (LOPRESSOR ) 25 MG tablet Take 1/2 (one-half) tablet by mouth twice daily   pantoprazole  (PROTONIX ) 40 MG tablet Take 1 tablet (40  mg total) by mouth daily. (NEEDS TO BE SEEN BEFORE NEXT REFILL)   rosuvastatin  (CRESTOR ) 10 MG tablet Take 1 tablet (10 mg total) by mouth daily.   Facility-Administered Encounter Medications as of 12/20/2023  Medication   [START ON 02/05/2024] denosumab  (PROLIA ) injection 60 mg    Past Surgical History:  Procedure Laterality Date   ABDOMINAL HYSTERECTOMY     partial   bladder      baldder tack   THYROID  SURGERY     VESICOVAGINAL FISTULA CLOSURE W/ TAH      Family History  Problem Relation Age of Onset   Cancer Mother        colon   Hypertension Mother    Diabetes Mother    Heart disease Mother    Cancer Father        lung   Diabetes Father    Hypertension Father    Heart disease Father    Cancer Sister        bone   Heart failure Sister    Birth defects Sister    Cancer Brother        kidney   Early death Brother        gun shot   Birth defects Brother    Heart failure Brother    Stevens-Johnson syndrome Sister    Anxiety disorder Sister    Depression Sister    Drug abuse Paternal Uncle    Thyroid  disease Daughter    Heart attack Daughter    Diabetes Daughter       Controlled substance contract: n/a     Review of Systems  Constitutional:  Negative for diaphoresis.  Eyes:  Negative for pain.  Respiratory:  Negative for shortness of breath.   Cardiovascular:  Negative for chest pain, palpitations and leg swelling.  Gastrointestinal:  Negative for abdominal pain.  Endocrine: Negative for polydipsia.  Skin:  Negative for rash.  Neurological:  Negative for dizziness, weakness and headaches.  Hematological:  Does not bruise/bleed easily.  All other systems reviewed and are negative.      Objective:   Physical Exam Vitals and nursing note reviewed.  Constitutional:      General: She is not in acute distress.    Appearance: Normal appearance. She is well-developed.  HENT:     Head: Normocephalic.     Right Ear: Tympanic membrane normal.     Left  Ear: Tympanic membrane normal.     Nose: Nose normal.     Mouth/Throat:     Mouth: Mucous membranes are moist.   Eyes:     Pupils: Pupils are equal, round, and reactive to  light.   Neck:     Vascular: No carotid bruit or JVD.   Cardiovascular:     Rate and Rhythm: Normal rate and regular rhythm.     Heart sounds: Normal heart sounds.  Pulmonary:     Effort: Pulmonary effort is normal. No respiratory distress.     Breath sounds: Normal breath sounds. No wheezing or rales.  Chest:     Chest wall: No tenderness.  Abdominal:     General: Bowel sounds are normal. There is no distension or abdominal bruit.     Palpations: Abdomen is soft. There is no hepatomegaly, splenomegaly, mass or pulsatile mass.     Tenderness: There is no abdominal tenderness.   Musculoskeletal:        General: Normal range of motion.     Cervical back: Normal range of motion and neck supple.  Lymphadenopathy:     Cervical: No cervical adenopathy.   Skin:    General: Skin is warm and dry.     Comments: Small hematoma left lateral eye orbit.   Neurological:     Mental Status: She is alert and oriented to person, place, and time.     Cranial Nerves: No cranial nerve deficit.     Sensory: No sensory deficit.     Deep Tendon Reflexes: Reflexes are normal and symmetric.   Psychiatric:        Behavior: Behavior normal.        Thought Content: Thought content normal.        Judgment: Judgment normal.    There were no vitals taken for this visit.          Assessment & Plan:   Kristen Munoz comes in today with chief complaint of No chief complaint on file.   Diagnosis and orders addressed:  1. Essential hypertension, benign Low sodium diet - lisinopril -hydrochlorothiazide  (ZESTORETIC ) 10-12.5 MG tablet; TAKE 1 TABLET BY MOUTH ONCE DAILY (NEEDS TO BE SEEN BEFORE NEXT REFILL)  Dispense: 90 tablet; Refill: 1 - metoprolol  tartrate (LOPRESSOR ) 25 MG tablet; Take 1/2 (one-half) tablet by mouth twice  daily  Dispense: 90 tablet; Refill: 1  2. Mixed hyperlipidemia Low fat diet - rosuvastatin  (CRESTOR ) 10 MG tablet; Take 1 tablet (10 mg total) by mouth daily.  Dispense: 90 tablet; Refill: 1  3. Coronary artery disease involving native coronary artery of native heart without angina pectoris Avoid spicy foods Do not eat 2 hours prior to bedtime 4. Gastroesophageal reflux disease, unspecified whether esophagitis present Avoid spicy foods Do not eat 2 hours prior to bedtime  - pantoprazole  (PROTONIX ) 40 MG tablet; Take 1 tablet (40 mg total) by mouth daily. (NEEDS TO BE SEEN BEFORE NEXT REFILL)  Dispense: 90 tablet; Refill: 1  5. Acquired hypothyroidism Labs pending - levothyroxine  (EUTHYROX ) 25 MCG tablet; TAKE 1 TABLET BY MOUTH ONCE DAILY BEFORE BREAKFAST  Dispense: 90 tablet; Refill: 1  6. Recurrent major depressive disorder, in full remission (HCC) Stress management - citalopram  (CELEXA ) 40 MG tablet; Take 1 tablet (40 mg total) by mouth daily.  Dispense: 30 tablet; Refill: 5  7. GAD (generalized anxiety disorder) - citalopram  (CELEXA ) 40 MG tablet; Take 1 tablet (40 mg total) by mouth daily.  Dispense: 30 tablet; Refill: 5  8. Primary insomnia Bedtime routine  9. Stress incontinence, female  10. Age-related osteoporosis without current pathological fracture Weight bearing exercise  11. Iron deficiency anemia, unspecified iron deficiency anemia type Continue iro supplement - ferrous sulfate  325 (65 FE) MG tablet; Take  1 tablet (325 mg total) by mouth daily with breakfast. (NEEDS TO BE SEEN BEFORE NEXT REFILL)  Dispense: 90 tablet; Refill: 1  12. Uri with cough and congestion 1. Take meds as prescribed 2. Use a cool mist humidifier especially during the winter months and when heat has been humid. 3. Use saline nose sprays frequently 4. Saline irrigations of the nose can be very helpful if done frequently.  * 4X daily for 1 week*  * Use of a nettie pot can be helpful with  this. Follow directions with this* 5. Drink plenty of fluids 6. Keep thermostat turn down low 7.For any cough or congestion- tessalon  perles 8. For fever or aces or pains- take tylenol or ibuprofen appropriate for age and weight.  * for fevers greater than 101 orally you may alternate ibuprofen and tylenol every  3 hours. Z pak and tessalon  perles    Labs pending Health Maintenance reviewed Diet and exercise encouraged  Follow up plan: 6 months   Mary-Margaret Gaylyn Keas, FNP

## 2023-12-24 ENCOUNTER — Encounter: Payer: Self-pay | Admitting: Nurse Practitioner

## 2024-01-06 ENCOUNTER — Telehealth: Payer: Self-pay

## 2024-01-06 NOTE — Telephone Encounter (Signed)
 Prolia  VOB initiated via [email or web address]  Next Prolia  inj DUE: 02/04/24

## 2024-01-07 ENCOUNTER — Other Ambulatory Visit (HOSPITAL_COMMUNITY): Payer: Self-pay

## 2024-01-07 NOTE — Telephone Encounter (Signed)
 Pt ready for scheduling for PROLIA  on or after : 02/04/24  Option# 1: Buy/Bill (Office supplied medication)  Out-of-pocket cost due at time of clinic visit: $332  Number of injection/visits approved: ---  Primary: [employer] Prolia  co-insurance: 20% Admin fee co-insurance: 0%  Secondary: --- Prolia  co-insurance:  Admin fee co-insurance:   Medical Benefit Details: Date Benefits were checked: 01/07/24 Deductible: NO/ Coinsurance: 20%/ Admin Fee: 0%  Prior Auth: N/A PA# Expiration Date:   # of doses approved: ----------------------------------------------------------------------- Option# 2- Med Obtained from pharmacy:  Pharmacy benefit: Copay $250 (Paid to pharmacy) Admin Fee: 0% (Pay at clinic)  Prior Auth: N/A PA# Expiration Date:   # of doses approved:   If patient wants fill through the pharmacy benefit please send prescription to: [employer], and include estimated need by date in rx notes. Pharmacy will ship medication directly to the office.  Patient NOT eligible for Prolia  Copay Card. Copay Card can make patient's cost as little as $25. Link to apply: [email or web address]://[email or web address]  ** This summary of benefits is an estimation of the patient's out-of-pocket cost. Exact cost may very based on individual plan coverage.

## 2024-01-31 NOTE — Telephone Encounter (Signed)
 Spoke to patient she prefers bill and buy - appt made for 02/04/2024

## 2024-02-04 ENCOUNTER — Ambulatory Visit (INDEPENDENT_AMBULATORY_CARE_PROVIDER_SITE_OTHER)

## 2024-02-04 DIAGNOSIS — M810 Age-related osteoporosis without current pathological fracture: Secondary | ICD-10-CM

## 2024-02-04 NOTE — Progress Notes (Signed)
 Patient is in office today for a nurse visit for  Prolia . Patient Injection was given in the  Left arm. Patient tolerated injection well.

## 2024-02-12 ENCOUNTER — Other Ambulatory Visit: Payer: Self-pay | Admitting: Pharmacist

## 2024-02-12 ENCOUNTER — Telehealth: Payer: Self-pay | Admitting: Pharmacist

## 2024-02-12 DIAGNOSIS — I10 Essential (primary) hypertension: Secondary | ICD-10-CM

## 2024-02-12 DIAGNOSIS — E782 Mixed hyperlipidemia: Secondary | ICD-10-CM

## 2024-02-12 MED ORDER — ROSUVASTATIN CALCIUM 10 MG PO TABS: 10.0000 mg | ORAL_TABLET | Freq: Every day | ORAL | 1 refills | Status: DC

## 2024-02-12 MED ORDER — LISINOPRIL-HYDROCHLOROTHIAZIDE 10-12.5 MG PO TABS: ORAL_TABLET | ORAL | 0 refills | Status: DC

## 2024-02-12 NOTE — Telephone Encounter (Signed)
 03/06/2024 at 2:15

## 2024-02-12 NOTE — Progress Notes (Unsigned)
   This patient is appearing on a report for being at risk of failing the adherence measure for cholesterol (statin) and hypertension (ACEi/ARB) medications this calendar year.   Medication:  Rosuvastatin  10mg   Lisinopril  10-12.5mg   Last fill date: 09/18/23 for 90 day supply  Contacted pharmacy to facilitate refills.   Leena Tiede Dattero Marlissa Emerick, PharmD, BCACP, CPP Clinical Pharmacist, [facility]

## 2024-02-12 NOTE — Telephone Encounter (Signed)
 Patient needs follow up with PCP No show in June Please reach out to schedule chronic follow up

## 2024-03-06 ENCOUNTER — Encounter: Payer: Self-pay | Admitting: Nurse Practitioner

## 2024-03-06 ENCOUNTER — Ambulatory Visit: Admitting: Nurse Practitioner

## 2024-03-06 VITALS — BP 139/76 | HR 75 | Temp 96.8°F | Ht 63.0 in | Wt 146.0 lb

## 2024-03-06 DIAGNOSIS — E782 Mixed hyperlipidemia: Secondary | ICD-10-CM

## 2024-03-06 DIAGNOSIS — D509 Iron deficiency anemia, unspecified: Secondary | ICD-10-CM

## 2024-03-06 DIAGNOSIS — E039 Hypothyroidism, unspecified: Secondary | ICD-10-CM | POA: Diagnosis not present

## 2024-03-06 DIAGNOSIS — D229 Melanocytic nevi, unspecified: Secondary | ICD-10-CM

## 2024-03-06 DIAGNOSIS — I10 Essential (primary) hypertension: Secondary | ICD-10-CM | POA: Diagnosis not present

## 2024-03-06 DIAGNOSIS — F17200 Nicotine dependence, unspecified, uncomplicated: Secondary | ICD-10-CM | POA: Diagnosis not present

## 2024-03-06 DIAGNOSIS — F3342 Major depressive disorder, recurrent, in full remission: Secondary | ICD-10-CM | POA: Diagnosis not present

## 2024-03-06 DIAGNOSIS — M810 Age-related osteoporosis without current pathological fracture: Secondary | ICD-10-CM

## 2024-03-06 DIAGNOSIS — F411 Generalized anxiety disorder: Secondary | ICD-10-CM

## 2024-03-06 DIAGNOSIS — K219 Gastro-esophageal reflux disease without esophagitis: Secondary | ICD-10-CM | POA: Diagnosis not present

## 2024-03-06 DIAGNOSIS — I2510 Atherosclerotic heart disease of native coronary artery without angina pectoris: Secondary | ICD-10-CM | POA: Diagnosis not present

## 2024-03-06 DIAGNOSIS — F5101 Primary insomnia: Secondary | ICD-10-CM | POA: Diagnosis not present

## 2024-03-06 LAB — LIPID PANEL

## 2024-03-06 MED ORDER — FERROUS SULFATE 325 (65 FE) MG PO TABS: 325.0000 mg | ORAL_TABLET | Freq: Every day | ORAL | 1 refills | Status: AC

## 2024-03-06 MED ORDER — ROSUVASTATIN CALCIUM 10 MG PO TABS: 10.0000 mg | ORAL_TABLET | Freq: Every day | ORAL | 1 refills | Status: DC

## 2024-03-06 MED ORDER — METOPROLOL TARTRATE 25 MG PO TABS
ORAL_TABLET | ORAL | 1 refills | Status: AC
Start: 2024-03-06 — End: ?

## 2024-03-06 MED ORDER — LEVOTHYROXINE SODIUM 25 MCG PO TABS: ORAL_TABLET | ORAL | 1 refills | Status: AC

## 2024-03-06 MED ORDER — CITALOPRAM HYDROBROMIDE 40 MG PO TABS
40.0000 mg | ORAL_TABLET | Freq: Every day | ORAL | 1 refills | Status: AC
Start: 2024-03-06 — End: ?

## 2024-03-06 MED ORDER — PANTOPRAZOLE SODIUM 40 MG PO TBEC: 40.0000 mg | DELAYED_RELEASE_TABLET | Freq: Every day | ORAL | 1 refills | Status: AC

## 2024-03-06 MED ORDER — LISINOPRIL-HYDROCHLOROTHIAZIDE 10-12.5 MG PO TABS: ORAL_TABLET | ORAL | 1 refills | Status: DC

## 2024-03-06 NOTE — Patient Instructions (Signed)

## 2024-03-06 NOTE — Progress Notes (Addendum)
 Subjective:    Patient ID: Kristen Munoz, female    DOB: 1945-08-24, 78 y.o.   MRN: [id number]   Chief Complaint: medical management of chronic issues     HPI:  Kristen Munoz is a 78 y.o. who identifies as a female who was assigned female at birth.   Social history: Lives with: by herself- her granddaughter checks on her daily Work history: retired   Water engineer in today for follow up of the following chronic medical issues:  1. Essential hypertension, benign No c/o chest  pain, sob or headache . Does not check blood pressure at  home. BP Readings from Last 3 Encounters:  03/06/24 139/76  11/18/23 (!) 142/76  06/21/23 (!) 142/76    2. Mixed hyperlipidemia Does try to watch diet and stay active. But does no dedicated exercise. Lab Results  Component Value Date   CHOL 160 12/21/2022   HDL 50 12/21/2022   LDLCALC 81 12/21/2022   TRIG 171 (H) 12/21/2022   CHOLHDL 3.2 12/21/2022     3. Coronary artery disease involving native coronary artery of native heart without angina pectoris Has not seen cardiology in over 3 years.  4. Gastroesophageal reflux disease, unspecified whether esophagitis present Is on protonix  and is doing well.  5. Acquired hypothyroidism No issues that she is aware of Lab Results  Component Value Date   TSH 0.593 06/21/2023     6. Recurrent major depressive disorder, in full remission (HCC) Says she needs something. She is not sure she is taking her celexa . According  to her chart she has not got filled since 10/06/23.    11/18/2023    3:35 PM 06/21/2023    2:53 PM 12/21/2022    2:42 PM  Depression screen PHQ 2/9  Decreased Interest 3 2 1   Down, Depressed, Hopeless 1 2 1   PHQ - 2 Score 4 4 2   Altered sleeping 1 2 1   Tired, decreased energy 3 2 1   Change in appetite 0 2 1  Feeling bad or failure about yourself  0 2 1  Trouble concentrating 0 2 2  Moving slowly or fidgety/restless 0 2 1  Suicidal thoughts 0 2 1  PHQ-9 Score 8 18 10   Difficult  doing work/chores Not difficult at all Somewhat difficult Very difficult     7. GAD (generalized anxiety disorder) Celexa  helps. Worries about dying all the time.    06/21/2023    2:53 PM 12/21/2022    2:43 PM 11/23/2022    9:55 AM 05/31/2022    2:11 PM  GAD 7 : Generalized Anxiety Score  Nervous, Anxious, on Edge 2 2 1 2   Control/stop worrying 2 2 1 2   Worry too much - different things 2 2 1 2   Trouble relaxing 2 1 1 2   Restless 2 2 1 2   Easily annoyed or irritable 2 2 1 2   Afraid - awful might happen 2 3 1 2   Total GAD 7 Score 14 14 7 14   Anxiety Difficulty Somewhat difficult Somewhat difficult Somewhat difficult Somewhat difficult      8. Primary insomnia She has been sleeping well as of late  9. Stress incontinence, female Wears depends-  10. Age-related osteoporosis without current pathological fracture No weight bearing exercise. Last dexascan was done on 12/21/22. T score was -1.9.  11. Smoker Smokes about 1 1/2 pack a day- last CT scan was in 2018.  New complaints: None today  Allergies  Allergen Reactions   Sulfa Antibiotics  Nausea Only   Outpatient Encounter Medications as of 03/06/2024  Medication Sig   aspirin EC 81 MG tablet Take 81 mg by mouth daily.   citalopram  (CELEXA ) 40 MG tablet Take 1 tablet (40 mg total) by mouth daily.   ferrous sulfate  325 (65 FE) MG tablet Take 1 tablet (325 mg total) by mouth daily with breakfast. (NEEDS TO BE SEEN BEFORE NEXT REFILL)   levothyroxine  (EUTHYROX ) 25 MCG tablet TAKE 1 TABLET BY MOUTH ONCE DAILY BEFORE BREAKFAST   lisinopril -hydrochlorothiazide  (ZESTORETIC ) 10-12.5 MG tablet TAKE 1 TABLET BY MOUTH ONCE DAILY   metoprolol  tartrate (LOPRESSOR ) 25 MG tablet Take 1/2 (one-half) tablet by mouth twice daily   pantoprazole  (PROTONIX ) 40 MG tablet Take 1 tablet (40 mg total) by mouth daily. (NEEDS TO BE SEEN BEFORE NEXT REFILL)   rosuvastatin  (CRESTOR ) 10 MG tablet Take 1 tablet (10 mg total) by mouth daily.    [DISCONTINUED] azithromycin  (ZITHROMAX  Z-PAK) 250 MG tablet As directed   [DISCONTINUED] benzonatate  (TESSALON  PERLES) 100 MG capsule Take 1 capsule (100 mg total) by mouth 3 (three) times daily as needed for cough.   No facility-administered encounter medications on file as of 03/06/2024.    Past Surgical History:  Procedure Laterality Date   ABDOMINAL HYSTERECTOMY     partial   bladder      baldder tack   THYROID  SURGERY     VESICOVAGINAL FISTULA CLOSURE W/ TAH      Family History  Problem Relation Age of Onset   Cancer Mother        colon   Hypertension Mother    Diabetes Mother    Heart disease Mother    Cancer Father        lung   Diabetes Father    Hypertension Father    Heart disease Father    Cancer Sister        bone   Heart failure Sister    Birth defects Sister    Cancer Brother        kidney   Early death Brother        gun shot   Birth defects Brother    Heart failure Brother    Stevens-Johnson syndrome Sister    Anxiety disorder Sister    Depression Sister    Drug abuse Paternal Uncle    Thyroid  disease Daughter    Heart attack Daughter    Diabetes Daughter       Controlled substance contract: n/a     Review of Systems  Constitutional:  Negative for diaphoresis.  Eyes:  Negative for pain.  Respiratory:  Negative for shortness of breath.   Cardiovascular:  Negative for chest pain, palpitations and leg swelling.  Gastrointestinal:  Negative for abdominal pain.  Endocrine: Negative for polydipsia.  Skin:  Negative for rash.  Neurological:  Negative for dizziness, weakness and headaches.  Hematological:  Does not bruise/bleed easily.  All other systems reviewed and are negative.      Objective:   Physical Exam Vitals and nursing note reviewed.  Constitutional:      General: She is not in acute distress.    Appearance: Normal appearance. She is well-developed.  HENT:     Head: Normocephalic.     Right Ear: Tympanic membrane normal.      Left Ear: Tympanic membrane normal.     Nose: Nose normal.     Mouth/Throat:     Mouth: Mucous membranes are moist.  Eyes:     Pupils: Pupils  are equal, round, and reactive to light.  Neck:     Vascular: No carotid bruit or JVD.  Cardiovascular:     Rate and Rhythm: Normal rate and regular rhythm.     Heart sounds: Normal heart sounds.  Pulmonary:     Effort: Pulmonary effort is normal. No respiratory distress.     Breath sounds: Normal breath sounds. No wheezing or rales.  Chest:     Chest wall: No tenderness.  Abdominal:     General: Bowel sounds are normal. There is no distension or abdominal bruit.     Palpations: Abdomen is soft. There is no hepatomegaly, splenomegaly, mass or pulsatile mass.     Tenderness: There is no abdominal tenderness.  Musculoskeletal:        General: Normal range of motion.     Cervical back: Normal range of motion and neck supple.  Lymphadenopathy:     Cervical: No cervical adenopathy.  Skin:    General: Skin is warm and dry.     Comments: 1cm raised black mole anterior chest wall  Neurological:     Mental Status: She is alert and oriented to person, place, and time.     Cranial Nerves: No cranial nerve deficit.     Sensory: No sensory deficit.     Deep Tendon Reflexes: Reflexes are normal and symmetric.  Psychiatric:        Behavior: Behavior normal.        Thought Content: Thought content normal.        Judgment: Judgment normal.    BP 139/76   Pulse 75   Temp (!) 96.8 F (36 C) (Temporal)   Ht 5' 3 (1.6 m)   Wt 146 lb (66.2 kg)   SpO2 96%   BMI 25.86 kg/m     Cryotherapy to mole    Assessment & Plan:   CARMELINE KOWAL comes in today with chief complaint of Medical Management of Chronic Issues   Diagnosis and orders addressed:  1. Essential hypertension, benign Low sodium diet - lisinopril -hydrochlorothiazide  (ZESTORETIC ) 10-12.5 MG tablet; TAKE 1 TABLET BY MOUTH ONCE DAILY (NEEDS TO BE SEEN BEFORE NEXT REFILL)  Dispense:  90 tablet; Refill: 1 - metoprolol  tartrate (LOPRESSOR ) 25 MG tablet; Take 1/2 (one-half) tablet by mouth twice daily  Dispense: 90 tablet; Refill: 1  2. Mixed hyperlipidemia Low fat diet - rosuvastatin  (CRESTOR ) 10 MG tablet; Take 1 tablet (10 mg total) by mouth daily.  Dispense: 90 tablet; Refill: 1  3. Coronary artery disease involving native coronary artery of native heart without angina pectoris Avoid spicy foods Do not eat 2 hours prior to bedtime  4. Gastroesophageal reflux disease, unspecified whether esophagitis present Avoid spicy foods Do not eat 2 hours prior to bedtime  - pantoprazole  (PROTONIX ) 40 MG tablet; Take 1 tablet (40 mg total) by mouth daily. (NEEDS TO BE SEEN BEFORE NEXT REFILL)  Dispense: 90 tablet; Refill: 1  5. Acquired hypothyroidism Labs pending - levothyroxine  (EUTHYROX ) 25 MCG tablet; TAKE 1 TABLET BY MOUTH ONCE DAILY BEFORE BREAKFAST  Dispense: 90 tablet; Refill: 1  6. Recurrent major depressive disorder, in full remission [facility]) Stress management Get back on celexa  - citalopram  (CELEXA ) 40 MG tablet; Take 1 tablet (40 mg total) by mouth daily.  Dispense: 30 tablet; Refill: 5  7. GAD (generalized anxiety disorder) - citalopram  (CELEXA ) 40 MG tablet; Take 1 tablet (40 mg total) by mouth daily.  Dispense: 30 tablet; Refill: 5  8. Primary insomnia Bedtime routine  9. Stress incontinence, female  10. Age-related osteoporosis without current pathological fracture Weight bearing exercise  11. Iron deficiency anemia, unspecified iron deficiency anemia type Continue iro supplement - ferrous sulfate  325 (65 FE) MG tablet; Take 1 tablet (325 mg total) by mouth daily with breakfast. (NEEDS TO BE SEEN BEFORE NEXT REFILL)  Dispense: 90 tablet; Refill: 1  12. smoker  Smoking cessation encouraged Low dose CT scan ordered   Labs pending Health Maintenance reviewed Diet and exercise encouraged  Follow up plan: 6 months   Mary-Margaret Gladis, FNP

## 2024-03-07 LAB — CBC WITH DIFFERENTIAL/PLATELET
Basophils Absolute: 0 x10E3/uL (ref 0.0–0.2)
Basos: 1 %
EOS (ABSOLUTE): 0 x10E3/uL (ref 0.0–0.4)
Eos: 1 %
Hematocrit: 33.6 % — ABNORMAL LOW (ref 34.0–46.6)
Hemoglobin: 11 g/dL — ABNORMAL LOW (ref 11.1–15.9)
Immature Grans (Abs): 0 x10E3/uL (ref 0.0–0.1)
Immature Granulocytes: 0 %
Lymphocytes Absolute: 3.1 x10E3/uL (ref 0.7–3.1)
Lymphs: 36 %
MCH: 31.3 pg (ref 26.6–33.0)
MCHC: 32.7 g/dL (ref 31.5–35.7)
MCV: 96 fL (ref 79–97)
Monocytes Absolute: 0.6 x10E3/uL (ref 0.1–0.9)
Monocytes: 7 %
Neutrophils Absolute: 4.9 x10E3/uL (ref 1.4–7.0)
Neutrophils: 55 %
Platelets: 189 x10E3/uL (ref 150–450)
RBC: 3.51 x10E6/uL — ABNORMAL LOW (ref 3.77–5.28)
RDW: 12.7 % (ref 11.7–15.4)
WBC: 8.7 x10E3/uL (ref 3.4–10.8)

## 2024-03-07 LAB — THYROID PANEL WITH TSH
Free Thyroxine Index: 2.3 (ref 1.2–4.9)
T3 Uptake Ratio: 27 % (ref 24–39)
T4, Total: 8.5 ug/dL (ref 4.5–12.0)
TSH: 0.765 u[IU]/mL (ref 0.450–4.500)

## 2024-03-07 LAB — CMP14+EGFR
ALT: 10 IU/L (ref 0–32)
AST: 15 IU/L (ref 0–40)
Albumin: 4.4 g/dL (ref 3.8–4.8)
Alkaline Phosphatase: 56 IU/L (ref 44–121)
BUN/Creatinine Ratio: 16 (ref 12–28)
BUN: 19 mg/dL (ref 8–27)
Bilirubin Total: 0.3 mg/dL (ref 0.0–1.2)
CO2: 20 mmol/L (ref 20–29)
Calcium: 8.7 mg/dL (ref 8.7–10.3)
Chloride: 103 mmol/L (ref 96–106)
Creatinine, Ser: 1.19 mg/dL — ABNORMAL HIGH (ref 0.57–1.00)
Globulin, Total: 2.4 g/dL (ref 1.5–4.5)
Glucose: 94 mg/dL (ref 70–99)
Potassium: 4.7 mmol/L (ref 3.5–5.2)
Sodium: 138 mmol/L (ref 134–144)
Total Protein: 6.8 g/dL (ref 6.0–8.5)
eGFR: 47 mL/min/1.73 — ABNORMAL LOW (ref >59)

## 2024-03-07 LAB — LIPID PANEL
Chol/HDL Ratio: 2.4 ratio (ref 0.0–4.4)
Cholesterol, Total: 135 mg/dL (ref 100–199)
HDL: 56 mg/dL (ref >39)
LDL Chol Calc (NIH): 60 mg/dL (ref 0–99)
Triglycerides: 104 mg/dL (ref 0–149)
VLDL Cholesterol Cal: 19 mg/dL (ref 5–40)

## 2024-03-09 ENCOUNTER — Ambulatory Visit: Payer: Self-pay | Admitting: Nurse Practitioner

## 2024-07-01 ENCOUNTER — Ambulatory Visit: Payer: Self-pay

## 2024-07-01 NOTE — Telephone Encounter (Signed)
 FYI Only or Action Required?: FYI only for provider: appointment scheduled on 07/03/24.  Patient was last seen in primary care on 03/06/2024 by Gladis Mustard, FNP.  Called Nurse Triage reporting Cough.  Symptoms began a week ago.  Interventions attempted: Nothing.  Symptoms are: gradually worsening.  Triage Disposition: See HCP Within 4 Hours (Or PCP Triage)  Patient/caregiver understands and will follow disposition?: Yes, but will wait   Copied from CRM #[id number]. Topic: Clinical - Red Word Triage >> Jul 01, 2024  4:07 PM Joesph NOVAK wrote: Red Word that prompted transfer to Nurse Triage:  Head is hurting, chest and back is in pain, chills, discolored mucous Reason for Disposition  [1] MILD difficulty breathing (e.g., minimal/no SOB at rest, SOB with walking, pulse < 100) AND [2] still present when not coughing  Answer Assessment - Initial Assessment Questions 1. ONSET: When did the cough begin?      A week ago 2. SEVERITY: How bad is the cough today?      mild 3. SPUTUM: Describe the color of your sputum (e.g., none, dry cough; clear, white, yellow, green)     yellow 4. HEMOPTYSIS: Are you coughing up any blood? If Yes, ask: How much? (e.g., flecks, streaks, tablespoons, etc.)     denies 5. DIFFICULTY BREATHING: Are you having difficulty breathing? If Yes, ask: How bad is it? (e.g., mild, moderate, severe)      Mild SOB with activity 6. FEVER: Do you have a fever? If Yes, ask: What is your temperature, how was it measured, and when did it start?     Hasn't checked, has chills 7. CARDIAC HISTORY: Do you have any history of heart disease? (e.g., heart attack, congestive heart failure)      no 8. LUNG HISTORY: Do you have any history of lung disease?  (e.g., pulmonary embolus, asthma, emphysema)     no 9. PE RISK FACTORS: Do you have a history of blood clots? (or: recent major surgery, recent prolonged travel, bedridden)     no 10. OTHER SYMPTOMS: Do  you have any other symptoms? (e.g., runny nose, wheezing, chest pain)       CP with cough only.  Protocols used: Cough - Acute Productive-A-AH

## 2024-07-03 ENCOUNTER — Ambulatory Visit: Admitting: Family Medicine

## 2024-07-03 ENCOUNTER — Encounter: Payer: Self-pay | Admitting: Family Medicine

## 2024-07-03 VITALS — BP 136/80 | HR 85 | Ht 63.0 in | Wt 144.0 lb

## 2024-07-03 DIAGNOSIS — J40 Bronchitis, not specified as acute or chronic: Secondary | ICD-10-CM

## 2024-07-03 DIAGNOSIS — J4521 Mild intermittent asthma with (acute) exacerbation: Secondary | ICD-10-CM | POA: Diagnosis not present

## 2024-07-03 MED ORDER — PREDNISONE 20 MG PO TABS: ORAL_TABLET | ORAL | 0 refills | Status: AC

## 2024-07-03 MED ORDER — ALBUTEROL SULFATE HFA 108 (90 BASE) MCG/ACT IN AERS: 2.0000 | INHALATION_SPRAY | Freq: Four times a day (QID) | RESPIRATORY_TRACT | 0 refills | Status: AC | PRN

## 2024-07-03 MED ORDER — AMOXICILLIN 500 MG PO CAPS: 500.0000 mg | ORAL_CAPSULE | Freq: Two times a day (BID) | ORAL | 0 refills | Status: AC

## 2024-07-03 NOTE — Progress Notes (Signed)
 "  BP 136/80   Pulse 85   Ht 5' 3 (1.6 m)   Wt 144 lb (65.3 kg)   SpO2 96%   BMI 25.51 kg/m    Subjective:   Patient ID: Kristen Munoz, female    DOB: September 12, 1945, 79 y.o.   MRN: [id number]  HPI: Kristen Munoz is a 79 y.o. female presenting on 07/03/2024 for Cough and Shortness of Breath   Discussed the use of AI scribe software for clinical note transcription with the patient, who gave verbal consent to proceed.  History of Present Illness   Kristen Munoz is a 79 year old female who presents with respiratory symptoms and pressure in her head and chest.  Respiratory symptoms - Sharp, shallow breathing since Christmas Eve, which was 10 days ago - Nasal discharge initially clear, now yellow - No fevers or chills - Current smoker, approximately 1.5 packs per day since her twenties - History of inhaler use, but no current inhalers available - Recent use of saline breathing treatment - Use of over-the-counter nasal spray for symptom relief  Head and chest pressure - Pressure in head and chest since Christmas Eve - Head described as feeling very large - Body aches, particularly in chest and back  Sleep disturbance and associated symptoms - Symptoms worsen at night and early morning - Sleep affected due to symptoms - Mouth breathing during sleep, resulting in dryness          Relevant past medical, surgical, family and social history reviewed and updated as indicated. Interim medical history since our last visit reviewed. Allergies and medications reviewed and updated.  Review of Systems  Constitutional:  Positive for chills. Negative for fever.  HENT:  Positive for congestion, postnasal drip and rhinorrhea. Negative for ear discharge, ear pain, sinus pressure, sneezing and sore throat.   Eyes:  Negative for pain, redness and visual disturbance.  Respiratory:  Positive for cough and wheezing. Negative for chest tightness and shortness of breath.   Cardiovascular:  Negative for  chest pain and leg swelling.  Genitourinary:  Negative for difficulty urinating and dysuria.  Musculoskeletal:  Positive for arthralgias and myalgias. Negative for back pain and gait problem.  Skin:  Negative for rash.  Neurological:  Negative for light-headedness and headaches.  Psychiatric/Behavioral:  Negative for agitation and behavioral problems.   All other systems reviewed and are negative.   Per HPI unless specifically indicated above   Allergies as of 07/03/2024       Reactions   Sulfa Antibiotics Nausea Only        Medication List        Accurate as of July 03, 2024  2:47 PM. If you have any questions, ask your nurse or doctor.          albuterol 108 (90 Base) MCG/ACT inhaler Commonly known as: VENTOLIN HFA Inhale 2 puffs into the lungs every 6 (six) hours as needed for wheezing or shortness of breath. Started by: Fonda Levins, MD   amoxicillin 500 MG capsule Commonly known as: AMOXIL Take 1 capsule (500 mg total) by mouth 2 (two) times daily. Started by: Fonda Levins, MD   aspirin EC 81 MG tablet Take 81 mg by mouth daily.   citalopram  40 MG tablet Commonly known as: CeleXA  Take 1 tablet (40 mg total) by mouth daily.   ferrous sulfate  325 (65 FE) MG tablet Take 1 tablet (325 mg total) by mouth daily with breakfast. (NEEDS TO BE SEEN BEFORE NEXT  REFILL)   levothyroxine  25 MCG tablet Commonly known as: Euthyrox  TAKE 1 TABLET BY MOUTH ONCE DAILY BEFORE BREAKFAST   lisinopril -hydrochlorothiazide  10-12.5 MG tablet Commonly known as: ZESTORETIC  TAKE 1 TABLET BY MOUTH ONCE DAILY   metoprolol  tartrate 25 MG tablet Commonly known as: LOPRESSOR  Take 1/2 (one-half) tablet by mouth twice daily   pantoprazole  40 MG tablet Commonly known as: PROTONIX  Take 1 tablet (40 mg total) by mouth daily. (NEEDS TO BE SEEN BEFORE NEXT REFILL)   predniSONE  20 MG tablet Commonly known as: DELTASONE  2 po at same time daily for 5 days Started by: Fonda Levins, MD   rosuvastatin  10 MG tablet Commonly known as: Crestor  Take 1 tablet (10 mg total) by mouth daily.         Objective:   BP 136/80   Pulse 85   Ht 5' 3 (1.6 m)   Wt 144 lb (65.3 kg)   SpO2 96%   BMI 25.51 kg/m   Wt Readings from Last 3 Encounters:  07/03/24 144 lb (65.3 kg)  03/06/24 146 lb (66.2 kg)  11/18/23 141 lb (64 kg)    Physical Exam Physical Exam   HEENT: Postnasal drainage present. CHEST: Wheezing and bronchi in lower lung fields. Bronchi in anterior lung fields. CARDIOVASCULAR: Heart rate regular.         Assessment & Plan:   Problem List Items Addressed This Visit   None Visit Diagnoses       Bronchitis    -  Primary   Relevant Medications   albuterol (VENTOLIN HFA) 108 (90 Base) MCG/ACT inhaler   amoxicillin (AMOXIL) 500 MG capsule   predniSONE  (DELTASONE ) 20 MG tablet     Mild intermittent reactive airway disease with acute exacerbation       Relevant Medications   albuterol (VENTOLIN HFA) 108 (90 Base) MCG/ACT inhaler   amoxicillin (AMOXIL) 500 MG capsule   predniSONE  (DELTASONE ) 20 MG tablet          Acute bronchitis (possible COPD) Symptoms suggest severe bronchitis with possible COPD due to smoking history. - Prescribed albuterol inhaler. - Prescribed amoxicillin. - Prescribed short course of prednisone . - Continue Tylenol and nasal sprays. - Advised to call if symptoms do not improve.  Nicotine dependence, cigarettes Current smoker with no desire to quit. - Discussed smoking cessation.  General Health Maintenance Advised to delay flu vaccination until recovery from current illness. - Advised to wait a couple of weeks to receive flu shot after recovery.          Follow up plan: Return if symptoms worsen or fail to improve.  Counseling provided for all of the vaccine components No orders of the defined types were placed in this encounter.   Fonda Levins, MD [facility] 07/03/2024, 2:47 PM     "

## 2024-07-03 NOTE — Telephone Encounter (Signed)
 Noted. Patient scheduled to be seen today for cough.

## 2024-07-14 ENCOUNTER — Telehealth: Payer: Self-pay | Admitting: Pharmacist

## 2024-07-14 ENCOUNTER — Telehealth: Payer: Self-pay | Admitting: Pharmacy Technician

## 2024-07-14 DIAGNOSIS — I10 Essential (primary) hypertension: Secondary | ICD-10-CM

## 2024-07-14 DIAGNOSIS — E782 Mixed hyperlipidemia: Secondary | ICD-10-CM

## 2024-07-14 MED ORDER — ROSUVASTATIN CALCIUM 10 MG PO TABS: 10.0000 mg | ORAL_TABLET | Freq: Every day | ORAL | 3 refills | Status: AC

## 2024-07-14 MED ORDER — LISINOPRIL-HYDROCHLOROTHIAZIDE 10-12.5 MG PO TABS: ORAL_TABLET | ORAL | 1 refills | Status: DC

## 2024-07-14 MED ORDER — ROSUVASTATIN CALCIUM 10 MG PO TABS: 10.0000 mg | ORAL_TABLET | Freq: Every day | ORAL | 1 refills | Status: DC

## 2024-07-14 MED ORDER — LISINOPRIL-HYDROCHLOROTHIAZIDE 10-12.5 MG PO TABS: ORAL_TABLET | ORAL | 3 refills | Status: AC

## 2024-07-14 NOTE — Telephone Encounter (Signed)
" ° °  This patient failed adherence measure for cholesterol (statin) and hypertension (ACEi/ARB) medications for 2025.  Will have CPhT reach out to see if there is an access problem with her cholesterol and hypertension medications.   Medication: Rosuvastatin , Lisinopril /HCTZ Last fill date for both: 02/12/24 for 90 day supply  Contacted pharmacy to facilitate refills. and Will collaborate with provider to facilitate refill needs.   Rosabell Geyer Dattero Cherlyn Syring, PharmD, BCACP, CPP Clinical Pharmacist, [facility]  "

## 2024-07-14 NOTE — Progress Notes (Signed)
" ° °  07/14/2024  Patient ID: Kristen Munoz, female   DOB: 1945/10/05, 79 y.o.   MRN: [id number]  Pharmacy Quality Measure Review  This patient failed the 2025 adherence measure for cholesterol (statin) medications this calendar year.   Medication: Rosuvastatin  Last fill date: 02/19/2024 for 90 day supply, previous fill was 09/22/2023 for 90 day supply  Left voicemail for patient to return my call at their convenience.  This patient failed the 2025  adherence measure for hypertension (ACEi/ARB) medications this calendar year.   Medication: Lisinopril  with Hydrochlorothiazide  Last fill date: 02/19/2024 for 90 day supply, previous fill was 09/22/2023 for 90 day supply  Left voicemail for patient to return my call at their convenience.  Cherise Fedder, CPhT [facility] Office: [phone] Email: [email or web address]    "

## 2024-07-15 ENCOUNTER — Telehealth: Payer: Self-pay

## 2024-07-15 DIAGNOSIS — M810 Age-related osteoporosis without current pathological fracture: Secondary | ICD-10-CM

## 2024-07-15 MED ORDER — DENOSUMAB 60 MG/ML ~~LOC~~ SOSY: 60.0000 mg | PREFILLED_SYRINGE | Freq: Once | SUBCUTANEOUS | Status: AC

## 2024-07-15 NOTE — Telephone Encounter (Signed)
 Prolia  sent for benefit verification

## 2024-07-16 ENCOUNTER — Telehealth: Payer: Self-pay | Admitting: Pharmacy Technician

## 2024-07-16 NOTE — Progress Notes (Signed)
" ° °  07/16/2024 Name: STARLEEN TRUSSELL MRN: [id number] DOB: 09-Aug-1945  Pharmacy Quality Measure Review  This patient is appearing on a report for being at risk of failing the adherence measure for hypertension (ACEi/ARB) medications this calendar year.   Medication: Lisinopril ?HCTZ Last fill date: 02/19/2024 for 90 day supply  This patient is appearing on a report for being at risk of failing the adherence measure for cholesterol (statin) medications this calendar year.   Medication: Rosuvastatin  Last fill date: 02/19/2024 for 90 day supply  Spoke with patient. HIPAA verified. Patient went through all of her maintenance medications that she takes. She informs she take Pantoprazole  daily, Levothyroxine  daily, Rosuvastatin  daily, Lisinopril /hydrochlorothiazide  daily and Metoprolol  twice a day. She informs she has full bottles of each of the medications. Patient informs she uses [facility] in [place] as her pharmacy. Patient informs she does NOT like to keep extra medication on hand.  Patient informs she needs Prolia  and that is the only medication she is in need of today.  However, the Rosuvastatin  and Lisinopril /hydrochlorothiazide  does not match with Dr Annemarie or [facility] information.  Contacted pharmacy to facilitate refills.Spoke to pharmacist at [facility]. The Rosuvastatin  and Lisinopril /HCTZ were filled on 07/14/2024 but have not been picked up. Per Pharmacist, these 2 prescriptions were filled in September but never picked up. Per Pharmacist, the prescriptions were last filled in August for 90 day supply. Pharmacist informs they do fill medications on patient's insurance.  Patient would benefit from a call from clinic PharmD.  Shauntavia Brackin, CPhT [facility] Office: [phone] Email: [email or web address]  "

## 2024-07-17 ENCOUNTER — Telehealth: Payer: Self-pay | Admitting: Pharmacist

## 2024-07-17 DIAGNOSIS — E782 Mixed hyperlipidemia: Secondary | ICD-10-CM

## 2024-07-17 DIAGNOSIS — I10 Essential (primary) hypertension: Secondary | ICD-10-CM

## 2024-07-17 NOTE — Telephone Encounter (Signed)
 Medication review needed.  Patient unsure of blood pressure medications and cholesterol.  Will review at PharmD appt.  [id number] placed.  Mehmet Scally Dattero Kaitrin Seybold, PharmD, BCACP, CPP Clinical Pharmacist, [facility]

## 2024-07-20 ENCOUNTER — Telehealth: Payer: Self-pay | Admitting: Pharmacist

## 2024-07-20 DIAGNOSIS — E782 Mixed hyperlipidemia: Secondary | ICD-10-CM

## 2024-07-20 NOTE — Telephone Encounter (Signed)
 [id number] placed to pharmacy for medication adherence.  Will continue to follow.

## 2024-07-22 ENCOUNTER — Other Ambulatory Visit (HOSPITAL_COMMUNITY): Payer: Self-pay

## 2024-07-22 ENCOUNTER — Telehealth: Payer: Self-pay

## 2024-07-22 NOTE — Telephone Encounter (Signed)
 Prolia  VOB initiated via [email or web address]  Next Prolia  inj DUE: 08/06/24

## 2024-07-23 ENCOUNTER — Other Ambulatory Visit (HOSPITAL_COMMUNITY): Payer: Self-pay

## 2024-07-23 NOTE — Telephone Encounter (Signed)
 Pt ready for scheduling for PROLIA  on or after : 08/06/24  Option# 1: Buy/Bill (Office supplied medication)  Out-of-pocket cost due at time of clinic visit: $352  Number of injection/visits approved: ---  Primary: [employer] Prolia  co-insurance: 20% Admin fee co-insurance: 0%  Secondary: --- Prolia  co-insurance:  Admin fee co-insurance:   Medical Benefit Details: Date Benefits were checked: 07/22/24 Deductible: NO/ Coinsurance: 20%/ Admin Fee: 0%  Prior Auth: N/A PA# Expiration Date:   # of doses approved: ----------------------------------------------------------------------- Option# 2- Med Obtained from pharmacy:  Pharmacy benefit: Copay [phone] (Paid to pharmacy) Admin Fee: 0% (Pay at clinic)  Prior Auth: N/A PA# Expiration Date:   # of doses approved:   If patient wants fill through the pharmacy benefit please send prescription to: [facility], and include estimated need by date in rx notes. Pharmacy will ship medication directly to the office.  Patient NOT eligible for Prolia  Copay Card. Copay Card can make patient's cost as little as $25. Link to apply: [email or web address]://[email or web address]  ** This summary of benefits is an estimation of the patient's out-of-pocket cost. Exact cost may very based on individual plan coverage.

## 2024-08-07 ENCOUNTER — Ambulatory Visit

## 2024-08-13 ENCOUNTER — Other Ambulatory Visit

## 2024-08-31 ENCOUNTER — Ambulatory Visit: Payer: Self-pay | Admitting: Nurse Practitioner
